# Patient Record
Sex: Male | Born: 1950 | Race: Black or African American | Hispanic: No | Marital: Married | Smoking: Former smoker
Health system: Southern US, Community
[De-identification: ages and names within clinical notes are randomized; demographics above are authoritative.]

## PROBLEM LIST (undated history)

## (undated) DIAGNOSIS — C61 Malignant neoplasm of prostate: Secondary | ICD-10-CM

## (undated) DIAGNOSIS — E785 Hyperlipidemia, unspecified: Secondary | ICD-10-CM

## (undated) DIAGNOSIS — G9332 Myalgic encephalomyelitis/chronic fatigue syndrome: Secondary | ICD-10-CM

## (undated) DIAGNOSIS — J449 Chronic obstructive pulmonary disease, unspecified: Secondary | ICD-10-CM

## (undated) DIAGNOSIS — R5382 Chronic fatigue, unspecified: Secondary | ICD-10-CM

## (undated) DIAGNOSIS — K219 Gastro-esophageal reflux disease without esophagitis: Secondary | ICD-10-CM

## (undated) DIAGNOSIS — I10 Essential (primary) hypertension: Secondary | ICD-10-CM

## (undated) DIAGNOSIS — L918 Other hypertrophic disorders of the skin: Secondary | ICD-10-CM

## (undated) DIAGNOSIS — M1990 Unspecified osteoarthritis, unspecified site: Secondary | ICD-10-CM

## (undated) HISTORY — DX: Gastro-esophageal reflux disease without esophagitis: K21.9

## (undated) HISTORY — PX: PROSTATE SURGERY: SHX751

## (undated) HISTORY — PX: HERNIA REPAIR: SHX51

## (undated) HISTORY — PX: KNEE SURGERY: SHX244

## (undated) HISTORY — DX: Hyperlipidemia, unspecified: E78.5

## (undated) HISTORY — PX: CATARACT EXTRACTION: SUR2

## (undated) HISTORY — DX: Unspecified osteoarthritis, unspecified site: M19.90

## (undated) HISTORY — DX: Myalgic encephalomyelitis/chronic fatigue syndrome: G93.32

## (undated) HISTORY — DX: Other hypertrophic disorders of the skin: L91.8

## (undated) HISTORY — PX: ANTERIOR CRUCIATE LIGAMENT REPAIR: SHX115

## (undated) HISTORY — DX: Chronic obstructive pulmonary disease, unspecified: J44.9

## (undated) HISTORY — DX: Essential (primary) hypertension: I10

## (undated) HISTORY — DX: Chronic fatigue, unspecified: R53.82

---

## 1998-02-05 ENCOUNTER — Encounter: Admission: RE | Admit: 1998-02-05 | Discharge: 1998-05-06 | Payer: Self-pay | Admitting: Radiation Oncology

## 1998-08-31 ENCOUNTER — Ambulatory Visit (HOSPITAL_COMMUNITY): Admission: RE | Admit: 1998-08-31 | Discharge: 1998-08-31 | Payer: Self-pay | Admitting: Gastroenterology

## 1999-09-05 ENCOUNTER — Ambulatory Visit (HOSPITAL_COMMUNITY): Admission: RE | Admit: 1999-09-05 | Discharge: 1999-09-05 | Payer: Self-pay | Admitting: Internal Medicine

## 1999-09-05 ENCOUNTER — Encounter: Payer: Self-pay | Admitting: Internal Medicine

## 2000-03-09 ENCOUNTER — Encounter: Payer: Self-pay | Admitting: Internal Medicine

## 2000-03-09 ENCOUNTER — Ambulatory Visit (HOSPITAL_COMMUNITY): Admission: RE | Admit: 2000-03-09 | Discharge: 2000-03-09 | Payer: Self-pay | Admitting: Internal Medicine

## 2000-03-11 ENCOUNTER — Encounter: Payer: Self-pay | Admitting: Internal Medicine

## 2000-03-11 ENCOUNTER — Encounter: Admission: RE | Admit: 2000-03-11 | Discharge: 2000-03-11 | Payer: Self-pay | Admitting: Internal Medicine

## 2001-01-12 ENCOUNTER — Encounter: Payer: Self-pay | Admitting: Internal Medicine

## 2001-01-12 ENCOUNTER — Encounter: Admission: RE | Admit: 2001-01-12 | Discharge: 2001-01-12 | Payer: Self-pay | Admitting: Internal Medicine

## 2001-03-05 ENCOUNTER — Encounter: Payer: Self-pay | Admitting: Urology

## 2001-03-05 ENCOUNTER — Encounter: Admission: RE | Admit: 2001-03-05 | Discharge: 2001-03-05 | Payer: Self-pay | Admitting: Urology

## 2001-03-12 ENCOUNTER — Ambulatory Visit: Admission: RE | Admit: 2001-03-12 | Discharge: 2001-06-10 | Payer: Self-pay | Admitting: *Deleted

## 2001-03-18 ENCOUNTER — Ambulatory Visit (HOSPITAL_COMMUNITY): Admission: RE | Admit: 2001-03-18 | Discharge: 2001-03-18 | Payer: Self-pay | Admitting: Gastroenterology

## 2001-04-19 ENCOUNTER — Encounter: Payer: Self-pay | Admitting: Urology

## 2001-04-26 ENCOUNTER — Inpatient Hospital Stay (HOSPITAL_COMMUNITY): Admission: RE | Admit: 2001-04-26 | Discharge: 2001-04-29 | Payer: Self-pay | Admitting: Urology

## 2001-04-26 ENCOUNTER — Encounter (INDEPENDENT_AMBULATORY_CARE_PROVIDER_SITE_OTHER): Payer: Self-pay

## 2001-06-23 ENCOUNTER — Encounter: Payer: Self-pay | Admitting: Internal Medicine

## 2001-06-23 ENCOUNTER — Encounter: Admission: RE | Admit: 2001-06-23 | Discharge: 2001-06-23 | Payer: Self-pay | Admitting: Internal Medicine

## 2001-12-13 ENCOUNTER — Encounter: Payer: Self-pay | Admitting: Internal Medicine

## 2001-12-13 ENCOUNTER — Encounter: Admission: RE | Admit: 2001-12-13 | Discharge: 2001-12-13 | Payer: Self-pay | Admitting: Internal Medicine

## 2002-01-26 ENCOUNTER — Encounter: Admission: RE | Admit: 2002-01-26 | Discharge: 2002-01-26 | Payer: Self-pay | Admitting: Internal Medicine

## 2002-01-26 ENCOUNTER — Encounter: Payer: Self-pay | Admitting: Internal Medicine

## 2002-01-26 ENCOUNTER — Encounter: Admission: RE | Admit: 2002-01-26 | Discharge: 2002-01-26 | Payer: Self-pay | Admitting: Otolaryngology

## 2002-01-26 ENCOUNTER — Encounter: Payer: Self-pay | Admitting: Otolaryngology

## 2002-03-14 ENCOUNTER — Encounter: Payer: Self-pay | Admitting: Orthopedic Surgery

## 2002-03-14 ENCOUNTER — Encounter: Admission: RE | Admit: 2002-03-14 | Discharge: 2002-03-14 | Payer: Self-pay | Admitting: Orthopedic Surgery

## 2002-06-08 ENCOUNTER — Emergency Department (HOSPITAL_COMMUNITY): Admission: EM | Admit: 2002-06-08 | Discharge: 2002-06-08 | Payer: Self-pay | Admitting: Emergency Medicine

## 2002-06-08 ENCOUNTER — Encounter: Payer: Self-pay | Admitting: Emergency Medicine

## 2002-12-09 ENCOUNTER — Encounter: Payer: Self-pay | Admitting: Internal Medicine

## 2002-12-09 ENCOUNTER — Encounter: Admission: RE | Admit: 2002-12-09 | Discharge: 2002-12-09 | Payer: Self-pay | Admitting: Internal Medicine

## 2003-01-02 ENCOUNTER — Emergency Department (HOSPITAL_COMMUNITY): Admission: AD | Admit: 2003-01-02 | Discharge: 2003-01-02 | Payer: Self-pay | Admitting: Family Medicine

## 2003-05-18 ENCOUNTER — Encounter: Admission: RE | Admit: 2003-05-18 | Discharge: 2003-05-18 | Payer: Self-pay | Admitting: Internal Medicine

## 2003-07-26 ENCOUNTER — Emergency Department (HOSPITAL_COMMUNITY): Admission: EM | Admit: 2003-07-26 | Discharge: 2003-07-26 | Payer: Self-pay | Admitting: Family Medicine

## 2003-09-01 ENCOUNTER — Encounter: Admission: RE | Admit: 2003-09-01 | Discharge: 2003-09-01 | Payer: Self-pay | Admitting: Internal Medicine

## 2004-08-15 ENCOUNTER — Observation Stay (HOSPITAL_COMMUNITY): Admission: EM | Admit: 2004-08-15 | Discharge: 2004-08-16 | Payer: Self-pay | Admitting: Emergency Medicine

## 2004-09-12 ENCOUNTER — Inpatient Hospital Stay (HOSPITAL_BASED_OUTPATIENT_CLINIC_OR_DEPARTMENT_OTHER): Admission: RE | Admit: 2004-09-12 | Discharge: 2004-09-12 | Payer: Self-pay | Admitting: Cardiology

## 2005-01-24 ENCOUNTER — Encounter: Admission: RE | Admit: 2005-01-24 | Discharge: 2005-01-24 | Payer: Self-pay | Admitting: Internal Medicine

## 2005-05-15 ENCOUNTER — Encounter: Admission: RE | Admit: 2005-05-15 | Discharge: 2005-05-15 | Payer: Self-pay | Admitting: Sports Medicine

## 2005-08-11 ENCOUNTER — Encounter: Admission: RE | Admit: 2005-08-11 | Discharge: 2005-08-11 | Payer: Self-pay | Admitting: Internal Medicine

## 2005-09-11 ENCOUNTER — Ambulatory Visit (HOSPITAL_COMMUNITY): Admission: RE | Admit: 2005-09-11 | Discharge: 2005-09-11 | Payer: Self-pay | Admitting: Urology

## 2005-10-09 ENCOUNTER — Encounter (HOSPITAL_COMMUNITY): Admission: RE | Admit: 2005-10-09 | Discharge: 2005-12-23 | Payer: Self-pay | Admitting: Urology

## 2005-11-25 ENCOUNTER — Encounter: Admission: RE | Admit: 2005-11-25 | Discharge: 2005-11-25 | Payer: Self-pay | Admitting: Internal Medicine

## 2006-07-13 ENCOUNTER — Encounter: Admission: RE | Admit: 2006-07-13 | Discharge: 2006-07-13 | Payer: Self-pay | Admitting: Gastroenterology

## 2007-03-30 ENCOUNTER — Emergency Department (HOSPITAL_COMMUNITY): Admission: EM | Admit: 2007-03-30 | Discharge: 2007-03-30 | Payer: Self-pay | Admitting: Emergency Medicine

## 2007-04-12 ENCOUNTER — Ambulatory Visit (HOSPITAL_COMMUNITY): Admission: RE | Admit: 2007-04-12 | Discharge: 2007-04-12 | Payer: Self-pay | Admitting: Cardiology

## 2007-04-13 ENCOUNTER — Emergency Department (HOSPITAL_COMMUNITY): Admission: EM | Admit: 2007-04-13 | Discharge: 2007-04-13 | Payer: Self-pay | Admitting: Family Medicine

## 2007-04-15 ENCOUNTER — Encounter: Admission: RE | Admit: 2007-04-15 | Discharge: 2007-04-15 | Payer: Self-pay | Admitting: Internal Medicine

## 2008-01-13 ENCOUNTER — Encounter (HOSPITAL_COMMUNITY): Admission: RE | Admit: 2008-01-13 | Discharge: 2008-03-30 | Payer: Self-pay | Admitting: Urology

## 2008-04-12 ENCOUNTER — Ambulatory Visit: Payer: Self-pay | Admitting: Vascular Surgery

## 2008-04-14 ENCOUNTER — Encounter: Admission: RE | Admit: 2008-04-14 | Discharge: 2008-04-14 | Payer: Self-pay | Admitting: Internal Medicine

## 2008-04-30 ENCOUNTER — Emergency Department (HOSPITAL_COMMUNITY): Admission: EM | Admit: 2008-04-30 | Discharge: 2008-04-30 | Payer: Self-pay | Admitting: Emergency Medicine

## 2008-10-06 ENCOUNTER — Emergency Department (HOSPITAL_COMMUNITY): Admission: EM | Admit: 2008-10-06 | Discharge: 2008-10-07 | Payer: Self-pay | Admitting: Emergency Medicine

## 2008-10-07 ENCOUNTER — Ambulatory Visit: Payer: Self-pay | Admitting: Diagnostic Radiology

## 2008-10-07 ENCOUNTER — Emergency Department (HOSPITAL_BASED_OUTPATIENT_CLINIC_OR_DEPARTMENT_OTHER): Admission: EM | Admit: 2008-10-07 | Discharge: 2008-10-07 | Payer: Self-pay | Admitting: Emergency Medicine

## 2008-11-17 ENCOUNTER — Encounter: Admission: RE | Admit: 2008-11-17 | Discharge: 2008-11-17 | Payer: Self-pay | Admitting: Internal Medicine

## 2009-02-05 ENCOUNTER — Ambulatory Visit (HOSPITAL_COMMUNITY): Admission: RE | Admit: 2009-02-05 | Discharge: 2009-02-06 | Payer: Self-pay | Admitting: General Surgery

## 2009-05-07 ENCOUNTER — Encounter: Admission: RE | Admit: 2009-05-07 | Discharge: 2009-05-07 | Payer: Self-pay | Admitting: Internal Medicine

## 2009-06-05 ENCOUNTER — Encounter: Admission: RE | Admit: 2009-06-05 | Discharge: 2009-06-05 | Payer: Self-pay | Admitting: Internal Medicine

## 2009-06-26 ENCOUNTER — Ambulatory Visit: Payer: Self-pay | Admitting: Vascular Surgery

## 2009-06-26 ENCOUNTER — Encounter: Payer: Self-pay | Admitting: Internal Medicine

## 2009-06-26 ENCOUNTER — Ambulatory Visit (HOSPITAL_COMMUNITY): Admission: RE | Admit: 2009-06-26 | Discharge: 2009-06-26 | Payer: Self-pay | Admitting: Internal Medicine

## 2009-11-21 ENCOUNTER — Emergency Department (HOSPITAL_COMMUNITY): Admission: EM | Admit: 2009-11-21 | Discharge: 2009-11-22 | Payer: Self-pay | Admitting: Emergency Medicine

## 2010-02-06 ENCOUNTER — Encounter: Admission: RE | Admit: 2010-02-06 | Discharge: 2010-02-06 | Payer: Self-pay | Admitting: Otolaryngology

## 2010-03-14 ENCOUNTER — Encounter
Admission: RE | Admit: 2010-03-14 | Discharge: 2010-03-14 | Payer: Self-pay | Source: Home / Self Care | Attending: Internal Medicine | Admitting: Internal Medicine

## 2010-06-14 LAB — COMPREHENSIVE METABOLIC PANEL
Albumin: 4 g/dL (ref 3.5–5.2)
Alkaline Phosphatase: 78 U/L (ref 39–117)
BUN: 17 mg/dL (ref 6–23)
GFR calc Af Amer: >60 mL/min (ref >60)
Potassium: 3.6 mEq/L (ref 3.5–5.1)
Sodium: 139 mEq/L (ref 135–145)
Total Protein: 7.5 g/dL (ref 6.0–8.3)

## 2010-06-14 LAB — URINALYSIS, ROUTINE W REFLEX MICROSCOPIC
Glucose, UA: NEGATIVE mg/dL
Ketones, ur: NEGATIVE mg/dL
pH: 6.5 (ref 5.0–8.0)

## 2010-06-14 LAB — DIFFERENTIAL
Basophils Relative: 0 % (ref 0–1)
Monocytes Absolute: 0.5 10*3/uL (ref 0.1–1.0)
Monocytes Relative: 8 % (ref 3–12)
Neutro Abs: 5.2 10*3/uL (ref 1.7–7.7)

## 2010-06-14 LAB — CBC
Platelets: 212 10*3/uL (ref 150–400)
RDW: 13.2 % (ref 11.5–15.5)
WBC: 6.2 10*3/uL (ref 4.0–10.5)

## 2010-07-03 LAB — CBC
HCT: 43 % (ref 39.0–52.0)
Hemoglobin: 15.1 g/dL (ref 13.0–17.0)
RBC: 4.72 MIL/uL (ref 4.22–5.81)
WBC: 3.7 10*3/uL — ABNORMAL LOW (ref 4.0–10.5)

## 2010-07-03 LAB — COMPREHENSIVE METABOLIC PANEL
Alkaline Phosphatase: 78 U/L (ref 39–117)
BUN: 11 mg/dL (ref 6–23)
CO2: 30 mEq/L (ref 19–32)
Chloride: 104 mEq/L (ref 96–112)
Glucose, Bld: 100 mg/dL — ABNORMAL HIGH (ref 70–99)
Potassium: 4.1 mEq/L (ref 3.5–5.1)
Total Bilirubin: 0.7 mg/dL (ref 0.3–1.2)

## 2010-07-03 LAB — DIFFERENTIAL
Basophils Absolute: 0 10*3/uL (ref 0.0–0.1)
Eosinophils Absolute: 0.2 10*3/uL (ref 0.0–0.7)
Lymphocytes Relative: 28 % (ref 12–46)
Monocytes Absolute: 0.3 10*3/uL (ref 0.1–1.0)
Neutrophils Relative %: 56 % (ref 43–77)

## 2010-07-03 LAB — PROTIME-INR
INR: 0.95 (ref 0.00–1.49)
Prothrombin Time: 12.6 seconds (ref 11.6–15.2)

## 2010-07-03 LAB — APTT: aPTT: 28 seconds (ref 24–37)

## 2010-07-07 LAB — URINALYSIS, ROUTINE W REFLEX MICROSCOPIC
Bilirubin Urine: NEGATIVE
Ketones, ur: NEGATIVE mg/dL
Nitrite: NEGATIVE
Protein, ur: NEGATIVE mg/dL
pH: 7 (ref 5.0–8.0)

## 2010-07-07 LAB — DIFFERENTIAL
Basophils Absolute: 0 10*3/uL (ref 0.0–0.1)
Eosinophils Relative: 1 % (ref 0–5)
Lymphocytes Relative: 12 % (ref 12–46)
Lymphs Abs: 0.6 10*3/uL — ABNORMAL LOW (ref 0.7–4.0)
Neutro Abs: 4.4 10*3/uL (ref 1.7–7.7)
Neutrophils Relative %: 82 % — ABNORMAL HIGH (ref 43–77)

## 2010-07-07 LAB — CBC
Platelets: 238 10*3/uL (ref 150–400)
RDW: 12.2 % (ref 11.5–15.5)
WBC: 5.3 10*3/uL (ref 4.0–10.5)

## 2010-07-07 LAB — BASIC METABOLIC PANEL
BUN: 11 mg/dL (ref 6–23)
Calcium: 8.9 mg/dL (ref 8.4–10.5)
Creatinine, Ser: 0.8 mg/dL (ref 0.4–1.5)
GFR calc non Af Amer: >60 mL/min (ref >60)
Glucose, Bld: 126 mg/dL — ABNORMAL HIGH (ref 70–99)

## 2010-07-07 LAB — POCT CARDIAC MARKERS
CKMB, poc: 1.4 ng/mL (ref 1.0–8.0)
Myoglobin, poc: 63 ng/mL (ref 12–200)
Troponin i, poc: <0.05 ng/mL (ref 0.00–0.09)

## 2010-07-10 ENCOUNTER — Encounter: Payer: Self-pay | Admitting: Internal Medicine

## 2010-08-16 NOTE — Op Note (Signed)
[facility]  Patient:    Curtis Bell, Curtis Bell Visit Number: [id number] MRN: [id number]          Service Type: SUR Location: 3W 0376 01 Attending Physician:  Laqueta Jean Dictated by:   Vonzell Schlatter Patsi Sears, M.D. Proc. Date: 04/26/01 Admit Date:  04/26/2001   CC:         Curtis Bell, M.D.  Lindaann Slough, M.D.   Operative Report  PREOPERATIVE DIAGNOSIS:  Adenocarcinoma of the prostate.  POSTOPERATIVE DIAGNOSIS:  Adenocarcinoma of the prostate.  OPERATION PERFORMED:  Radical retropubic prostatectomy (nerve sparing type).  SURGEON:  Sigmund I. Patsi Sears, M.D.  FIRST ASSISTANT:  Lindaann Slough, M.D.  PREPARATION:  After appropriate preanesthesia, the patient was brought to the operating room, placed upon the operating table in dorsal supine position where general endotracheal anesthesia was introduced. He remained in the supine position, where the abdomen and pubis were prepped with Betadine solution and draped in the usual fashion.  REVIEW OF HISTORY:  Curtis Bell is a 60 year old black male, with a history of PSA of 7.84, ______ 45.18 cc, and biopsy of the prostate showing adenocarcinoma of the prostate, Gleason 3 + 4 (on the left side). The patient could not tolerate discomfort of biopsies (despite local anesthesia), and therefore biopsies on the right side were not obtained. The patient has negative bone scan, negative CT scan, and has selected radical retropubic prostatectomy, nerve sparing type as treatment of choice.  DESCRIPTION OF PROCEDURE:  A 20 cm midline was made, subcutaneous tissue was dissected with the electrosurgical unit. The right and left pelvic gutters are dissected, and lymph node packages from the right and left obturator fossa are dissected, by dissecting tissue from the external iliac vein, lateralward to the pelvic side wall, inferiorly to the obturator nerve, and distalward to the femoral canal. On the  right side, the tissue was somewhat stuck, and on the left side, a large lymph node was identified. This tissue was dissected and sent to the laboratory for evaluation by permanent section analysis.  The retropubic fascia was then dissected with sharp and blunt dissection, and the puboprostatic ligaments were identified and sharply taken down. Using the Hohenfelner clamp, the dorsal vein complex was dissected from the urethra, and two #0 Vicryl sutures were placed. The Stamey tractor was then placed and a #0 Vicryl suture ligature was placed through the distal portion of the venous vasculature, and then the Hohenfelner was placed, and the suture brought through and ligated distalward. Using the [facility], bladder neck was then incised at the level of the urethra. The large Kelly clamp was then used to clamp the Foley catheter, and the catheter was brought into the wound and used as a traction device. The posterior portion of the Foley catheter was then incised. A plane was then dissected ______, and the vas was identified bilaterally and dissected down to show the seminal vesicles. The vas was ligated on the right side with #0 chromic catgut suture, on the left side with #0 Prolene catgut suture. The seminal vesicles were then dissected bilaterally and clipped at the base and cut.  The bladder neck was then incised and dissected with a Vanderbilt clamp, and after dissection, incised. The prostatectomy was then completed and the tissue removed and a portion of the posterior bladder neck was sent for separate analysis. Dissection was somewhat difficult to do because of grandular response on the left side. It was felt that the nerves had been spared, however, bilaterally.  The bladder neck was then tightened with a single 3-0 Vicryl suture in a tennis racquet fashion at 6 oclock, and following this, the bladder neck was everted with 4-0 Vicryl suture. A 6 suture anastomosis was then  accomplished with 2-0 monocryl suture. Sutures were placed at the 6 oclock, 8 oclock, 4 oclock, 10 oclock, 2 oclock, and 12 oclock positions. Following suture placement, the 22 Foley catheter was placed in the bladder with 20 cc. The bladder neck sutures were then placed prior to inflating the balloon, and following placement, the balloon was inflated. The ligatures were then accomplished, and irrigation was accomplished, which revealed some clots which were irrigated free from the bladder, but no leakage was noted. A separate Blake drain was placed through the left pelvis, and sutured in place with 3-0 nylon suture.  The wound was injected with Marcaine solution, and following this, the patient underwent closure with a single #1 PDS suture in running fashion. The skin was closed with a skin stapler. A sterile dressing was applied, the patient was awakened and taken to the recovery room in good condition. Dictated by:   Vonzell Schlatter Patsi Sears, M.D. Attending Physician:  Laqueta Jean DD:  04/26/01 TD:  04/26/01 Job: [id number]

## 2010-08-16 NOTE — Discharge Summary (Signed)
[facility]  Patient:    Curtis Bell, Curtis Bell Visit Number: [id number] MRN: [id number]          Service Type: SUR Location: 3W 0376 01 Attending Physician:  Laqueta Jean Dictated by:   Vonzell Schlatter Curtis Bell, M.D. Admit Date:  04/26/2001 Discharge Date: 04/29/2001                             Discharge Summary  FINAL DIAGNOSIS:  Adenocarcinoma of the prostate, Gleason score 7 ( 3 + 4), stage T2C, PN0, PMX.  OPERATION:  On January 27, radical retropubic prostatectomy (nerve-sparing type).  HISTORY OF PRESENT ILLNESS:  Curtis Bell is a 60 year old married black male, admitted to the operating room for radical retropubic prostatectomy for a Gleason 7 adenocarcinoma of the prostate.  The patient has a urologic history of Peyronies disease, epididymitis, and bladder neck incision in 1997, with negative prostate biopsies.  More recently, the PSA was noted to be 7.84, volume of 45.18 cc, density of 0.17, with biopsies on the left side only because of pain during the prostate biopsy process (despite local anesthetic).  Pathologic evaluation showed Gleason 3 + 4 adenocarcinoma of the prostate, and with negative bone scan and negative CT scan, the patient has elected to have radical retropubic prostatectomy, nerve sparing type.  PAST MEDICAL HISTORY:  Noncontributory.   Alcohol - none.  Tobacco - none.  ALLERGIES:  None.  MEDICATIONS: 1. Lopressor 50 mg one-half tablet b.i.d. 2. Norvasc 2.5 mg per day. 3. Celebrex 200 mg per day. 4. Nexium one per day. 5. Nasonex p.r.n. 6. Multivitamins.  ADMISSION PHYSICAL EXAMINATION:  GENERAL:  Thin, well-developed black male in no acute distress.  VITAL SIGNS:  Temperature 97.6, heart rate 74, respiratory rate 14, blood pressure 128/84.  ADMISSION LABORATORY DATA:  The patient has an admission hemoglobin of 17.2, hematocrit 50.2, with BUN of 8 and creatinine 0.9.  HOSPITAL COURSE:  On the day of  admission, the patient underwent radical retropubic prostatectomy, nerve-sparing type.  Pathologic evaluation shows PT2C, PN0, PMX, with 0 of 9 lymph nodes found with disesae.  The patient had tumor coming extremely close to the left apical margin.  The right lobe of the prostate shows only a rare microscopic focus.  In the vast majority of the tumor, it was on the left side of the prostate.  The patient did well following surgery.  A Jackson-Pratt catheter was removed on the day of discharge.  He was discharged on pain medication, antibiotic. He will return to the office for staple removal.  He is discharged following the bowel movement, in stable condition. Dictated by:   Vonzell Schlatter Curtis Bell, M.D. Attending Physician:  Laqueta Jean DD:  04/29/01 TD:  04/29/01 Job: [id number]

## 2010-08-16 NOTE — H&P (Signed)
[facility]  Patient:    Curtis Bell, Curtis Bell Visit Number: [id number] MRN: [id number]          Service Type: SUR Location: 1S X007 01 Attending Physician:  Laqueta Jean Dictated by:   Vonzell Schlatter Patsi Sears, M.D. Admit Date:  04/26/2001   CC:         Eric L. August Saucer, M.D.   History and Physical  HISTORY OF PRESENT ILLNESS:  The patient is a 60 year old married male, who was admitted via the operating room today for radical retropubic prostatectomy for Gleason 7 adenocarcinoma of the prostate.  The patient has a urologic history of perinis disease, epididymitis, bladder, neck incision 1997, with negative prostate biopsies.  More recently, his PSA was noted to be 7.84, with a volume of 45.18 cc. and a density of 0.17. Prostate biopsied were obtained from the left side only because of pain during prostate biopsy, despite local anesthetic.  Pathologic evaluation showed Gleason score 3 plus 4 (7) adenocarcinoma and 25% of the left apex biopsies, the left central biopsies, and the left lateral biopsies.  The bone scan was negative, and CT showed a right lower pole renal cyst only. His prostate was noted to be moderately enlarged, measuring 45 cc., with prominent seminal vesicles but no evidence of any local tumor extension.  The bladder was slightly thick walled suggesting some outlet obstruction.  Following along the discussion with regard to all types of therapy, the patient elected to have radical retropubic prostatectomy and was admitted via the operating room for that today.  PAST MEDICAL HISTORY:  Otherwise noncontributory.  The alcohol was none. Tobacco was none.  ALLERGIES:  No known drug allergies.  MEDICATIONS:  None.  ADMISSION PHYSICAL EXAMINATION:  GENERAL:  Shows a well-developed black male in no acute distress.  HEENT:  PERRL.  EOM full.  NECK:  Supple.  Nontender.  No nodes.  CHEST:  Clear to percussion and  auscultation.  ABDOMEN:  Soft, plus bowel sounds without organomegaly or masses.  GENITALIA:  Normal male, external genitalia.  RECTAL:  Shows a 3+ lobular benign prostate.  EXTREMITIES: without cyanosis or edema.  NEUROLOGIC:  Physiologic.  VITAL SIGNS:  Within normal limits.  ADMITTING IMPRESSION:  Adenocarcinoma of the prostate.  PLAN:  Admit OR radical retropubic prostatectomy. Dictated by:   Vonzell Schlatter Patsi Sears, M.D. Attending Physician:  Laqueta Jean DD:  04/26/01 TD:  04/26/01 Job: [id number]

## 2010-08-16 NOTE — Discharge Summary (Signed)
NAMESAAGAR, Curtis Bell               ACCOUNT NO.:  [id number]   MEDICAL RECORD NO.:  [id number]          PATIENT TYPE:  INP   LOCATION:  2033                         FACILITY:  [facility]   PHYSICIAN:  Eduardo Osier. Sharyn Lull, M.D. DATE OF BIRTH:  03-May-1950   DATE OF ADMISSION:  08/14/2004  DATE OF DISCHARGE:  08/16/2004                                 DISCHARGE SUMMARY   ADMISSION DIAGNOSES:  1.  New onset angina, rule out myocardial infarction.  2.  Hypertension.  3.  Elevated blood sugar, rule out diabetes mellitus.  4.  Remote history of tobacco abuse.   FINAL DIAGNOSES:  1.  Status post chest pain with myocardial infarction ruled out. Negative      stress Myoview with ejection fraction of 53%.  2.  Elevated blood sugar, rule out diabetes mellitus.  3.  Hypercholesterolemia.  4.  Remote history of tobacco abuse.  5.  Hypertension.   DISCHARGE MEDICATIONS:  1.  Norvasc 5 mg 1 tablet daily.  2.  Nexium 40 mg 1 capsule daily half hour before breakfast.  3.  Baby aspirin 81 mg 2 tablets daily.  4.  Nitrostat 0.4 mg sublingual use as directed.  5.  Lipitor 10 mg 1 tablet p.o. daily.   ACTIVITY:  As tolerated.   DIET:  Low salt, low cholesterol, 1800 calorie ADA diet. The patient has  been advised to avoid sweets.   FOLLOW UP:  Follow up with me in one week and Dr. Minerva Areola L. Dean in two weeks.   CONDITION ON DISCHARGE:  Stable.   BRIEF HISTORY:  Curtis Bell is a 60 year old black male with past medical  history significant for hypertension, sinus problem, GERD. He came to the ER  via EMS complaining of recurrent retrosternal chest pain off and on since  yesterday afternoon. The chest pain was described as pressure, localized  grade 8 over 10 lasting a few minutes without associated symptoms of nausea,  vomiting, or diaphoresis. Denies any palpitation, lightheadedness or  syncope. Denies any shortness of breath. 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   An EKG done in the ER showed normal sinus rhythm with minor ST and T-wave  changes in the lateral leads which were noted also in prior EKGs. There were  no new acute ischemic changes noted. The patient denies any relational chest  pain to food, breathing, or movement.   PAST MEDICAL HISTORY:  As above.   PAST SURGICAL HISTORY:  1.  He had right knee surgery in the past.  2.  Prostatectomy for CA of the prostate in the past.   SOCIAL HISTORY:  He is married and has two children. Smoked one pack per day  for 10+ years. Quit in 1984. No history of drug abuse. 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   FAMILY HISTORY:  Negative for coronary artery disease.   MEDICATIONS:  Norvasc, Nexium, and questionable medication for his lung  problem.  ALLERGIES:  No known drug allergies.   PHYSICAL EXAMINATION:  GENERAL:  He was alert, oriented x 3 in no acute  distress.  VITAL SIGNS:  Blood pressure is 134/84, pulse was 59.  HEENT:  Conjunctivae was pink.  NECK:  Supple. No JVD and no bruit.  LUNGS:  Clear to auscultation without rhonchi or rales.  CARDIOVASCULAR:  S1 and S2 was normal. There was no S3, gallop or murmur.  ABDOMEN:  Soft. Bowel sounds were present. Nontender.  EXTREMITIES:  There is no clubbing, cyanosis, or edema.   LABORATORY DATA:  Cholesterol was 175, LDL was 109, HDL was 37. His CPK was  367, MB 6.5, relative index 1.8. Second set of CPK 285, MB 5.0, relative  index 1.8. Third set of CPK 250, MB 3.0, relative index was 1.2. Troponin I  three sets were negative, 0.2, 101, and 0.02. His fasting blood sugar was  113; fasting repeat blood sugar was 102. This morning, blood sugar was 118  which is slightly elevated. Sodium was 136, potassium 3.6, bicarbonate 25,  BUN was 9, creatinine 0.8. Calcium was 8.5.   Stress Myoview done today showed no evidence of ischemia with EF of 56%.   Chest x-ray showed no acute cardiopulmonary disease.   HOSPITAL COURSE:  The patient was admitted to telemetry unit. MI was ruled  out by serial enzymes and EKG. The patient was started on IV nitrates and  heparin and low dose beta blocker. The patient has episode of bradycardia  with heart rate in the 40s. Beta blockers were stopped. The patient's heart  rate remained in the 60s after stopping the beta blocker. The patient  underwent stress Myoview, exercised for seven minutes on Bruce protocol. No  chest pain was reported. Peak heart rate was 154. Peak blood pressure was  213/74. EKG showed sinus bradycardia with slightly upsloping, ST depression  in inferolateral leads but in the recovery there were mild ST depression,  horizontal and downgoing in the recovery phase which reverted towards the  baseline. Myoview results showed no evidence of reversible ischemia with EF  of 56%. The patient did not have any further episodes of chest pain during  the hospital  stay. The patient will be discharged home on the above medications and will  be followed up in my office in one week and Dr. Minerva Areola L. Dean in two weeks.  The patient will be monitored for his mildly elevated blood sugar as  outpatient and we will get hemoglobin A1C and glucose tolerance test as  appropriate.      MNH/MEDQ  D:  08/16/2004  T:  08/17/2004  Job:  [id number]   cc:   [name], M.D.  [address] [address]  [place]  [place] [address]  Fax: [phone]

## 2010-08-16 NOTE — Procedures (Signed)
[facility]  Patient:    Curtis Bell Visit Number: [id number] MRN: [id number]          Service Type: END Location: ENDO Attending Physician:  Nelda Marseille Dictated by:   Petra Kuba, M.D. Proc. Date: 03/18/01 Admit Date:  03/18/2001 Discharge Date: 03/18/2001   CC:         [name], M.D.  Sigmund I. Patsi Sears, M.D.   Procedure Report  PROCEDURE:  Colonoscopy.  INDICATION:  Patient with persistent anorectal complaints, probably due to hemorrhoids but with a recent diagnosis of prostate cancer.  Want to rule out any rectal abnormality.  Consent was signed after risks, benefits, methods and options thoroughly discussed in the office.  MEDICATIONS:  Demerol 80, Versed 10.  DESCRIPTION OF PROCEDURE:  Rectal inspection was pertinent for external hemorrhoids, small.  Digital exam was negative.  The video colonoscope was inserted.  The rectum was thoroughly evaluated on straight and retroflexed visualization and other than some small internal hemorrhoids, no abnormalities were seen.  Anorectal pull-through confirmed the above.  The scope was then straightened and easily advanced around the colon to the cecum.  This did require some abdominal pressure and rolling him on his back.  The cecum was identified by the appendiceal orifice and the ileocecal valve.  No abnormalities were seen on insertion.  The scope was slowly withdrawn.  The prep was adequate.  There was some liquid stool that required washing and suctioning.  On slow withdrawal through the colon, no polypoid lesions, masses, or diverticula were seen as we slowly withdrew back to the rectum. Again anorectal pull-through, retroflexion, and straight visualization were all normal except for the hemorrhoids.  The scope was straightened and readvanced a short ways up the sigmoid.  Air was suctioned and the scope removed.  The patient tolerated the procedure well.  There was no  obvious immediate complication.  ENDOSCOPIC DIAGNOSES: 1. Internal/external hemorrhoids. 2. Otherwise within normal limits to the cecum.  PLAN:  Hemorrhoidal therapy p.r.n. with the use of suppositories, creams, or foams.  Okay from my standpoint for a prostate surgery.  Be happy to see back p.r.n.  Otherwise repeat screening in five years, but yearly rectals and guaiacs per Dr. August Saucer. Dictated by:   Petra Kuba, M.D. Attending Physician:  Nelda Marseille DD:  03/18/01 TD:  03/20/01 Job: [id number]

## 2010-08-16 NOTE — Cardiovascular Report (Signed)
NAMEOMRI, BERTRAN               ACCOUNT NO.:  [id number]   MEDICAL RECORD NO.:  [id number]          PATIENT TYPE:  OIB   LOCATION:  6501                         FACILITY:  [facility]   PHYSICIAN:  Mohan N. Sharyn Lull, M.D. DATE OF BIRTH:  1950-10-27   DATE OF PROCEDURE:  09/12/2004  DATE OF DISCHARGE:                              CARDIAC CATHETERIZATION   PROCEDURE:  Left cardiac cath with selective left and right coronary  angiograph via LV graft via right groin using Judkins technique.   INDICATIONS FOR PROCEDURE:  Mr. Goodrich is a 60 year old black male with past  medical history significant for hypertension, glucose intolerance,  hypercholesteremia, remote history of tobacco abuse was recently discharged  from hospital with complaints of recurrent precordial chest achiness without  associated symptoms of nausea, vomiting, diaphoresis. Also complains of  occasional retrosternal chest pain described as pressure. Denies any  palpitation, lightheadedness or syncope. Denies PND, orthopnea, leg  swelling. The patient denies relation of chest pain to breathing, food, or  movement. The patient had stress Myoview done recently which showed minor  EKG changes in the recovery phase but Myoview scan was negative for ischemia  with EF of 56%. Due to recurrent chest pain and mildly positive Myoview by  EKG criteria, I discussed with the patient regarding left cath, its risks  and benefits and consented for the procedure.   DESCRIPTION OF PROCEDURE:  After obtaining the informed consent, the patient  was brought to the cath lab and was placed on fluoroscopy table. The right  groin was prepped and draped in the usual fashion. 2% Xylocaine was used for  local anesthesia in the right groin. With the help of a thin-wall needle, a  4-French arterial sheath was placed. The sheath was aspirated and flushed.  Next, 4-French left Judkins catheter was advanced over the wire under  fluoroscopic guidance up to  the ascending aorta. The wire was pulled out,  the catheter was aspirated and connected to the manifold. The catheter was  further advanced and engaged into left coronary ostium. Multiple views of  the left system were taken. Next the catheter was disengaged and was pulled  out over the wire and was replaced with 4-French right Judkins catheter  which was advanced over the wire under fluoroscopic guidance up to the  ascending aorta. Wire was pulled out, the catheter was aspirated and  connected to the manifold. The catheter was further advanced and engaged  into the right coronary ostium. Multiple views of the right system were  taken. Next the catheter was disengaged and was pulled out over the wire and  was replaced with 4-French pigtail catheter which was advanced over the wire  under fluoroscopic guidance up to the ascending aorta. Wire was pulled out,  the catheter was aspirated and connected to the manifold. The catheter was  further advanced across aortic valve into the LV. The LV pressures were  recorded. Next, LV graft was done in 30 degree RAO position. Post  angiographic pressures were recorded from LV and then pullback pressures  were recorded from the aorta. There was no gradient  across aortic valve.  Next a pigtail catheter was pulled out over the wire, sheaths were aspirated  and flushed.   FINDINGS:  LV showed poor LV systolic function, EF of 50-55%. The left main  was patent. LAD has 10-15% mid stenosis, diagonal I was small which was  patent. Diagonal II was moderate size which was patent. The left  circumflex was patent. OM1 and OM-2 were small which were patent. OM III was  large which was patent. RCA was patent. The patient tolerated procedure  well. There were no complications. The patient was transferred to recovery  room in stable condition.       MNH/MEDQ  D:  09/12/2004  T:  09/12/2004  Job:  [id number]   cc:   [name], M.D.  [address] [address]  [place]   [place] [address]  Fax: [phone]

## 2010-10-10 ENCOUNTER — Other Ambulatory Visit: Payer: Self-pay | Admitting: Internal Medicine

## 2010-10-10 DIAGNOSIS — M542 Cervicalgia: Secondary | ICD-10-CM

## 2010-10-11 ENCOUNTER — Ambulatory Visit
Admission: RE | Admit: 2010-10-11 | Discharge: 2010-10-11 | Disposition: A | Discharge status: Home / Self Care | Payer: 59 | Source: Ambulatory Visit | Attending: Internal Medicine | Admitting: Internal Medicine

## 2010-10-11 DIAGNOSIS — M542 Cervicalgia: Secondary | ICD-10-CM

## 2010-10-15 ENCOUNTER — Telehealth (INDEPENDENT_AMBULATORY_CARE_PROVIDER_SITE_OTHER): Payer: Self-pay | Admitting: General Surgery

## 2010-10-15 NOTE — Telephone Encounter (Signed)
Called the pt back and after questioning him about his symptoms advised him to call his PCP to schedule and exam.

## 2011-01-03 LAB — POCT CARDIAC MARKERS
CKMB, poc: 1.7
Myoglobin, poc: 101
Operator id: 4533
Troponin i, poc: <0.05

## 2011-01-03 LAB — DIFFERENTIAL
Basophils Absolute: 0
Eosinophils Absolute: 0.1
Eosinophils Relative: 2

## 2011-01-03 LAB — BASIC METABOLIC PANEL
BUN: 10
Chloride: 105
Creatinine, Ser: 0.94
GFR calc non Af Amer: >60
Glucose, Bld: 134 — ABNORMAL HIGH
Potassium: 3.9

## 2011-01-03 LAB — CBC
HCT: 48.2
MCV: 87.6
Platelets: 266
RDW: 13

## 2011-01-03 LAB — HEPATIC FUNCTION PANEL
Indirect Bilirubin: 0.8
Total Protein: 6.7

## 2011-01-12 DIAGNOSIS — I10 Essential (primary) hypertension: Secondary | ICD-10-CM | POA: Insufficient documentation

## 2011-05-07 ENCOUNTER — Other Ambulatory Visit: Payer: Self-pay | Admitting: Internal Medicine

## 2011-05-07 DIAGNOSIS — R1011 Right upper quadrant pain: Secondary | ICD-10-CM

## 2011-05-14 ENCOUNTER — Ambulatory Visit
Admission: RE | Admit: 2011-05-14 | Discharge: 2011-05-14 | Disposition: A | Discharge status: Home / Self Care | Payer: 59 | Source: Ambulatory Visit | Attending: Internal Medicine | Admitting: Internal Medicine

## 2011-05-14 DIAGNOSIS — R1011 Right upper quadrant pain: Secondary | ICD-10-CM

## 2011-05-14 MED ORDER — IOHEXOL 300 MG/ML  SOLN
125.0000 mL | Freq: Once | INTRAMUSCULAR | Status: AC | PRN
  Administered 2011-05-14: 125 mL via INTRAVENOUS

## 2011-09-27 ENCOUNTER — Other Ambulatory Visit: Payer: Self-pay | Admitting: Internal Medicine

## 2011-10-09 ENCOUNTER — Other Ambulatory Visit (INDEPENDENT_AMBULATORY_CARE_PROVIDER_SITE_OTHER): Payer: Self-pay | Admitting: Otolaryngology

## 2011-10-09 DIAGNOSIS — J0100 Acute maxillary sinusitis, unspecified: Secondary | ICD-10-CM

## 2011-10-14 ENCOUNTER — Other Ambulatory Visit (INDEPENDENT_AMBULATORY_CARE_PROVIDER_SITE_OTHER): Payer: Self-pay | Admitting: Otolaryngology

## 2011-10-14 ENCOUNTER — Ambulatory Visit
Admission: RE | Admit: 2011-10-14 | Discharge: 2011-10-14 | Disposition: A | Discharge status: Home / Self Care | Payer: 59 | Source: Ambulatory Visit | Attending: Otolaryngology | Admitting: Otolaryngology

## 2011-10-14 DIAGNOSIS — J0100 Acute maxillary sinusitis, unspecified: Secondary | ICD-10-CM

## 2011-10-14 MED ORDER — IOHEXOL 300 MG/ML  SOLN: 100.0000 mL | Freq: Once | INTRAMUSCULAR | Status: AC | PRN

## 2012-02-09 ENCOUNTER — Ambulatory Visit: Payer: 59

## 2012-04-08 ENCOUNTER — Encounter (HOSPITAL_BASED_OUTPATIENT_CLINIC_OR_DEPARTMENT_OTHER): Payer: Self-pay

## 2012-04-08 ENCOUNTER — Emergency Department (HOSPITAL_BASED_OUTPATIENT_CLINIC_OR_DEPARTMENT_OTHER)
Admission: EM | Admit: 2012-04-08 | Discharge: 2012-04-08 | Disposition: A | Discharge status: Home / Self Care | Payer: 59 | Attending: Emergency Medicine | Admitting: Emergency Medicine

## 2012-04-08 DIAGNOSIS — Z87891 Personal history of nicotine dependence: Secondary | ICD-10-CM | POA: Insufficient documentation

## 2012-04-08 DIAGNOSIS — Z8546 Personal history of malignant neoplasm of prostate: Secondary | ICD-10-CM | POA: Insufficient documentation

## 2012-04-08 DIAGNOSIS — I10 Essential (primary) hypertension: Secondary | ICD-10-CM | POA: Insufficient documentation

## 2012-04-08 DIAGNOSIS — Z7982 Long term (current) use of aspirin: Secondary | ICD-10-CM | POA: Insufficient documentation

## 2012-04-08 DIAGNOSIS — J4489 Other specified chronic obstructive pulmonary disease: Secondary | ICD-10-CM | POA: Insufficient documentation

## 2012-04-08 DIAGNOSIS — J45909 Unspecified asthma, uncomplicated: Secondary | ICD-10-CM | POA: Insufficient documentation

## 2012-04-08 DIAGNOSIS — Z79899 Other long term (current) drug therapy: Secondary | ICD-10-CM | POA: Insufficient documentation

## 2012-04-08 DIAGNOSIS — J449 Chronic obstructive pulmonary disease, unspecified: Secondary | ICD-10-CM | POA: Insufficient documentation

## 2012-04-08 DIAGNOSIS — R059 Cough, unspecified: Secondary | ICD-10-CM | POA: Insufficient documentation

## 2012-04-08 DIAGNOSIS — R05 Cough: Secondary | ICD-10-CM | POA: Insufficient documentation

## 2012-04-08 DIAGNOSIS — J3489 Other specified disorders of nose and nasal sinuses: Secondary | ICD-10-CM | POA: Insufficient documentation

## 2012-04-08 DIAGNOSIS — J029 Acute pharyngitis, unspecified: Secondary | ICD-10-CM | POA: Insufficient documentation

## 2012-04-08 DIAGNOSIS — Z8739 Personal history of other diseases of the musculoskeletal system and connective tissue: Secondary | ICD-10-CM | POA: Insufficient documentation

## 2012-04-08 HISTORY — DX: Malignant neoplasm of prostate: C61

## 2012-04-08 LAB — RAPID STREP SCREEN (MED CTR MEBANE ONLY): Streptococcus, Group A Screen (Direct): NEGATIVE

## 2012-04-08 NOTE — ED Provider Notes (Signed)
History    CSN: [id number] Arrival date & time 04/08/12  2024 First MD Initiated Contact with Patient 04/08/12 2103      Chief Complaint  Patient presents with  . URI    HPI Pt has had a sore throat for weeks.  Associated with this he has had cough and congestion.  He saw a primary doctor and was given antibiotics but the symptoms have persisted.  He feels like his voice is hoarse.  No vomiting or diarrhea.  NO fever.  He brought his wife in today so he figured he would get checked as well. Past Medical History  Diagnosis Date  . Allergy   . Asthma   . Hypertension   . Osteoporosis   . Chronic fatigue syndrome   . Chronic airway obstruction, not elsewhere classified   . Prostate cancer     Past Surgical History  Procedure Date  . Knee surgery   . Anterior cruciate ligament repair   . Prostate surgery   . Hernia repair     No family history on file.  History  Substance Use Topics  . Smoking status: Former Games developer  . Smokeless tobacco: Not on file  . Alcohol Use: No      Review of Systems  All other systems reviewed and are negative.    Allergies  Review of patient's allergies indicates no known allergies.  Home Medications   Current Outpatient Rx  Name  Route  Sig  Dispense  Refill  . AMLODIPINE BESYLATE 5 MG PO TABS   Oral   Take 5 mg by mouth daily.           . ASPIRIN 81 MG PO TABS   Oral   Take 81 mg by mouth daily.           . MULTIVITAMIN PO   Oral   Take by mouth.           Marland Kitchen PANTOPRAZOLE SODIUM 40 MG PO TBEC   Oral   Take 40 mg by mouth daily.           . ONE-A-DAY ENERGY FORMULA PO   Oral   Take by mouth.           Marland Kitchen TADALAFIL 5 MG PO TABS   Oral   Take 5 mg by mouth daily.           . TRIAMTERENE-HCTZ 37.5-25 MG PO TABS   Oral   Take 1 tablet by mouth daily.             BP 149/87  Pulse 69  Temp 98 F (36.7 C) (Oral)  Resp 18  Ht 5\' 8"  (1.727 m)  Wt 215 lb (97.523 kg)  BMI 32.69 kg/m2  SpO2  98%  Physical Exam  Nursing note and vitals reviewed. Constitutional: He appears well-developed and well-nourished. No distress.  HENT:  Head: Normocephalic and atraumatic.  Right Ear: External ear normal.  Left Ear: External ear normal.  Mouth/Throat: No oropharyngeal exudate, posterior oropharyngeal edema, posterior oropharyngeal erythema or tonsillar abscesses.       Nl speech  Eyes: Conjunctivae normal are normal. Right eye exhibits no discharge. Left eye exhibits no discharge. No scleral icterus.  Neck: Neck supple. No rigidity. No tracheal deviation and normal range of motion present.  Cardiovascular: Normal rate and regular rhythm.   Pulmonary/Chest: Effort normal and breath sounds normal. No stridor. No respiratory distress. He has no wheezes. He has no rales.  Musculoskeletal: He exhibits  no edema.  Lymphadenopathy:    He has no cervical adenopathy.  Neurological: He is alert. Cranial nerve deficit: no gross deficits.  Skin: Skin is warm and dry. No rash noted.  Psychiatric: He has a normal mood and affect.    ED Course  Procedures (including critical care time)   Labs Reviewed  RAPID STREP SCREEN   No results found.   1. Pharyngitis       MDM  Patient does not have evidence of a peritonsillar abscess. The symptoms are rather mild. It could be related to a viral illness. I did recommend he followup with his primary care doctor and if he continues to feel persistent sore throat and hoarseness he might consider seeing an ear nose and throat specialist        Celene Kras, MD 04/08/12 2219

## 2012-04-08 NOTE — ED Notes (Signed)
Cough, sinus congestion, sore throat x 3-4 weeks

## 2012-08-18 ENCOUNTER — Other Ambulatory Visit: Payer: Self-pay | Admitting: Internal Medicine

## 2012-08-18 ENCOUNTER — Ambulatory Visit
Admission: RE | Admit: 2012-08-18 | Discharge: 2012-08-18 | Disposition: A | Discharge status: Home / Self Care | Payer: 59 | Source: Ambulatory Visit | Attending: Internal Medicine | Admitting: Internal Medicine

## 2013-01-04 ENCOUNTER — Other Ambulatory Visit: Payer: Self-pay | Admitting: *Deleted

## 2013-01-04 ENCOUNTER — Other Ambulatory Visit: Payer: Self-pay | Admitting: Internal Medicine

## 2013-01-04 ENCOUNTER — Ambulatory Visit
Admission: RE | Admit: 2013-01-04 | Discharge: 2013-01-04 | Disposition: A | Discharge status: Home / Self Care | Payer: Worker's Compensation | Source: Ambulatory Visit | Attending: *Deleted | Admitting: *Deleted

## 2013-01-04 ENCOUNTER — Ambulatory Visit
Admission: RE | Admit: 2013-01-04 | Discharge: 2013-01-04 | Disposition: A | Discharge status: Home / Self Care | Payer: 59 | Source: Ambulatory Visit | Attending: Internal Medicine | Admitting: Internal Medicine

## 2013-01-04 DIAGNOSIS — M542 Cervicalgia: Secondary | ICD-10-CM

## 2013-01-04 DIAGNOSIS — Z8739 Personal history of other diseases of the musculoskeletal system and connective tissue: Secondary | ICD-10-CM

## 2013-01-04 DIAGNOSIS — Z9181 History of falling: Secondary | ICD-10-CM

## 2013-01-04 DIAGNOSIS — M25561 Pain in right knee: Secondary | ICD-10-CM

## 2013-01-10 ENCOUNTER — Other Ambulatory Visit: Payer: Self-pay | Admitting: *Deleted

## 2013-01-10 ENCOUNTER — Other Ambulatory Visit: Payer: Self-pay | Admitting: Emergency Medicine

## 2013-01-10 DIAGNOSIS — M25561 Pain in right knee: Secondary | ICD-10-CM

## 2013-01-13 ENCOUNTER — Ambulatory Visit
Admission: RE | Admit: 2013-01-13 | Discharge: 2013-01-13 | Disposition: A | Discharge status: Home / Self Care | Payer: Worker's Compensation | Source: Ambulatory Visit | Attending: Emergency Medicine | Admitting: Emergency Medicine

## 2013-01-13 DIAGNOSIS — M25561 Pain in right knee: Secondary | ICD-10-CM

## 2013-03-19 ENCOUNTER — Encounter (HOSPITAL_BASED_OUTPATIENT_CLINIC_OR_DEPARTMENT_OTHER): Payer: Self-pay | Admitting: Emergency Medicine

## 2013-03-19 ENCOUNTER — Emergency Department (HOSPITAL_BASED_OUTPATIENT_CLINIC_OR_DEPARTMENT_OTHER)
Admission: EM | Admit: 2013-03-19 | Discharge: 2013-03-20 | Disposition: A | Discharge status: Home / Self Care | Payer: 59 | Attending: Emergency Medicine | Admitting: Emergency Medicine

## 2013-03-19 DIAGNOSIS — Y92410 Unspecified street and highway as the place of occurrence of the external cause: Secondary | ICD-10-CM | POA: Insufficient documentation

## 2013-03-19 DIAGNOSIS — S3981XA Other specified injuries of abdomen, initial encounter: Secondary | ICD-10-CM | POA: Insufficient documentation

## 2013-03-19 DIAGNOSIS — Z8546 Personal history of malignant neoplasm of prostate: Secondary | ICD-10-CM | POA: Insufficient documentation

## 2013-03-19 DIAGNOSIS — T148XXA Other injury of unspecified body region, initial encounter: Secondary | ICD-10-CM

## 2013-03-19 DIAGNOSIS — Y9389 Activity, other specified: Secondary | ICD-10-CM | POA: Insufficient documentation

## 2013-03-19 DIAGNOSIS — Z79899 Other long term (current) drug therapy: Secondary | ICD-10-CM | POA: Insufficient documentation

## 2013-03-19 DIAGNOSIS — Z8739 Personal history of other diseases of the musculoskeletal system and connective tissue: Secondary | ICD-10-CM | POA: Insufficient documentation

## 2013-03-19 DIAGNOSIS — I10 Essential (primary) hypertension: Secondary | ICD-10-CM | POA: Insufficient documentation

## 2013-03-19 DIAGNOSIS — J449 Chronic obstructive pulmonary disease, unspecified: Secondary | ICD-10-CM | POA: Insufficient documentation

## 2013-03-19 DIAGNOSIS — J4489 Other specified chronic obstructive pulmonary disease: Secondary | ICD-10-CM | POA: Insufficient documentation

## 2013-03-19 DIAGNOSIS — S139XXA Sprain of joints and ligaments of unspecified parts of neck, initial encounter: Secondary | ICD-10-CM

## 2013-03-19 DIAGNOSIS — Z87891 Personal history of nicotine dependence: Secondary | ICD-10-CM | POA: Insufficient documentation

## 2013-03-19 DIAGNOSIS — IMO0002 Reserved for concepts with insufficient information to code with codable children: Secondary | ICD-10-CM | POA: Insufficient documentation

## 2013-03-19 DIAGNOSIS — Z7982 Long term (current) use of aspirin: Secondary | ICD-10-CM | POA: Insufficient documentation
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
## 2013-03-19 NOTE — ED Notes (Signed)
c-collar placed at triage.

## 2013-03-20 LAB — URINALYSIS, ROUTINE W REFLEX MICROSCOPIC
Leukocytes, UA: NEGATIVE
Nitrite: NEGATIVE
Specific Gravity, Urine: 1.021 (ref 1.005–1.030)
Urobilinogen, UA: 0.2 mg/dL (ref 0.0–1.0)
pH: 7 (ref 5.0–8.0)

## 2013-03-20 MED ORDER — METHOCARBAMOL 500 MG PO TABS: 500.0000 mg | ORAL_TABLET | Freq: Two times a day (BID) | ORAL | Status: DC

## 2013-03-20 MED ORDER — IBUPROFEN 200 MG PO TABS
600.0000 mg | ORAL_TABLET | Freq: Once | ORAL | Status: AC
  Administered 2013-03-20: 600 mg via ORAL
  Filled 2013-03-20 (×2): qty 1

## 2013-03-20 MED ORDER — IBUPROFEN 600 MG PO TABS: 600.0000 mg | ORAL_TABLET | Freq: Four times a day (QID) | ORAL | Status: DC | PRN

## 2013-03-20 MED ORDER — HYDROCODONE-ACETAMINOPHEN 5-325 MG PO TABS: 1.0000 | ORAL_TABLET | Freq: Four times a day (QID) | ORAL | Status: DC | PRN

## 2013-03-20 NOTE — ED Notes (Signed)
Provided urinal for UA collection.
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
## 2013-04-29 ENCOUNTER — Other Ambulatory Visit: Payer: Self-pay | Admitting: Dermatology

## 2013-06-03 ENCOUNTER — Encounter: Payer: Self-pay | Admitting: Pulmonary Disease

## 2013-06-03 ENCOUNTER — Encounter (INDEPENDENT_AMBULATORY_CARE_PROVIDER_SITE_OTHER): Payer: Self-pay

## 2013-06-03 ENCOUNTER — Ambulatory Visit (INDEPENDENT_AMBULATORY_CARE_PROVIDER_SITE_OTHER): Payer: 59 | Admitting: Pulmonary Disease

## 2013-06-03 VITALS — BP 142/96 | HR 79 | Temp 97.7°F | Ht 67.0 in | Wt 218.0 lb

## 2013-06-03 DIAGNOSIS — R059 Cough, unspecified: Secondary | ICD-10-CM

## 2013-06-03 DIAGNOSIS — R05 Cough: Secondary | ICD-10-CM | POA: Insufficient documentation

## 2013-06-03 DIAGNOSIS — R053 Chronic cough: Secondary | ICD-10-CM | POA: Insufficient documentation

## 2013-06-03 MED ORDER — OMEPRAZOLE 40 MG PO CPDR: 40.0000 mg | DELAYED_RELEASE_CAPSULE | Freq: Every day | ORAL | Status: DC

## 2013-06-03 NOTE — Addendum Note (Signed)
Addended by: Virl Cagey on: 06/03/2013 11:59 AM   Modules accepted: Orders

## 2013-06-03 NOTE — Patient Instructions (Addendum)
Will order a scan of your chest to look for bronchiectasis. Will schedule for breathing studies, and would like to see you back the same day for review. Start omeprazole 40mg  one in am and pm until your next visit with me.

## 2013-06-03 NOTE — Assessment & Plan Note (Signed)
The patient has a long-standing history of chronic cough with mucopurulent material, but has always had a normal chest x-ray with evaluation. He states this has been going on for years, and recently has required multiple rounds of antibiotics and steroids. He has a history of sinusitis, but denies any sinus symptoms at this time. He also has a long history of reflux disease, but states that is better currently. It is unclear whether he may have occult bronchiectasis, or possibly superimposed obstructive lung disease. I would like to start the evaluation with a high-resolution CT chest to rule out bronchiectasis, as well as full PFTs. He may ultimately need an evaluation of his sinuses if all of his studies are unremarkable. Will also need to check quantitative immunoglobulins if he does have bronchiectasis. I have discussed all of this with the patient, and he agrees with our initial approach.

## 2013-06-03 NOTE — Progress Notes (Signed)
   Subjective:    Patient ID: Curtis Bell, male    DOB: [birth year], 63 y.o.   MRN: [id number]  HPI The patient is a 63 year old male who I've been asked to see for recurrent pulmonary infections. The patient states for many years that he has recurrent episodes of coughing with small quantities of mucus, which then leads to chest congestion and purulence as well as shortness of breath. He is treated with antibiotics and improves, but never quite returned to his usual baseline. He is having a lot of difficulties most recently, and has been on 3-4 rounds of antibiotics the last few months. He has had multiple chest x-rays, all of which was clear. The last occurred in January of this year. Studies are not available for my review, but the reports clearly state no acute findings. The patient also has audible wheezing at times with his cough, as well as shortness of breath. The patient does have a history of sinusitis, and has been evaluated by otolaryngology in the past. He also has a history of GERD, but does not feel it is a major issue at this time. He currently has no sinus pressure or congestion, and is unsure if he has postnasal drip. He states that he has a globus sensation in all time. He has no history of exposure to young children on a regular basis, and denies any history of asthma.   Review of Systems  Constitutional: Negative for fever and unexpected weight change.  HENT: Positive for congestion, ear pain, postnasal drip, rhinorrhea and sore throat. Negative for dental problem, nosebleeds, sinus pressure, sneezing and trouble swallowing.   Eyes: Negative for redness and itching.  Respiratory: Positive for cough, shortness of breath and wheezing. Negative for chest tightness.   Cardiovascular: Negative for palpitations and leg swelling.  Gastrointestinal: Negative for nausea and vomiting.  Genitourinary: Negative for dysuria.  Musculoskeletal: Negative for joint swelling.  Skin: Negative for  rash.  Neurological: Positive for headaches.  Hematological: Does not bruise/bleed easily.  Psychiatric/Behavioral: Negative for dysphoric mood. The patient is not nervous/anxious.        Objective:   Physical Exam Constitutional:  Well developed, no acute distress  HENT:  Nares patent without discharge, +erythema and edema left > right  Oropharynx without exudate, palate and uvula are normal  Eyes:  Perrla, eomi, no scleral icterus  Neck:  No JVD, no TMG  Cardiovascular:  Normal rate, regular rhythm, no rubs or gallops.  No murmurs        Intact distal pulses  Pulmonary :  Normal breath sounds, no stridor or respiratory distress   No rales, rhonchi, or wheezing  Abdominal:  Soft, nondistended, bowel sounds present.  No tenderness noted.   Musculoskeletal:  No lower extremity edema noted.  Lymph Nodes:  No cervical lymphadenopathy noted  Skin:  No cyanosis noted  Neurologic:  Alert, appropriate, moves all 4 extremities without obvious deficit.         Assessment & Plan:

## 2013-06-10 ENCOUNTER — Ambulatory Visit (INDEPENDENT_AMBULATORY_CARE_PROVIDER_SITE_OTHER)
Admission: RE | Admit: 2013-06-10 | Discharge: 2013-06-10 | Disposition: A | Discharge status: Home / Self Care | Payer: 59 | Source: Ambulatory Visit | Attending: Pulmonary Disease | Admitting: Pulmonary Disease

## 2013-06-10 DIAGNOSIS — R05 Cough: Secondary | ICD-10-CM

## 2013-06-10 DIAGNOSIS — R059 Cough, unspecified: Secondary | ICD-10-CM

## 2013-06-10 DIAGNOSIS — R053 Chronic cough: Secondary | ICD-10-CM

## 2013-06-13 ENCOUNTER — Encounter: Payer: Self-pay | Admitting: Podiatry

## 2013-06-13 ENCOUNTER — Ambulatory Visit (INDEPENDENT_AMBULATORY_CARE_PROVIDER_SITE_OTHER): Payer: 59 | Admitting: Podiatry

## 2013-06-13 VITALS — BP 161/112 | HR 78 | Resp 18

## 2013-06-13 DIAGNOSIS — L600 Ingrowing nail: Secondary | ICD-10-CM

## 2013-06-13 MED ORDER — HYDROCODONE-ACETAMINOPHEN 5-325 MG PO TABS: 1.0000 | ORAL_TABLET | ORAL | Status: DC | PRN

## 2013-06-13 NOTE — Progress Notes (Signed)
   Subjective:    Patient ID: Curtis Bell, male    DOB: 1950-04-19, 63 y.o.   MRN: [id number]  HPI I have an ingrown toenail on my left big toe that has been going on for about a year and a half and pain if I bump it and sore and tender and there is no draining and hurts with shoes if I rub against it.    Review of Systems  HENT: Positive for sinus pressure.   Eyes: Positive for visual disturbance.  Respiratory: Positive for wheezing.   Musculoskeletal: Positive for back pain.       Joint and muscle pain  Skin:       Change in nails  Allergic/Immunologic: Positive for environmental allergies.  Neurological: Positive for dizziness and headaches.  All other systems reviewed and are negative.       Objective:   Physical Exam Orientated x3  Vascular: DP and PT pulses are 2/4 bilaterally. Capillary fill is immediate bilaterally.  Neurological: Sensation to 10 g monofilament wire intact 5/5 bilaterally  Dermatological: Incurvated margin of the left hallux toenail with palpable tenderness.  Musculoskeletal: There is no restriction of ankle, subtalar, midtarsal joints bilaterally        Assessment & Plan:   Assessment: Ingrowing margin of the left hallux toenail Satisfactory neurovascular status  Plan: Offered patient  permanent removal of marginal the left hallux toenail. She verbally consents. Left hallux is then blocked with 3 cc of 50-50 mixture of 2% plain Xylocaine and 0.5% plain Marcaine. The toe was prepped with Betadine and exsanguinated. The margin of left hallux toenail was excised and a phenol matricectomy performed. An antibiotic compression dressing applied. The tourniquet was released and spontaneous capillary fill times noted in the left hallux.  Postoperative oral written instructions provided Hydrocodone 5/325 dispensed #12 to take 1 by mouth as needed every 4 hours. Reappoint at patient's request

## 2013-06-13 NOTE — Patient Instructions (Signed)

## 2013-06-14 ENCOUNTER — Encounter: Payer: Self-pay | Admitting: Podiatry

## 2013-06-17 ENCOUNTER — Other Ambulatory Visit (HOSPITAL_COMMUNITY): Payer: Self-pay | Admitting: Emergency Medicine

## 2013-06-17 ENCOUNTER — Telehealth: Payer: Self-pay | Admitting: *Deleted

## 2013-06-17 DIAGNOSIS — J441 Chronic obstructive pulmonary disease with (acute) exacerbation: Secondary | ICD-10-CM

## 2013-06-17 NOTE — Telephone Encounter (Addendum)
Per [place], attempted to call patient again to verify appts dates/times are okay. No answer. LMTCB Letter typed up by [facility] and mailed to patient to contact our office in regards to the appts below. I read and approved this letter.  Will send msg to [facility] as FYI that appts scheduled but patient has yet to confirm Will close this message.

## 2013-06-17 NOTE — Telephone Encounter (Signed)
Holly assisted in helping schedule Appts for PFT and OV with [facility] same day. PFT 07/07/13 at [place] w/[facility] 07/07/13 at [place] left detailed message on patient voicemail for dates/times/location and mailed instructions to the patient for the PFT.  Waiting on call back from patient to confirm dates work.

## 2013-07-07 ENCOUNTER — Ambulatory Visit (INDEPENDENT_AMBULATORY_CARE_PROVIDER_SITE_OTHER): Payer: 59 | Admitting: Pulmonary Disease

## 2013-07-07 ENCOUNTER — Ambulatory Visit (HOSPITAL_COMMUNITY)
Admission: RE | Admit: 2013-07-07 | Discharge: 2013-07-07 | Disposition: A | Discharge status: Home / Self Care | Payer: 59 | Source: Ambulatory Visit | Attending: Pulmonary Disease | Admitting: Pulmonary Disease

## 2013-07-07 ENCOUNTER — Encounter: Payer: Self-pay | Admitting: Pulmonary Disease

## 2013-07-07 VITALS — BP 164/90 | HR 85 | Temp 97.0°F | Ht 67.0 in | Wt 221.4 lb

## 2013-07-07 DIAGNOSIS — R0609 Other forms of dyspnea: Secondary | ICD-10-CM | POA: Insufficient documentation

## 2013-07-07 DIAGNOSIS — R059 Cough, unspecified: Secondary | ICD-10-CM

## 2013-07-07 DIAGNOSIS — R053 Chronic cough: Secondary | ICD-10-CM

## 2013-07-07 DIAGNOSIS — R0989 Other specified symptoms and signs involving the circulatory and respiratory systems: Secondary | ICD-10-CM | POA: Insufficient documentation

## 2013-07-07 DIAGNOSIS — R05 Cough: Secondary | ICD-10-CM

## 2013-07-07 DIAGNOSIS — Z87891 Personal history of nicotine dependence: Secondary | ICD-10-CM | POA: Insufficient documentation

## 2013-07-07 LAB — PULMONARY FUNCTION TEST
DL/VA % PRED: 97 %
DL/VA: 4.33 ml/min/mmHg/L
DLCO UNC: 23.85 ml/min/mmHg
DLCO unc % pred: 84 %
FEF 25-75 POST: 2.42 L/s
FEF 25-75 PRE: 2.38 L/s
FEF2575-%Change-Post: 1 %
FEF2575-%PRED-PRE: 96 %
FEF2575-%Pred-Post: 98 %
FEV1-%Change-Post: 0 %
FEV1-%PRED-PRE: 102 %
FEV1-%Pred-Post: 102 %
FEV1-POST: 2.75 L
FEV1-Pre: 2.74 L
FEV1FVC-%CHANGE-POST: 3 %
FEV1FVC-%Pred-Pre: 99 %
FEV6-%CHANGE-POST: -2 %
FEV6-%PRED-POST: 102 %
FEV6-%Pred-Pre: 104 %
FEV6-Post: 3.43 L
FEV6-Pre: 3.5 L
FEV6FVC-%Change-Post: 0 %
FEV6FVC-%PRED-POST: 103 %
FEV6FVC-%Pred-Pre: 103 %
FVC-%CHANGE-POST: -2 %
FVC-%PRED-POST: 98 %
FVC-%Pred-Pre: 100 %
FVC-Post: 3.44 L
FVC-Pre: 3.53 L
POST FEV1/FVC RATIO: 80 %
PRE FEV1/FVC RATIO: 78 %
Post FEV6/FVC ratio: 100 %
Pre FEV6/FVC Ratio: 99 %
RV % pred: 81 %
RV: 1.77 L
TLC % pred: 87 %
TLC: 5.6 L

## 2013-07-07 MED ORDER — ALBUTEROL SULFATE (2.5 MG/3ML) 0.083% IN NEBU
2.5000 mg | INHALATION_SOLUTION | Freq: Once | RESPIRATORY_TRACT | Status: AC
  Administered 2013-07-07: 2.5 mg via RESPIRATORY_TRACT

## 2013-07-07 NOTE — Progress Notes (Signed)
   Subjective:    Patient ID: Curtis Bell, male    DOB: July 14, 1950, 63 y.o.   MRN: [id number]  HPI Patient comes in today for followup of his recent CT chest and PFTs. These were done for evaluation of chronic cough and recurrent pulmonary infections? His CT chest did not show any interstitial disease, no evidence for clinically significant bronchiectasis, but did have some mild airway thickening. His PFTs did not show airflow obstruction, no restriction, and a normal diffusion capacity. I have reviewed his studies with him in detail.   Review of Systems  Constitutional: Negative for fever and unexpected weight change.  HENT: Negative for congestion, dental problem, ear pain, nosebleeds, postnasal drip, rhinorrhea, sinus pressure, sneezing, sore throat and trouble swallowing.   Eyes: Negative for redness and itching.  Respiratory: Positive for cough. Negative for chest tightness, shortness of breath and wheezing.   Cardiovascular: Negative for palpitations and leg swelling.  Gastrointestinal: Negative for nausea and vomiting.  Genitourinary: Negative for dysuria.  Musculoskeletal: Negative for joint swelling.  Skin: Negative for rash.  Neurological: Negative for headaches.  Hematological: Does not bruise/bleed easily.  Psychiatric/Behavioral: Negative for dysphoric mood. The patient is not nervous/anxious.        Objective:   Physical Exam Overweight male in no acute distress Nose without purulence or discharge noted Neck without lymphadenopathy or thyromegaly Chest totally clear to auscultation Lower extremities with mild edema, no cyanosis Alert and oriented, moves all 4 extremities.       Assessment & Plan:

## 2013-07-07 NOTE — Assessment & Plan Note (Signed)
The patient has a fairly normal CT chest with no evidence for bronchiectasis, but he did have some airway thickening. However, his PFTs showed no airflow obstruction, no restriction, and a normal diffusion capacity. I see no explanation for his recurrent cough and "pulmonary infections" from my standpoint. I continued to worry about chronic sinus disease and reflux disease, and the patient currently feels he is getting more congestion in his nose and ears and describes a classic globus sensation. Will do a scan of his sinuses since he has a history of sinus disease, and if abnormal, will get back to his otolaryngologist. If this is unremarkable, I will refer back to his primary physician for a possible GI workup for laryngopharyngeal reflux.

## 2013-07-07 NOTE — Patient Instructions (Signed)
Will check scan of your sinuses to see if this is an ongoing issue for you and causing your symptoms.  Will call you with results, and if abnormal, will need to get you back to your ENT doctor.  I see no pulmonary reason currently to explain your symptoms.

## 2013-07-13 ENCOUNTER — Ambulatory Visit (INDEPENDENT_AMBULATORY_CARE_PROVIDER_SITE_OTHER)
Admission: RE | Admit: 2013-07-13 | Discharge: 2013-07-13 | Disposition: A | Discharge status: Home / Self Care | Payer: 59 | Source: Ambulatory Visit | Attending: Pulmonary Disease | Admitting: Pulmonary Disease

## 2013-07-13 DIAGNOSIS — R059 Cough, unspecified: Secondary | ICD-10-CM

## 2013-07-13 DIAGNOSIS — R053 Chronic cough: Secondary | ICD-10-CM

## 2013-07-13 DIAGNOSIS — R05 Cough: Secondary | ICD-10-CM

## 2013-07-14 ENCOUNTER — Telehealth: Payer: Self-pay | Admitting: Pulmonary Disease

## 2013-07-14 NOTE — Telephone Encounter (Signed)
Result Note     Please let pt know that his sinus scan shows no sinus issues. This is not the source of his cough and mucus. I can find nothing from a pulmonary standpoint, and would like him to get back to his primary to consider workup for reflux induced cough.  --  I spoke with patient about results and he verbalized understanding and had no questions

## 2013-10-13 ENCOUNTER — Other Ambulatory Visit: Payer: Self-pay | Admitting: Gastroenterology

## 2013-10-13 DIAGNOSIS — R1084 Generalized abdominal pain: Secondary | ICD-10-CM

## 2013-10-18 ENCOUNTER — Ambulatory Visit
Admission: RE | Admit: 2013-10-18 | Discharge: 2013-10-18 | Disposition: A | Discharge status: Home / Self Care | Payer: 59 | Source: Ambulatory Visit | Attending: Gastroenterology | Admitting: Gastroenterology

## 2013-10-18 DIAGNOSIS — R1084 Generalized abdominal pain: Secondary | ICD-10-CM

## 2013-10-18 MED ORDER — IOHEXOL 300 MG/ML  SOLN
125.0000 mL | Freq: Once | INTRAMUSCULAR | Status: AC | PRN
  Administered 2013-10-18: 125 mL via INTRAVENOUS

## 2014-02-03 ENCOUNTER — Ambulatory Visit
Admission: RE | Admit: 2014-02-03 | Discharge: 2014-02-03 | Disposition: A | Discharge status: Home / Self Care | Payer: 59 | Source: Ambulatory Visit | Attending: Internal Medicine | Admitting: Internal Medicine

## 2014-02-03 ENCOUNTER — Other Ambulatory Visit: Payer: Self-pay | Admitting: Internal Medicine

## 2014-02-03 DIAGNOSIS — M5441 Lumbago with sciatica, right side: Secondary | ICD-10-CM

## 2014-02-03 DIAGNOSIS — M5442 Lumbago with sciatica, left side: Principal | ICD-10-CM

## 2014-04-25 ENCOUNTER — Other Ambulatory Visit: Payer: Self-pay | Admitting: Osteopathic Medicine

## 2014-04-25 DIAGNOSIS — G529 Cranial nerve disorder, unspecified: Secondary | ICD-10-CM

## 2014-04-25 DIAGNOSIS — H4921 Sixth [abducent] nerve palsy, right eye: Secondary | ICD-10-CM

## 2014-05-01 ENCOUNTER — Other Ambulatory Visit: Payer: Self-pay

## 2014-05-08 ENCOUNTER — Other Ambulatory Visit: Payer: Self-pay | Admitting: Osteopathic Medicine

## 2014-05-08 ENCOUNTER — Ambulatory Visit
Admission: RE | Admit: 2014-05-08 | Discharge: 2014-05-08 | Disposition: A | Discharge status: Home / Self Care | Payer: 59 | Source: Ambulatory Visit | Attending: Specialist | Admitting: Specialist

## 2014-05-08 DIAGNOSIS — H4921 Sixth [abducent] nerve palsy, right eye: Secondary | ICD-10-CM

## 2014-05-08 DIAGNOSIS — Z77018 Contact with and (suspected) exposure to other hazardous metals: Secondary | ICD-10-CM

## 2014-05-08 DIAGNOSIS — G529 Cranial nerve disorder, unspecified: Secondary | ICD-10-CM

## 2014-05-08 MED ORDER — GADOBENATE DIMEGLUMINE 529 MG/ML IV SOLN
20.0000 mL | Freq: Once | INTRAVENOUS | Status: AC | PRN
  Administered 2014-05-08: 20 mL via INTRAVENOUS

## 2014-07-28 ENCOUNTER — Other Ambulatory Visit: Payer: Self-pay | Admitting: Gastroenterology

## 2014-09-25 ENCOUNTER — Other Ambulatory Visit: Payer: Self-pay | Admitting: Internal Medicine

## 2014-09-25 ENCOUNTER — Ambulatory Visit
Admission: RE | Admit: 2014-09-25 | Discharge: 2014-09-25 | Disposition: A | Discharge status: Home / Self Care | Payer: Commercial Managed Care - HMO | Source: Ambulatory Visit | Attending: Internal Medicine | Admitting: Internal Medicine

## 2014-09-25 DIAGNOSIS — R1084 Generalized abdominal pain: Secondary | ICD-10-CM

## 2015-01-16 ENCOUNTER — Other Ambulatory Visit: Payer: Self-pay | Admitting: Internal Medicine

## 2015-01-16 ENCOUNTER — Ambulatory Visit
Admission: RE | Admit: 2015-01-16 | Discharge: 2015-01-16 | Disposition: A | Discharge status: Home / Self Care | Payer: Medicare Other | Source: Ambulatory Visit | Attending: Internal Medicine | Admitting: Internal Medicine

## 2015-01-16 DIAGNOSIS — R531 Weakness: Secondary | ICD-10-CM

## 2015-01-16 DIAGNOSIS — R52 Pain, unspecified: Secondary | ICD-10-CM

## 2015-04-18 ENCOUNTER — Ambulatory Visit (INDEPENDENT_AMBULATORY_CARE_PROVIDER_SITE_OTHER): Payer: Medicare Other | Admitting: Adult Health

## 2015-04-18 ENCOUNTER — Encounter: Payer: Self-pay | Admitting: Adult Health

## 2015-04-18 VITALS — BP 136/82 | HR 67 | Temp 97.9°F | Ht 67.0 in | Wt 223.0 lb

## 2015-04-18 DIAGNOSIS — J209 Acute bronchitis, unspecified: Secondary | ICD-10-CM

## 2015-04-18 DIAGNOSIS — J019 Acute sinusitis, unspecified: Secondary | ICD-10-CM | POA: Insufficient documentation

## 2015-04-18 DIAGNOSIS — R053 Chronic cough: Secondary | ICD-10-CM

## 2015-04-18 DIAGNOSIS — R05 Cough: Secondary | ICD-10-CM

## 2015-04-18 MED ORDER — PREDNISONE 10 MG PO TABS
ORAL_TABLET | ORAL | Status: DC
Start: 2015-04-18 — End: 2015-06-05

## 2015-04-18 MED ORDER — LEVALBUTEROL HCL 0.63 MG/3ML IN NEBU
0.6300 mg | INHALATION_SOLUTION | Freq: Once | RESPIRATORY_TRACT | Status: AC
  Administered 2015-04-18: 0.63 mg via RESPIRATORY_TRACT

## 2015-04-18 NOTE — Assessment & Plan Note (Signed)
Slow to resolve flare post PNA  Previous PFT nml in 2015.  HRCT chest in 2015 bronchitic changes, no ILD  Sinus CT neg for acute dz.  xopenex neb x 1   Plan  Begin prednisone taper over the next week. Mucinex  DM twice daily as needed for cough congestion. Begin Zyrtec 10 mg at bedtime Begin Pepcid 20 mg at bedtime Follow-up Dr. Vaughan Browner in 6-8 weeks and As needed   Please contact office for sooner follow up if symptoms do not improve or worsen or seek emergency care

## 2015-04-18 NOTE — Patient Instructions (Addendum)
Begin prednisone taper over the next week. Mucinex  DM twice daily as needed for cough congestion. Begin Zyrtec 10 mg at bedtime Begin Pepcid 20 mg at bedtime Follow-up Dr. Vaughan Browner in 6-8 weeks and As needed   Please contact office for sooner follow up if symptoms do not improve or worsen or seek emergency care

## 2015-04-18 NOTE — Progress Notes (Signed)
Subjective:    Patient ID: Curtis Bell, male    DOB: 1950/10/04, 65 y.o.   MRN: [id number]  HPI  65 yo male former smoker with chronic cough .  Former patient of Dr. Gwenette Greet   TEST  HRCT 05/2013: no significant bronchiectasis.  Some airway thickening.  PFT's 05/2013: no obstruction, restriction, and normal DLCO   04/18/2015 Follow up :  Pt returns for follow up . Complains of prod cough with yellow mucus, fatigue, wheezing at times, and sinus drainage x 2 months. Has seen PCP several times per pt , dx w/ PNA . Chart review shows CXR on 04/03/15 showed cleared right sided infiltrate w/ clear lungs.  Says he has ongoing cough, congestion with yellow mucus at times and sinus congestion.  Denies any chest tightness/congestion, sinus pressure, fever , nausea or vomiting. Previous workup showed normal PFT in 2015. And HRCT w/ no ILD , some airway thickening.  Not using anything for drainage or cough.     Past Medical History  Diagnosis Date  . Hypertension   . Osteoporosis   . Chronic fatigue syndrome   . Prostate cancer [facility])    Outpatient Encounter Prescriptions as of 04/18/2015  Medication Sig Note  . amLODipine (NORVASC) 10 MG tablet Take 1 tablet by mouth every morning. 04/18/2015: Received from: External Pharmacy Received Sig: TK 1 T PO  D  . aspirin 81 MG tablet Take 81 mg by mouth daily.     . carvedilol (COREG) 3.125 MG tablet Take 10 mg by mouth at bedtime. 04/18/2015: Received from: [place]: Take 10 mg by mouth.  . Cholecalciferol (D3 HIGH POTENCY) 1000 units capsule Take 1,000 Units by mouth daily. 04/18/2015: Received from: [place]: Take 1,000 Units by mouth.  . donepezil (ARICEPT) 10 MG tablet Take 10 mg by mouth at bedtime. 04/18/2015: Received from: [place]: Take 10 mg by mouth.  . fluticasone (FLONASE) 50 MCG/ACT nasal spray Place 2 sprays into the nose daily as needed. 04/18/2015: Received from: [place]: Place 2 sprays into the nose.  . Glucosamine-Chondroit-Vit C-Mn (GLUCOSAMINE CHONDR 1500 COMPLX) CAPS Take by mouth 2 (two) times daily. 04/18/2015: Received from: [place]: Take by mouth.  . Naproxen Sodium (ALEVE) 220 MG CAPS Take 1 capsule by mouth 2 (two) times daily.   . tadalafil (CIALIS) 5 MG tablet Take 5 mg by mouth as needed.    . triamterene-hydrochlorothiazide (DYAZIDE) 37.5-25 MG capsule Take 1 capsule by mouth every morning. 04/18/2015: Received from: [place]: Take 1 capsule by mouth.  Penne Lash HFA 45 MCG/ACT inhaler 2 puffs every 6 (six) hours as needed. Reported on 04/18/2015 06/03/2013: Received Sig:   . [DISCONTINUED] amLODipine (NORVASC) 5 MG tablet Take 5 mg by mouth daily. Reported on 04/18/2015   . [DISCONTINUED] cyclobenzaprine (FLEXERIL) 10 MG tablet Take 10 mg by mouth at bedtime as needed for muscle spasms. Reported on 04/18/2015   . [DISCONTINUED] methocarbamol (ROBAXIN) 500 MG tablet Take 1 tablet (500 mg total) by mouth 2 (two) times daily. (Patient not taking: Reported on 04/18/2015)   . [DISCONTINUED] omeprazole (PRILOSEC) 40 MG capsule Take 1 capsule (40 mg total) by mouth daily. (Patient not taking: Reported on 04/18/2015)   . [DISCONTINUED] triamcinolone cream (KENALOG) 0.1 % Apply topically. Reported on 04/18/2015    No facility-administered encounter medications on file as of 04/18/2015.      Review of Systems Constitutional:  No  weight loss, night sweats,  Fevers, chills, fatigue, or  lassitude.  HEENT:   No headaches,  Difficulty swallowing,  Tooth/dental problems, or  Sore throat,                No sneezing, itching, ear ache,  +nasal congestion, post nasal drip,   CV:  No chest pain,  Orthopnea, PND, swelling in lower extremities, anasarca, dizziness, palpitations, syncope.   GI  No heartburn, indigestion, abdominal pain, nausea, vomiting, diarrhea, change in bowel habits, loss of appetite, bloody stools.   Resp:     No chest wall deformity  Skin: no rash or lesions.  GU: no dysuria, change in color of urine, no urgency or frequency.  No flank pain, no hematuria   MS:  No joint pain or swelling.  No decreased range of motion.  No back pain.  Psych:  No change in mood or affect. No depression or anxiety.  No memory loss.         Objective:   Physical Exam   Filed Vitals:   04/18/15 0905  Pulse: 67  Temp: 97.9 F (36.6 C)  TempSrc: Oral  Height: 5\' 7"  (1.702 m)  Weight: 223 lb (101.152 kg)  SpO2: 94%    GEN: A/Ox3; pleasant , NAD, eldelry , obese   HEENT:  [place]/AT,  EACs-clear, TMs-wnl, NOSE-clear drainage THROAT-clear, no lesions, no postnasal drip or exudate noted.   NECK:  Supple w/ fair ROM; no JVD; normal carotid impulses w/o bruits; no thyromegaly or nodules palpated; no lymphadenopathy.  RESP  Few trace rhonchi , no accessory muscle use, no dullness to percussion  CARD:  RRR, no m/r/g  , no peripheral edema, pulses intact, no cyanosis or clubbing.  GI:   Soft & nt; nml bowel sounds; no organomegaly or masses detected.  Musco: Warm bil, no deformities or joint swelling noted.   Neuro: alert, no focal deficits noted.    Skin: Warm, no lesions or rashes        Assessment & Plan:

## 2015-06-05 ENCOUNTER — Ambulatory Visit (INDEPENDENT_AMBULATORY_CARE_PROVIDER_SITE_OTHER): Payer: Medicare Other | Admitting: Pulmonary Disease

## 2015-06-05 ENCOUNTER — Encounter: Payer: Self-pay | Admitting: Pulmonary Disease

## 2015-06-05 ENCOUNTER — Ambulatory Visit (INDEPENDENT_AMBULATORY_CARE_PROVIDER_SITE_OTHER)
Admission: RE | Admit: 2015-06-05 | Discharge: 2015-06-05 | Disposition: A | Discharge status: Home / Self Care | Payer: Medicare Other | Source: Ambulatory Visit | Attending: Pulmonary Disease | Admitting: Pulmonary Disease

## 2015-06-05 VITALS — BP 136/78 | HR 66 | Ht 67.0 in | Wt 223.0 lb

## 2015-06-05 DIAGNOSIS — R053 Chronic cough: Secondary | ICD-10-CM

## 2015-06-05 DIAGNOSIS — R05 Cough: Secondary | ICD-10-CM

## 2015-06-05 MED ORDER — BUDESONIDE-FORMOTEROL FUMARATE 160-4.5 MCG/ACT IN AERO: 2.0000 | INHALATION_SPRAY | Freq: Two times a day (BID) | RESPIRATORY_TRACT | Status: AC

## 2015-06-05 MED ORDER — FLUTICASONE PROPIONATE 50 MCG/ACT NA SUSP: 2.0000 | Freq: Every day | NASAL | Status: DC

## 2015-06-05 NOTE — Patient Instructions (Addendum)
We will schedule chest x-ray and pulmonary function tests.  Start using the Symbicort twice daily everyday and the flonase 2 sprays daily.   Return to clinic in 2 months.

## 2015-06-05 NOTE — Progress Notes (Signed)
Subjective:    Patient ID: Curtis Bell, male    DOB: [birth year], 65 y.o.   MRN: [id number]  HPI Follow-up for recurrent pneumonia, chronic cough.  Curtis Bell is a 65 year old patient with chronic cough. He is a former patient of Dr. Gwenette Greet. He has h/o recurrent infections and cough. He has had several courses of antibiotics for the past few months. He feels improved currently but still has persistent non productive cough. No fevers or chills. He has sinus discharge and post nasal drip. No symptoms of GERD.   DATA: PFTs 07/07/13 FVC 3.53 [100%) FEV1 2.74 (102%) F/F 78 TLC 87% DLCO 84% Minimal obstructive lung disease  HRCT 06/10/13 COPD. Airway thickening. No significant bronchiectasis, ILD.  Sinus CT 07/13/13  Negative  Social history: 20 pack year smoker. Quit in 22  Family History Father- Prostate cancer  Past Medical History  Diagnosis Date  . Hypertension   . Osteoporosis   . Chronic fatigue syndrome   . Prostate cancer [facility])      Current outpatient prescriptions:  .  amLODipine (NORVASC) 10 MG tablet, Take 1 tablet by mouth every morning., Disp: , Rfl: 2 .  aspirin 81 MG tablet, Take 81 mg by mouth daily.  , Disp: , Rfl:  .  budesonide-formoterol (SYMBICORT) 160-4.5 MCG/ACT inhaler, Inhale 2 puffs into the lungs 2 (two) times daily., Disp: 1 Inhaler, Rfl: 3 .  carvedilol (COREG) 3.125 MG tablet, Take 10 mg by mouth at bedtime., Disp: , Rfl:  .  Cholecalciferol (D3 HIGH POTENCY) 1000 units capsule, Take 1,000 Units by mouth daily., Disp: , Rfl:  .  donepezil (ARICEPT) 10 MG tablet, Take 10 mg by mouth at bedtime., Disp: , Rfl:  .  fluticasone (FLONASE) 50 MCG/ACT nasal spray, Place 2 sprays into the nose daily as needed., Disp: , Rfl:  .  Glucosamine-Chondroit-Vit C-Mn (GLUCOSAMINE CHONDR 1500 COMPLX) CAPS, Take by mouth 2 (two) times daily., Disp: , Rfl:  .  Naproxen Sodium (ALEVE) 220 MG CAPS, Take 1 capsule by mouth 2 (two) times daily., Disp: , Rfl:  .   tadalafil (CIALIS) 5 MG tablet, Take 5 mg by mouth as needed. , Disp: , Rfl:  .  triamterene-hydrochlorothiazide (DYAZIDE) 37.5-25 MG capsule, Take 1 capsule by mouth every morning., Disp: , Rfl:  .  XOPENEX HFA 45 MCG/ACT inhaler, 2 puffs every 6 (six) hours as needed. Reported on 06/05/2015, Disp: , Rfl:   Review of Systems No sputum production. No dyspnea, wheezing. No fevers, chills. No nausea, vomiting, diarrhea, consultation. No chest pain, palpitations. All other ROS are negative.    Objective:   Physical Exam  Blood pressure 136/78, pulse 66, height 5\' 7"  (1.702 m), weight 223 lb (101.152 kg), SpO2 95 %. Gen: No apparent distress Neuro: No gross focal deficits. Neck: No JVD, lymphadenopathy, thyromegaly. RS: Clear, no wheeze, crackles. CVS: S1-S2 heard, no murmurs rubs gallops. Abdomen: Soft, positive bowel sounds. Extremities: No edema.    Assessment & Plan:  Chronic cough, recurrent infection.  Although he's had a workup in the past that does not show any obstruction on PFTs, his CT does show changes consistent with COPD. I suspect this nasal discharge, postnasal drip may be contributing to his cough as well. He is not taking the Symbicort and Flonase on a regular basis. I have encouraged him to use this every day for a month or 2. He'll return to clinic at the end of this period to assess any response to therapy. I'll also  repeat a chest x-ray and pulmonary function tests for a more recent assessment of his lung function. He does not have symptoms of GERD and does not want to try any acid suppressant medications.   Plan: - CXR, PFTs - Use symbicort and flonase on a regular basis.  Return to clinic in 2 months.  Marshell Garfinkel MD [facility] Pager [phone] If no answer or after 3pm call: [phone] 06/05/2015, 3:01 PM

## 2015-06-11 ENCOUNTER — Other Ambulatory Visit: Payer: Self-pay | Admitting: Pulmonary Disease

## 2015-06-11 MED ORDER — FLUTICASONE PROPIONATE 50 MCG/ACT NA SUSP: 2.0000 | Freq: Every day | NASAL | Status: AC

## 2015-06-11 NOTE — Telephone Encounter (Signed)
Received fax from [facility] on [facility] requesting a 90 day supply for pt's Fluticasone 80mcg nas sp, 2 sprays in each nostril daily Pt last seen 3.7.17 Change in rx sent

## 2015-07-23 IMAGING — CR DG KNEE COMPLETE 4+V*R*
4 series · 4 of 4 positions shown · non-contrast
Comparison: MR of the right knee of 05/13/2008 and right knee films
of 04/30/2008

CLINICAL DATA: Fell on 12/18/2012, history of prior ACL repair

EXAM:
RIGHT KNEE - COMPLETE 4+ VIEW

[t knee ap right]
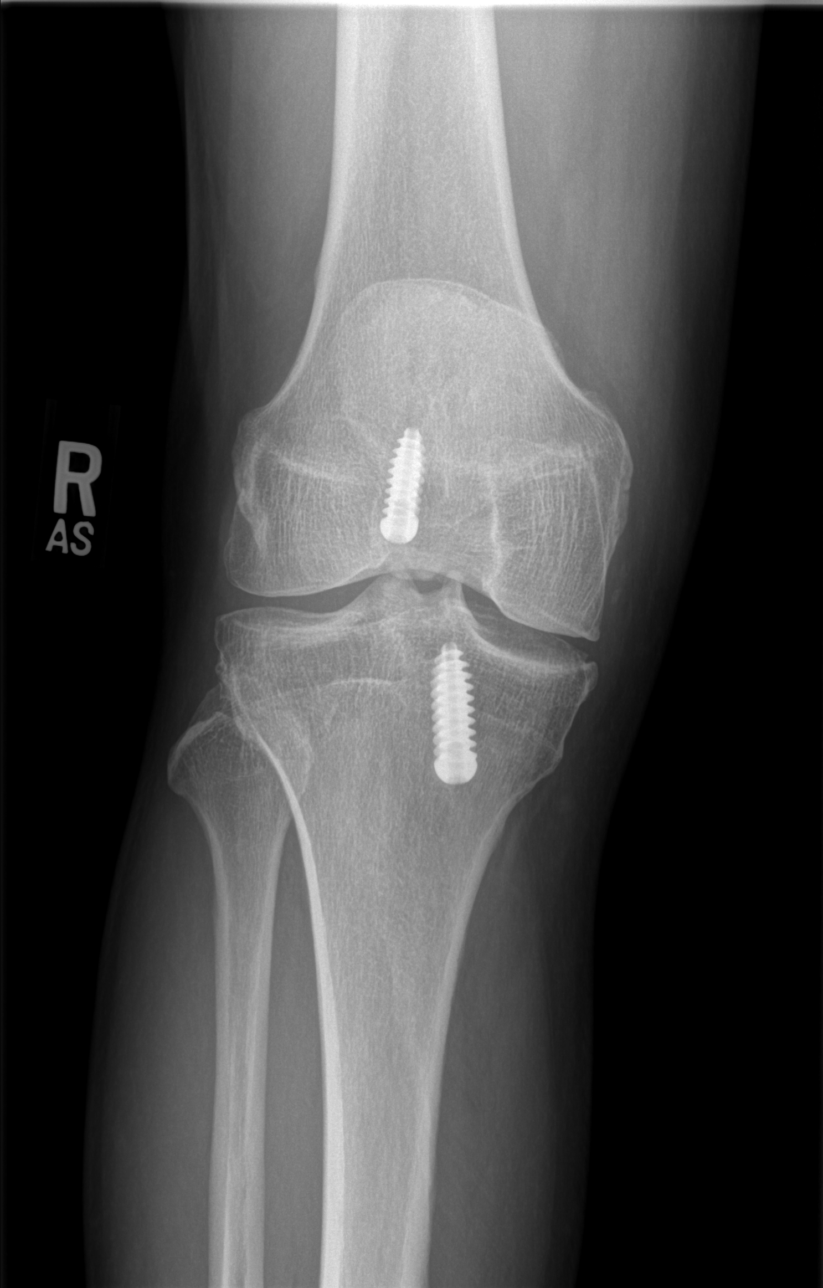

[t knee oblique right (1 of 2)]
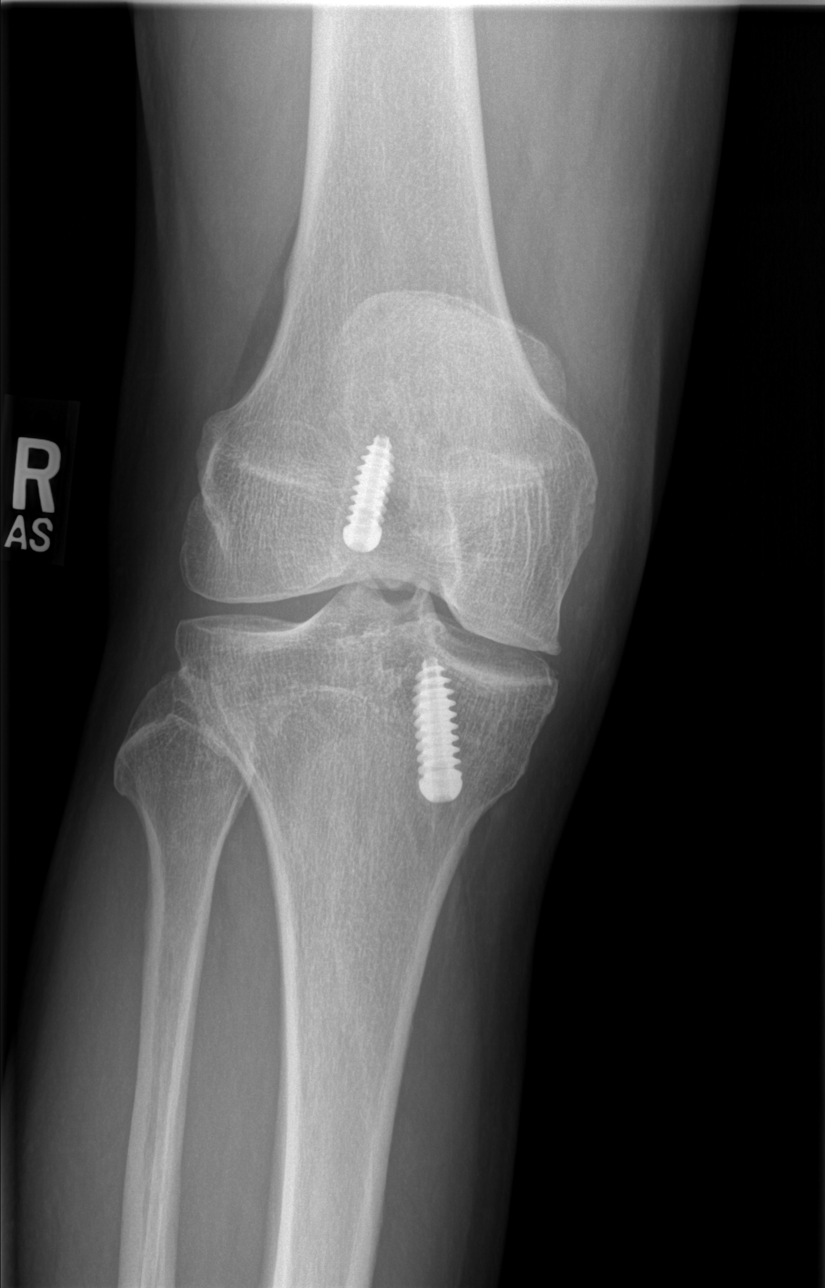

[t knee oblique right (2 of 2)]
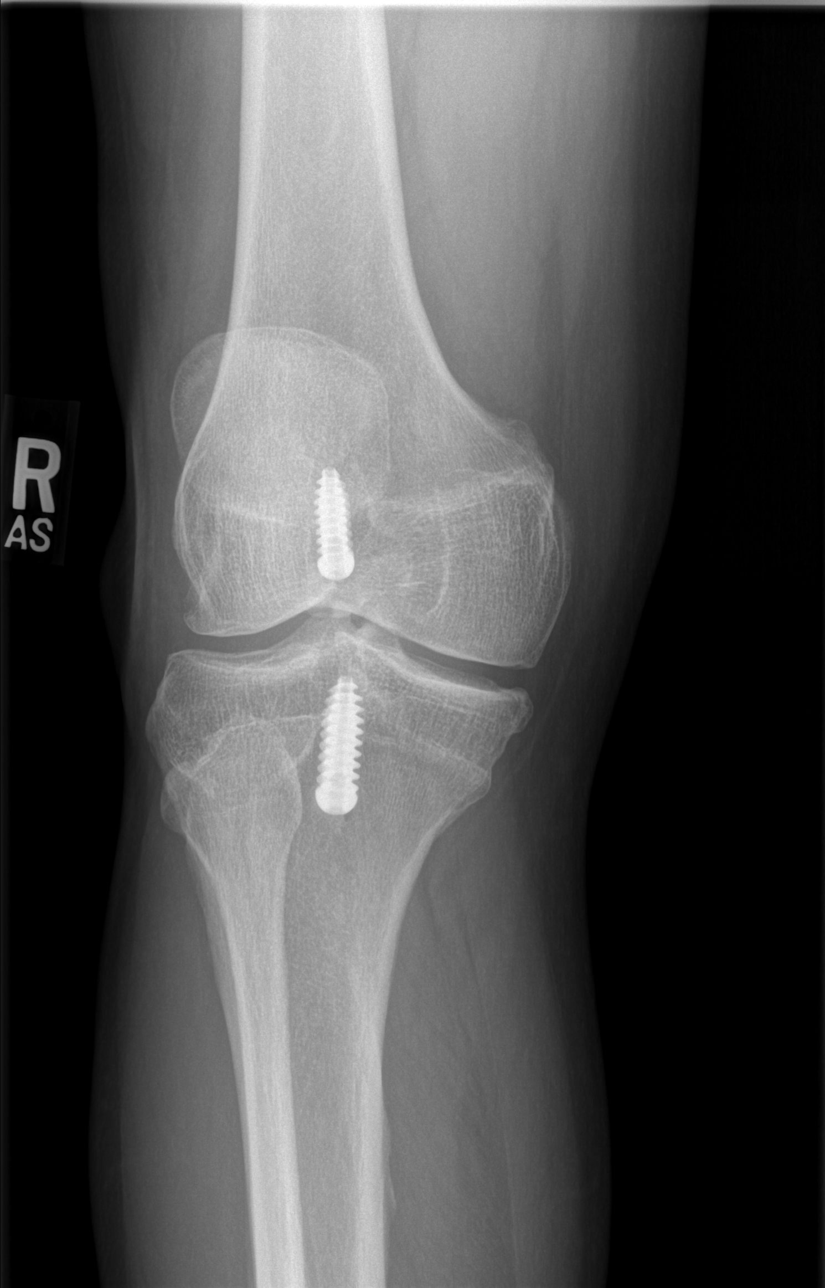

[t knee lat right]
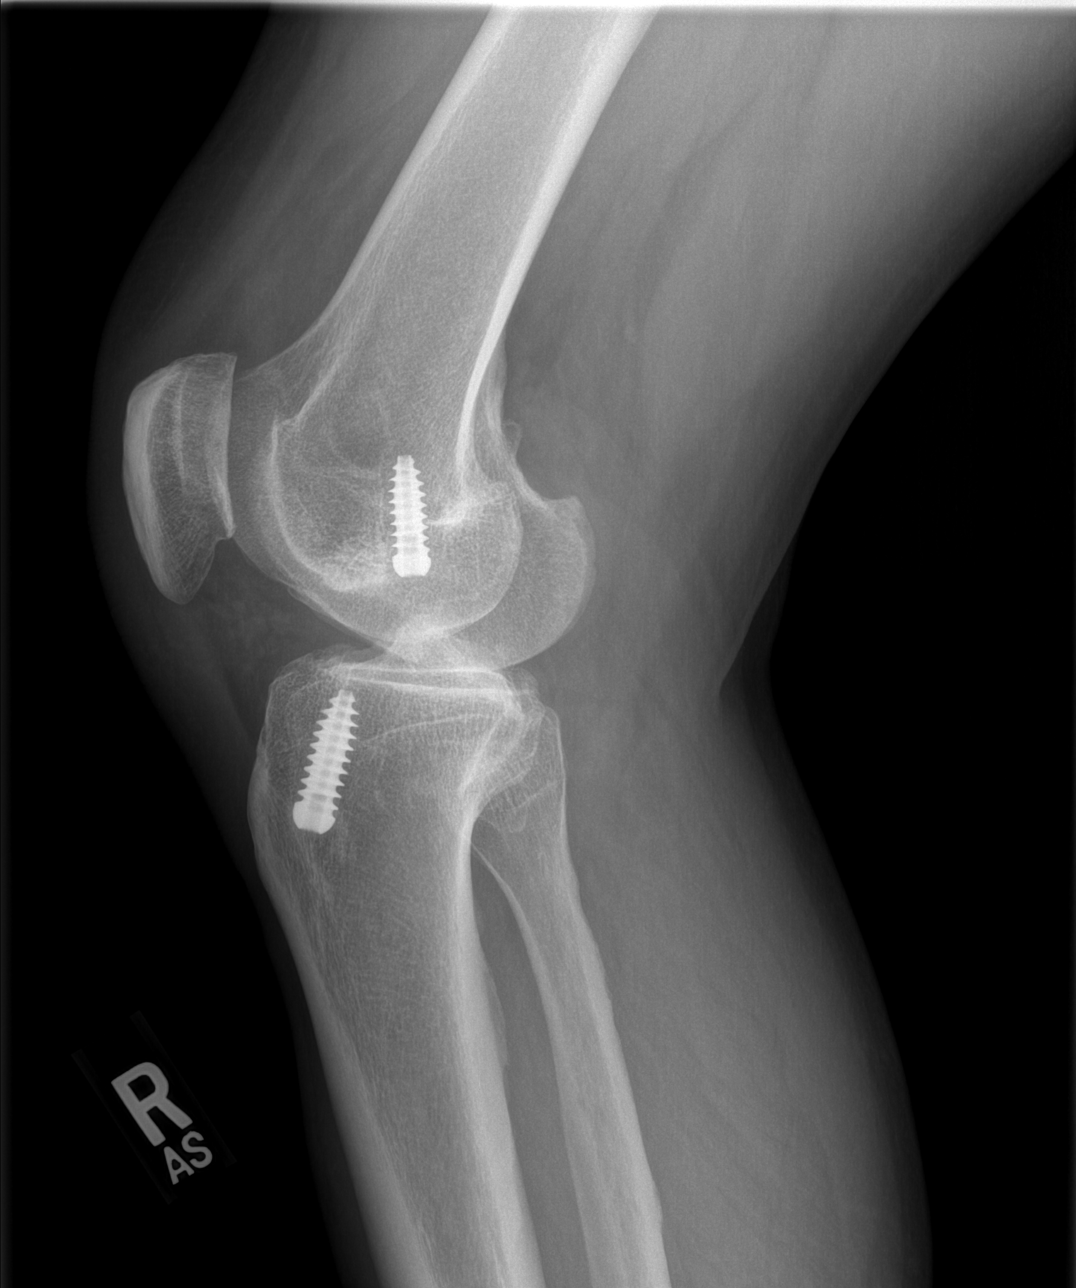

[4 of 4 positions shown; findings below may reference images not displayed]

FINDINGS: Screws within the distal femur and proximal tibia are present for
ACL repair. No acute fracture is seen. No definite joint effusion is
noted. There is mild degenerative joint disease primarily
bicompartmental involving the medial end lateral compartments.
IMPRESSION: Mild bicompartmental degenerative joint disease. No acute
abnormality. Prior ACL repair.

## 2015-07-23 IMAGING — CR DG CERVICAL SPINE COMPLETE 4+V
6 series · 6 of 6 positions shown · non-contrast
Comparison: Cervical spine films of 03/14/2010

CLINICAL DATA: Neck pain for 6 months, history of recent fall

EXAM:
CERVICAL SPINE  4+ VIEWS

[w c-spine lat *]
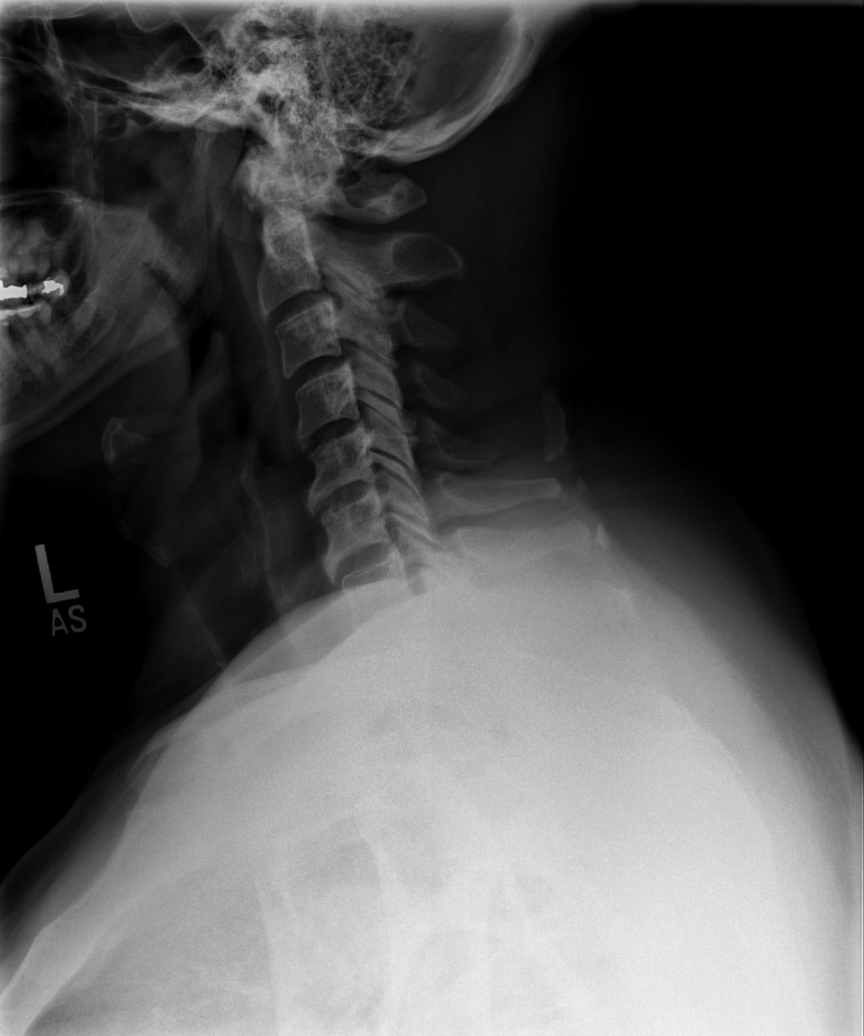

[w swimmers view *]
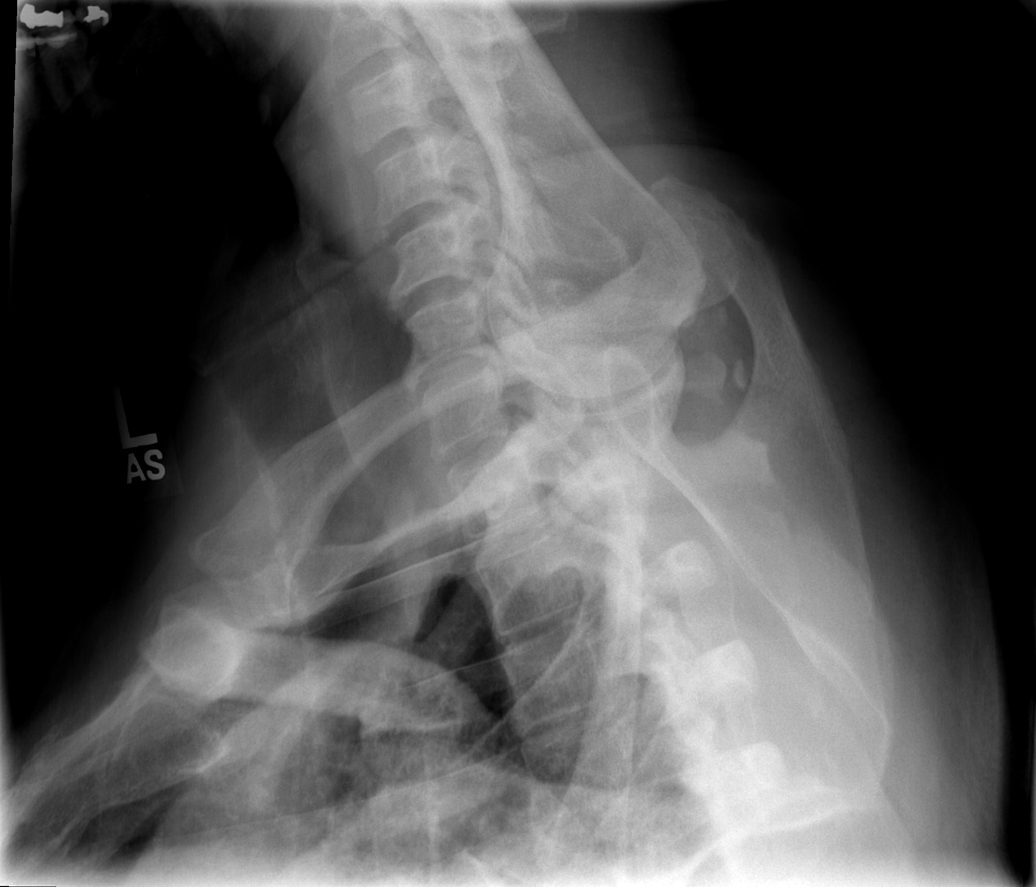

[w c-spine oblique (1 of 2)]
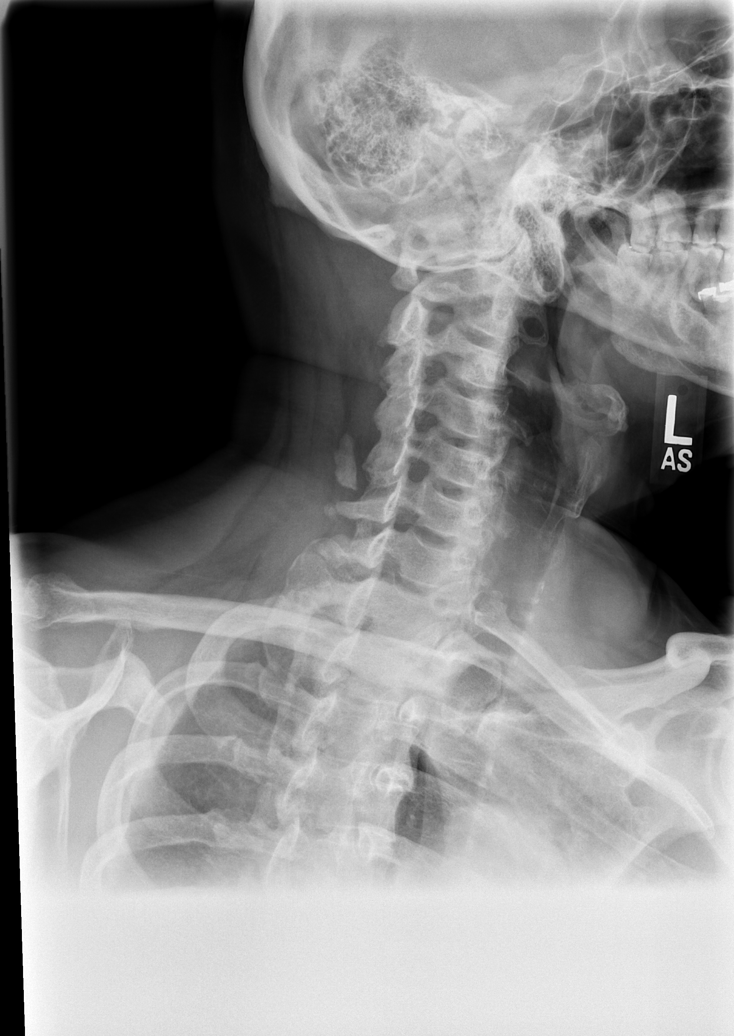

[w c-spine oblique (2 of 2)]
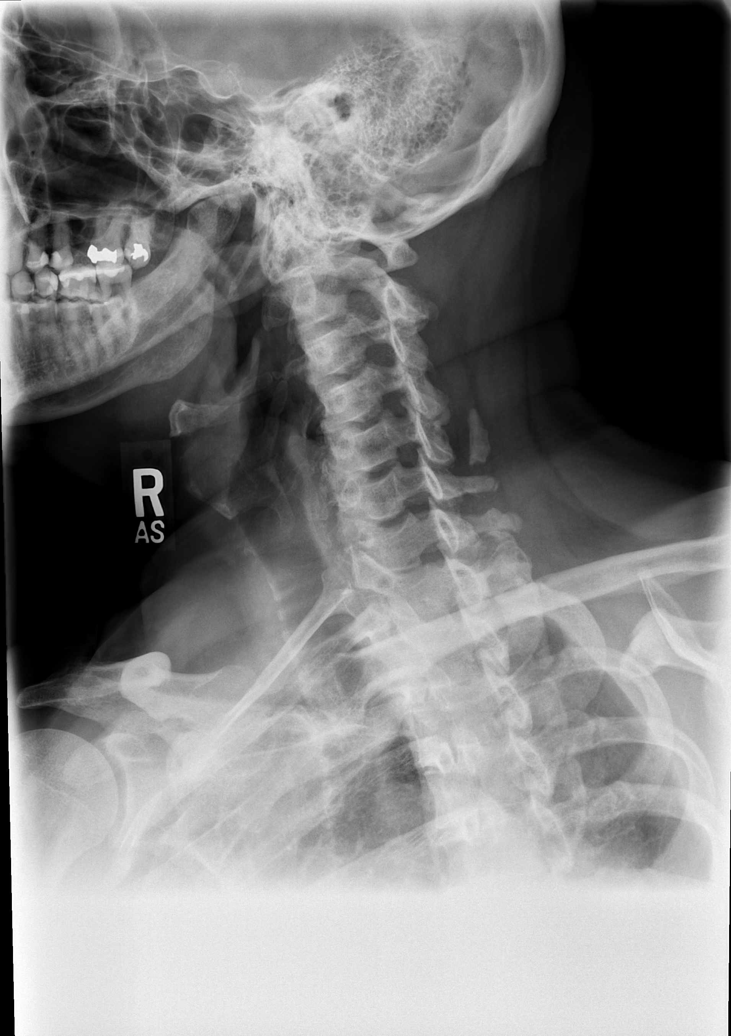

[w c-spine a.p. *]
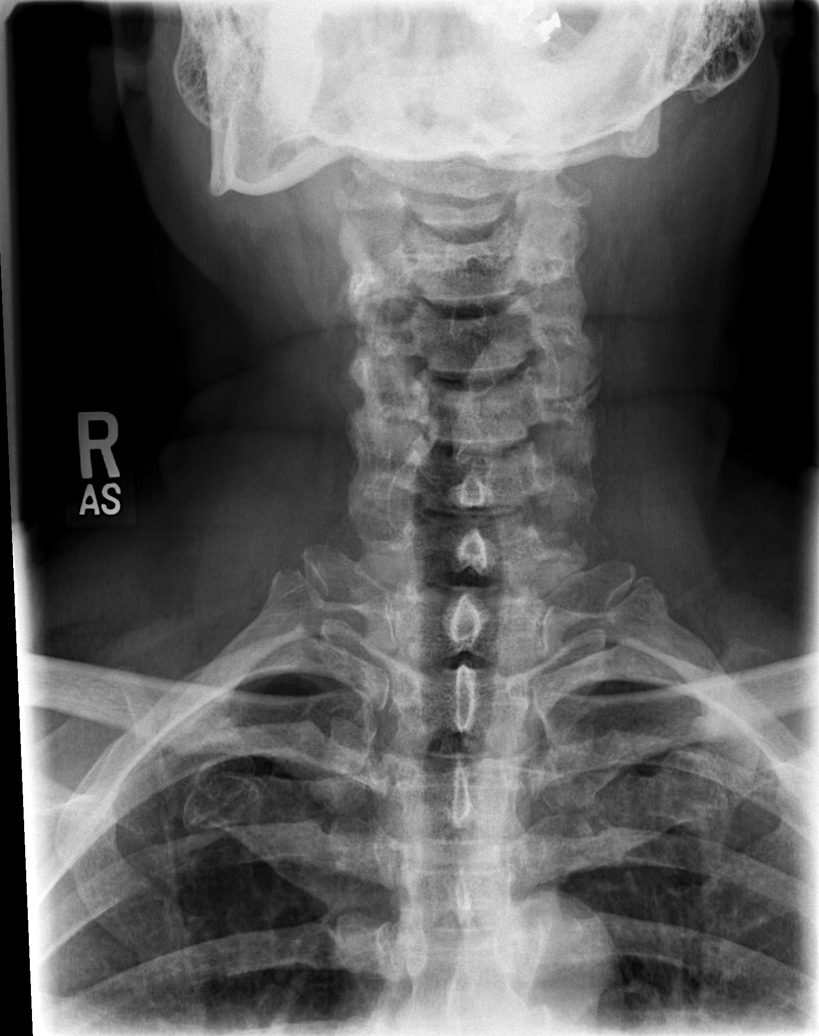

[w c-spine odontoid *]
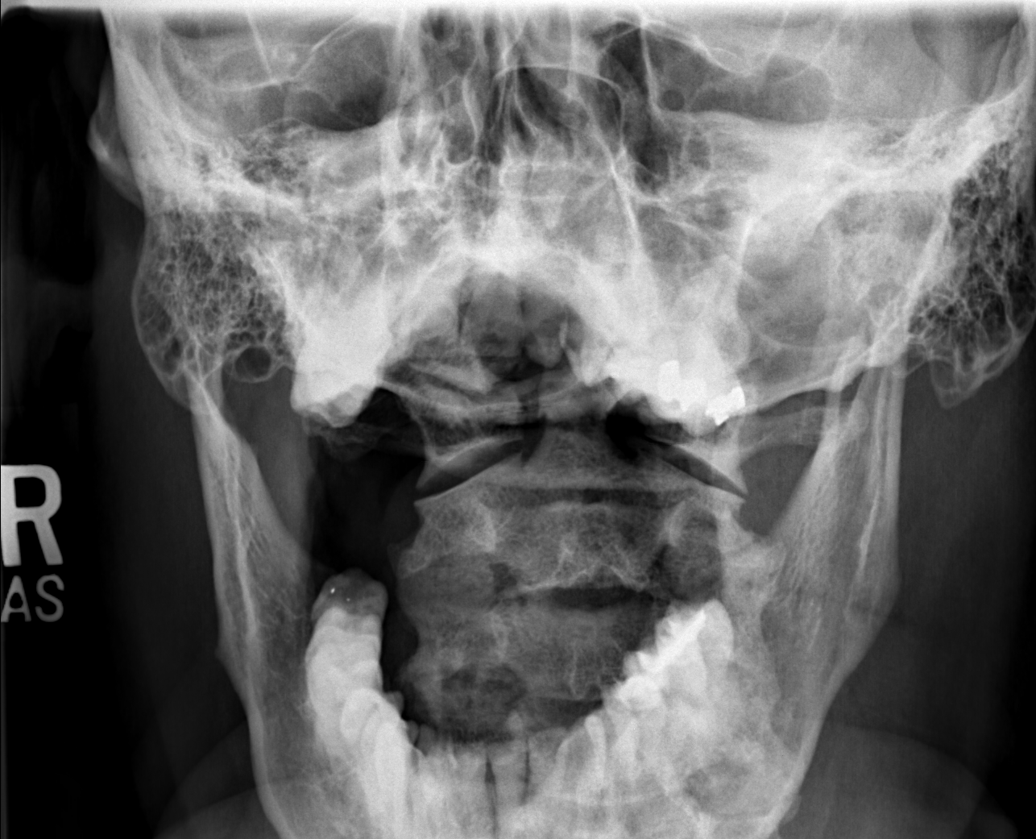

[6 of 6 positions shown; findings below may reference images not displayed]

FINDINGS: The cervical vertebrae remain straightened in alignment. Anterior
osteophyte formation is present primarily at C5-6 and C6-7 which is
relatively stable. No prevertebral soft tissue swelling is seen. No
foraminal narrowing is noted on oblique views. The odontoid process
is intact. Calcification in the soft tissues posteriorly most likely
represents calcification of the ligamentum nuchae.
IMPRESSION: Stable straightened alignment with degenerative change. No
significant foraminal narrowing. No acute abnormality.

## 2015-08-20 ENCOUNTER — Other Ambulatory Visit: Payer: Self-pay | Admitting: Cardiology

## 2015-08-20 ENCOUNTER — Ambulatory Visit
Admission: RE | Admit: 2015-08-20 | Discharge: 2015-08-20 | Disposition: A | Discharge status: Home / Self Care | Payer: Medicare Other | Source: Ambulatory Visit | Attending: Internal Medicine | Admitting: Internal Medicine

## 2015-08-20 ENCOUNTER — Other Ambulatory Visit: Payer: Self-pay | Admitting: Internal Medicine

## 2015-08-20 DIAGNOSIS — R079 Chest pain, unspecified: Secondary | ICD-10-CM

## 2015-08-20 DIAGNOSIS — R52 Pain, unspecified: Secondary | ICD-10-CM

## 2015-08-20 MED ORDER — IOPAMIDOL (ISOVUE-300) INJECTION 61%
125.0000 mL | Freq: Once | INTRAVENOUS | Status: AC | PRN
  Administered 2015-08-20: 125 mL via INTRAVENOUS

## 2015-08-23 ENCOUNTER — Encounter (HOSPITAL_COMMUNITY)
Admission: RE | Admit: 2015-08-23 | Discharge: 2015-08-23 | Disposition: A | Discharge status: Home / Self Care | Payer: Medicare Other | Source: Ambulatory Visit | Attending: Cardiology | Admitting: Cardiology

## 2015-08-23 ENCOUNTER — Encounter (HOSPITAL_COMMUNITY): Admission: RE | Admit: 2015-08-23 | Payer: Medicare Other | Source: Ambulatory Visit

## 2015-08-23 ENCOUNTER — Other Ambulatory Visit: Payer: Self-pay | Admitting: Cardiology

## 2015-08-23 DIAGNOSIS — R079 Chest pain, unspecified: Secondary | ICD-10-CM | POA: Insufficient documentation

## 2015-08-23 LAB — BASIC METABOLIC PANEL
Anion gap: 6 (ref 5–15)
BUN: 8 mg/dL (ref 6–20)
CALCIUM: 9.1 mg/dL (ref 8.9–10.3)
CO2: 27 mmol/L (ref 22–32)
Chloride: 106 mmol/L (ref 101–111)
Creatinine, Ser: 0.96 mg/dL (ref 0.61–1.24)
GFR calc non Af Amer: >60 mL/min (ref >60)
Glucose, Bld: 109 mg/dL — ABNORMAL HIGH (ref 65–99)
Potassium: 3.8 mmol/L (ref 3.5–5.1)
SODIUM: 139 mmol/L (ref 135–145)

## 2015-08-23 LAB — HEPATIC FUNCTION PANEL
ALT: 28 U/L (ref 17–63)
AST: 21 U/L (ref 15–41)
Albumin: 3.6 g/dL (ref 3.5–5.0)
Alkaline Phosphatase: 75 U/L (ref 38–126)
BILIRUBIN TOTAL: 0.7 mg/dL (ref 0.3–1.2)
Bilirubin, Direct: <0.1 mg/dL — ABNORMAL LOW (ref 0.1–0.5)
TOTAL PROTEIN: 6.9 g/dL (ref 6.5–8.1)

## 2015-08-23 MED ORDER — TECHNETIUM TC 99M TETROFOSMIN IV KIT
30.0000 | PACK | Freq: Once | INTRAVENOUS | Status: AC | PRN
  Administered 2015-08-23: 30 via INTRAVENOUS

## 2015-08-23 MED ORDER — REGADENOSON 0.4 MG/5ML IV SOLN
INTRAVENOUS | Status: AC
  Filled 2015-08-23: qty 5

## 2015-08-23 MED ORDER — REGADENOSON 0.4 MG/5ML IV SOLN
0.4000 mg | Freq: Once | INTRAVENOUS | Status: AC
  Administered 2015-08-23: 0.4 mg via INTRAVENOUS

## 2015-08-23 MED ORDER — TECHNETIUM TC 99M TETROFOSMIN IV KIT
10.0000 | PACK | Freq: Once | INTRAVENOUS | Status: AC | PRN
  Administered 2015-08-23: 10 via INTRAVENOUS

## 2015-08-30 ENCOUNTER — Ambulatory Visit (INDEPENDENT_AMBULATORY_CARE_PROVIDER_SITE_OTHER): Payer: Medicare Other | Admitting: Pulmonary Disease

## 2015-08-30 ENCOUNTER — Encounter: Payer: Self-pay | Admitting: Pulmonary Disease

## 2015-08-30 VITALS — BP 122/66 | HR 67 | Ht 67.0 in | Wt 223.0 lb

## 2015-08-30 DIAGNOSIS — R053 Chronic cough: Secondary | ICD-10-CM

## 2015-08-30 DIAGNOSIS — R05 Cough: Secondary | ICD-10-CM | POA: Diagnosis not present

## 2015-08-30 DIAGNOSIS — J209 Acute bronchitis, unspecified: Secondary | ICD-10-CM | POA: Diagnosis not present

## 2015-08-30 LAB — PULMONARY FUNCTION TEST
DL/VA % PRED: 85 %
DL/VA: 3.75 ml/min/mmHg/L
DLCO COR % PRED: 76 %
DLCO UNC % PRED: 83 %
DLCO cor: 21.74 ml/min/mmHg
DLCO unc: 23.52 ml/min/mmHg
FEF 25-75 POST: 1.11 L/s
FEF 25-75 Pre: 1.05 L/sec
FEF2575-%CHANGE-POST: 5 %
FEF2575-%Pred-Post: 46 %
FEF2575-%Pred-Pre: 44 %
FEV1-%CHANGE-POST: -5 %
FEV1-%Pred-Post: 78 %
FEV1-%Pred-Pre: 83 %
FEV1-PRE: 2.19 L
FEV1-Post: 2.07 L
FEV1FVC-%Change-Post: -4 %
FEV1FVC-%Pred-Pre: 92 %
FEV6-%Change-Post: -1 %
FEV6-%Pred-Post: 89 %
FEV6-%Pred-Pre: 91 %
FEV6-PRE: 3.01 L
FEV6-Post: 2.96 L
FEV6FVC-%Change-Post: 0 %
FEV6FVC-%PRED-PRE: 103 %
FEV6FVC-%Pred-Post: 103 %
FVC-%Change-Post: -1 %
FVC-%Pred-Post: 87 %
FVC-%Pred-Pre: 88 %
FVC-Post: 3.02 L
FVC-Pre: 3.07 L
POST FEV1/FVC RATIO: 68 %
PRE FEV6/FVC RATIO: 98 %
Post FEV6/FVC ratio: 98 %
Pre FEV1/FVC ratio: 71 %
RV % PRED: 114 %
RV: 2.53 L
TLC % pred: 94 %
TLC: 6.08 L

## 2015-08-30 MED ORDER — FLUTTER DEVI: Status: AC

## 2015-08-30 NOTE — Addendum Note (Signed)
Addended by: Parke Poisson E on: 08/30/2015 02:15 PM   Modules accepted: Orders

## 2015-08-30 NOTE — Progress Notes (Signed)
PFT done today. 

## 2015-08-30 NOTE — Progress Notes (Addendum)
Patient seen in the office today and instructed on use of Anoro and Flutter Valve.  Patient expressed understanding and demonstrated technique.

## 2015-08-30 NOTE — Patient Instructions (Signed)
We will try you on Anoro elipta. Stop using the Symbicort. We will give a flutter valve clearance of secretions. We will send you for blood tests today to check for immunoglobulin levels, alpha-1 antitrypsin levels. Continue finishing the Omnicef antibiotic as prescribed.  Return to clinic in 3 months.

## 2015-08-30 NOTE — Progress Notes (Signed)
Subjective:    Patient ID: Curtis Bell, male    DOB: 22-Oct-1950, 65 y.o.   MRN: [id number]  HPI Follow-up for recurrent pneumonia, chronic cough.  Curtis Bell is a 65 year old patient with chronic cough. He is a former patient of Dr. Gwenette Greet. He has h/o recurrent infections and cough. He has had several courses of antibiotics for the past few months.  He's had recurrence of his chronic cough with sputum production over the past few weeks and was recently started on Omnicef by his primary care physician. He has sinus discharge and post nasal drip. No symptoms of GERD.   DATA: PFTs 07/07/13 FVC 3.53 [100%) FEV1 2.74 (102%) F/F 78 TLC 87% DLCO 84% Minimal obstructive lung disease.  PFTs 08/30/15 FVC 2.02 (87%) FEV1 2.07 [78%) Sinus/F 68 TLC 94% DLCO 83%. Mild obstructive lung disease  HRCT 06/10/13 COPD. Airway thickening. No significant bronchiectasis, ILD.  Sinus CT 07/13/13  Negative  Chest x-ray 06/05/15 No acute cardiopulmonary process.  Social history: 20 pack year smoker. Quit in 55  Family History Father- Prostate cancer  Past Medical History  Diagnosis Date  . Hypertension   . Osteoporosis   . Chronic fatigue syndrome   . Prostate cancer ([place])      Current outpatient prescriptions:  .  acetaminophen (TYLENOL ARTHRITIS PAIN) 650 MG CR tablet, Per bottle as needed, Disp: , Rfl:  .  amLODipine (NORVASC) 10 MG tablet, Take 1 tablet by mouth every morning., Disp: , Rfl: 2 .  aspirin 81 MG tablet, Take 81 mg by mouth daily.  , Disp: , Rfl:  .  budesonide-formoterol (SYMBICORT) 160-4.5 MCG/ACT inhaler, Inhale 2 puffs into the lungs 2 (two) times daily., Disp: 1 Inhaler, Rfl: 3 .  carvedilol (COREG) 12.5 MG tablet, 1/2 tab by mouth twice daily, Disp: , Rfl: 3 .  cetirizine (ZYRTEC) 10 MG tablet, Take 10 mg by mouth daily., Disp: , Rfl:  .  Cholecalciferol (D3 HIGH POTENCY) 1000 units capsule, Take 1,000 Units by mouth daily., Disp: , Rfl:  .  donepezil (ARICEPT) 10  MG tablet, Take 10 mg by mouth at bedtime., Disp: , Rfl:  .  fluticasone (FLONASE) 50 MCG/ACT nasal spray, Place 2 sprays into both nostrils daily., Disp: 29.7 g, Rfl: 1 .  Glucosamine-Chondroit-Vit C-Mn (GLUCOSAMINE CHONDR 1500 COMPLX) CAPS, Take by mouth 2 (two) times daily., Disp: , Rfl:  .  Multiple Vitamin (MULTIVITAMIN) tablet, Take 1 tablet by mouth daily., Disp: , Rfl:  .  Naproxen Sodium (ALEVE) 220 MG CAPS, Take 1 capsule by mouth 2 (two) times daily., Disp: , Rfl:  .  nitroGLYCERIN (NITROSTAT) 0.4 MG SL tablet, 1 under tongue every 5 mins x3 if needed for chest pain, Disp: , Rfl: 3 .  tadalafil (CIALIS) 5 MG tablet, Take 5 mg by mouth as needed. , Disp: , Rfl:  .  triamterene-hydrochlorothiazide (DYAZIDE) 37.5-25 MG capsule, Take 1 capsule by mouth every morning., Disp: , Rfl:  .  XOPENEX HFA 45 MCG/ACT inhaler, 2 puffs every 6 (six) hours as needed. Reported on 06/05/2015, Disp: , Rfl:   Review of Systems No sputum production. No dyspnea, wheezing. No fevers, chills. No nausea, vomiting, diarrhea, consultation. No chest pain, palpitations. All other ROS are negative.    Objective:   Physical Exam  Blood pressure 122/66, pulse 67, height 5\' 7"  (1.702 m), weight 223 lb (101.152 kg), SpO2 95 %. Gen: No apparent distress Neuro: No gross focal deficits. Neck: No JVD, lymphadenopathy, thyromegaly. RS: Clear, no  wheeze, crackles. CVS: S1-S2 heard, no murmurs rubs gallops. Abdomen: Soft, positive bowel sounds. Extremities: No edema.    Assessment & Plan:  Chronic cough, recurrent infection. This is likely from emphysema, chronic bronchitis. His PFTs done today were reviewed with him. They show slightly worsened obstruction compared to 2015. CT does show changes consistent with COPD. I suspect this nasal discharge, postnasal drip may be contributing to his cough as well. There is no evidence of bronchiectasis or interstitial lung disease on prior imaging of the lung.  He is in the  middle of another bronchitis episode and was started on omnicef. I will switch his Symbicort to LABA/LAMA combination given the small risk of increased pneumonias on inhaled glucocorticoids. I'll also give him a flutter valve for clearance of secretions. He'll be sent down for immunoglobulin testing and alpha-1 antitrypsin levels and phenotype today. He does not have symptoms of GERD and does not want to try any acid suppressant medications.    Plan: - Stop symbicort and start Anoro elipta - Flutter valve - Finish the omnicef abx - check A1AT leves, phenotype and quantitaitve immuboglobulins,  Return to clinic in 27months.  Marshell Garfinkel MD [facility] Pager [phone] If no answer or after 3pm call: [phone] 08/30/2015, 10:32 AM

## 2015-09-10 ENCOUNTER — Telehealth: Payer: Self-pay | Admitting: Pulmonary Disease

## 2015-09-10 MED ORDER — UMECLIDINIUM-VILANTEROL 62.5-25 MCG/INH IN AEPB: 1.0000 | INHALATION_SPRAY | Freq: Every day | RESPIRATORY_TRACT | Status: DC

## 2015-09-10 NOTE — Telephone Encounter (Signed)
Spoke with pt. He states that PM changed his inhaler when he was here last. Symbicort was to be changed to Anoro. Anoro was not sent to his pharmacy. This prescription has been sent in. Nothing further was needed.

## 2015-10-22 ENCOUNTER — Other Ambulatory Visit: Payer: Self-pay | Admitting: Internal Medicine

## 2015-10-22 ENCOUNTER — Ambulatory Visit
Admission: RE | Admit: 2015-10-22 | Discharge: 2015-10-22 | Disposition: A | Discharge status: Home / Self Care | Payer: Medicare Other | Source: Ambulatory Visit | Attending: Internal Medicine | Admitting: Internal Medicine

## 2015-10-22 DIAGNOSIS — R519 Headache, unspecified: Secondary | ICD-10-CM

## 2015-10-22 DIAGNOSIS — R51 Headache: Principal | ICD-10-CM

## 2015-10-22 DIAGNOSIS — J329 Chronic sinusitis, unspecified: Secondary | ICD-10-CM

## 2015-10-22 DIAGNOSIS — H9201 Otalgia, right ear: Secondary | ICD-10-CM

## 2015-11-30 ENCOUNTER — Ambulatory Visit: Payer: Medicare Other | Admitting: Pulmonary Disease

## 2016-05-26 ENCOUNTER — Emergency Department (HOSPITAL_COMMUNITY): Payer: Medicare HMO

## 2016-05-26 ENCOUNTER — Encounter (HOSPITAL_COMMUNITY): Payer: Self-pay

## 2016-05-26 ENCOUNTER — Emergency Department (HOSPITAL_COMMUNITY)
Admission: EM | Admit: 2016-05-26 | Discharge: 2016-05-27 | Disposition: A | Discharge status: Home / Self Care | Payer: Medicare HMO | Attending: Emergency Medicine | Admitting: Emergency Medicine

## 2016-05-26 DIAGNOSIS — Y929 Unspecified place or not applicable: Secondary | ICD-10-CM | POA: Diagnosis not present

## 2016-05-26 DIAGNOSIS — S0081XA Abrasion of other part of head, initial encounter: Secondary | ICD-10-CM | POA: Insufficient documentation

## 2016-05-26 DIAGNOSIS — Y9389 Activity, other specified: Secondary | ICD-10-CM | POA: Diagnosis not present

## 2016-05-26 DIAGNOSIS — Z8546 Personal history of malignant neoplasm of prostate: Secondary | ICD-10-CM | POA: Diagnosis not present

## 2016-05-26 DIAGNOSIS — R930 Abnormal findings on diagnostic imaging of skull and head, not elsewhere classified: Secondary | ICD-10-CM | POA: Insufficient documentation

## 2016-05-26 DIAGNOSIS — Z7982 Long term (current) use of aspirin: Secondary | ICD-10-CM | POA: Diagnosis not present

## 2016-05-26 DIAGNOSIS — S4991XA Unspecified injury of right shoulder and upper arm, initial encounter: Secondary | ICD-10-CM | POA: Diagnosis present

## 2016-05-26 DIAGNOSIS — S80212A Abrasion, left knee, initial encounter: Secondary | ICD-10-CM | POA: Diagnosis not present

## 2016-05-26 DIAGNOSIS — S43014A Anterior dislocation of right humerus, initial encounter: Secondary | ICD-10-CM | POA: Diagnosis not present

## 2016-05-26 DIAGNOSIS — Y999 Unspecified external cause status: Secondary | ICD-10-CM | POA: Diagnosis not present

## 2016-05-26 DIAGNOSIS — I10 Essential (primary) hypertension: Secondary | ICD-10-CM | POA: Insufficient documentation

## 2016-05-26 DIAGNOSIS — S20319A Abrasion of unspecified front wall of thorax, initial encounter: Secondary | ICD-10-CM | POA: Diagnosis not present

## 2016-05-26 DIAGNOSIS — Z87891 Personal history of nicotine dependence: Secondary | ICD-10-CM | POA: Insufficient documentation

## 2016-05-26 DIAGNOSIS — W11XXXA Fall on and from ladder, initial encounter: Secondary | ICD-10-CM | POA: Diagnosis not present

## 2016-05-26 DIAGNOSIS — S43004A Unspecified dislocation of right shoulder joint, initial encounter: Secondary | ICD-10-CM

## 2016-05-26 MED ORDER — HYDROMORPHONE HCL 2 MG/ML IJ SOLN
1.0000 mg | Freq: Once | INTRAMUSCULAR | Status: AC
  Administered 2016-05-26: 1 mg via INTRAVENOUS
  Filled 2016-05-26: qty 1

## 2016-05-26 MED ORDER — HYDROMORPHONE HCL 1 MG/ML PO LIQD: 1.0000 mg | Freq: Once | ORAL | Status: DC

## 2016-05-26 MED ORDER — PROPOFOL 10 MG/ML IV BOLUS
2.0000 mg/kg | Freq: Once | INTRAVENOUS | Status: AC
  Administered 2016-05-26: 195 mg via INTRAVENOUS

## 2016-05-26 MED ORDER — PROPOFOL 10 MG/ML IV BOLUS
INTRAVENOUS | Status: AC
  Administered 2016-05-26: 195 mg via INTRAVENOUS
  Filled 2016-05-26: qty 20

## 2016-05-26 MED ORDER — MORPHINE SULFATE (PF) 4 MG/ML IV SOLN
4.0000 mg | Freq: Once | INTRAVENOUS | Status: AC
  Administered 2016-05-26: 4 mg via INTRAVENOUS
  Filled 2016-05-26: qty 1

## 2016-05-26 NOTE — ED Triage Notes (Signed)
Pt presents via EMS after fall from 4-5 feet off ladder on to R shoulder. Pt denies LOC, head injury, neck/back pain, or weakness. Pt alert, with 10/10 pain to R shoulder. Deformity noted. Gien 250 mcg fentanyl en route. 20 G to L wrist. 202/112, HR 50-60 NSR, RR 16. RUE pulses 2+, sensation and mobility intact.

## 2016-05-26 NOTE — ED Notes (Signed)
Patient transported to CT/xray 

## 2016-05-26 NOTE — ED Provider Notes (Signed)
[employer] DEPT Provider Note   CSN: [id number] Arrival date & time: 05/26/16  2018     History   Chief Complaint Chief Complaint  Patient presents with  . Shoulder Injury  . Fall    HPI Curtis Bell is a 66 y.o. male.  The history is provided by the patient and medical records.  Shoulder Injury   Fall    66 year old male with history of hypertension, osteoporosis, chronic fatigue syndrome, history of prostate cancer status post surgical intervention, presenting to the ED after a fall. Patient reports he was standing on a ladder about 5 feet in the air screwing a plate into the wall.  States the ladder gave way causing him to fall onto his right side.  Reports he did strike his head but denies LOC. States immediate onset of pain to his right shoulder and arm.  Reports he is unable to move his right arm because of this.  He arrives with obvious deformity of the right shoulder.  Patient is left hand dominant. He denies any chest pain or shortness of breath. States he was able to get up and walk after the accident without issue. No numbness or weakness of his legs. He has not had any pain medication prior to arrival. He takes daily aspirin, no other anticoagulation.  Past Medical History:  Diagnosis Date  . Chronic fatigue syndrome   . Hypertension   . Osteoporosis   . Prostate cancer [facility])     Patient Active Problem List   Diagnosis Date Noted  . Bronchitis, acute 04/18/2015  . Chronic cough 06/03/2013    Past Surgical History:  Procedure Laterality Date  . ANTERIOR CRUCIATE LIGAMENT REPAIR    . HERNIA REPAIR    . KNEE SURGERY    . PROSTATE SURGERY         Home Medications    Prior to Admission medications   Medication Sig Start Date End Date Taking? Authorizing Provider  acetaminophen (TYLENOL ARTHRITIS PAIN) 650 MG CR tablet Per bottle as needed    Historical Provider, MD  amLODipine (NORVASC) 10 MG tablet Take 1 tablet by mouth every morning. 03/31/15    Historical Provider, MD  aspirin 81 MG tablet Take 81 mg by mouth daily.      Historical Provider, MD  budesonide-formoterol (SYMBICORT) 160-4.5 MCG/ACT inhaler Inhale 2 puffs into the lungs 2 (two) times daily. 06/05/15   Praveen Mannam, MD  carvedilol (COREG) 12.5 MG tablet 1/2 tab by mouth twice daily 08/13/15   Historical Provider, MD  cetirizine (ZYRTEC) 10 MG tablet Take 10 mg by mouth daily.    Historical Provider, MD  Cholecalciferol (D3 HIGH POTENCY) 1000 units capsule Take 1,000 Units by mouth daily.    Historical Provider, MD  donepezil (ARICEPT) 10 MG tablet Take 10 mg by mouth at bedtime.    Historical Provider, MD  fluticasone (FLONASE) 50 MCG/ACT nasal spray Place 2 sprays into both nostrils daily. 06/11/15   Praveen Mannam, MD  Glucosamine-Chondroit-Vit C-Mn (GLUCOSAMINE CHONDR 1500 COMPLX) CAPS Take by mouth 2 (two) times daily.    Historical Provider, MD  Multiple Vitamin (MULTIVITAMIN) tablet Take 1 tablet by mouth daily.    Historical Provider, MD  Naproxen Sodium (ALEVE) 220 MG CAPS Take 1 capsule by mouth 2 (two) times daily.    Historical Provider, MD  nitroGLYCERIN (NITROSTAT) 0.4 MG SL tablet 1 under tongue every 5 mins x3 if needed for chest pain 08/20/15   Historical Provider, MD  Respiratory Therapy Supplies (  FLUTTER) DEVI Use as directed. 08/30/15   Praveen Mannam, MD  tadalafil (CIALIS) 5 MG tablet Take 5 mg by mouth as needed.     Historical Provider, MD  triamterene-hydrochlorothiazide (DYAZIDE) 37.5-25 MG capsule Take 1 capsule by mouth every morning.    Historical Provider, MD  umeclidinium-vilanterol (ANORO ELLIPTA) 62.5-25 MCG/INH AEPB Inhale 1 puff into the lungs daily. 09/10/15   Praveen Mannam, MD  XOPENEX HFA 45 MCG/ACT inhaler 2 puffs every 6 (six) hours as needed. Reported on 06/05/2015    Historical Provider, MD    Family History Family History  Problem Relation Age of Onset  . Prostate cancer Father   . Lung disease Father     Social History Social History   Substance Use Topics  . Smoking status: Former Smoker    Packs/day: 0.75    Years: 20.00    Types: Cigarettes    Quit date: 03/31/1982  . Smokeless tobacco: Not on file  . Alcohol use No     Allergies   Patient has no known allergies.   Review of Systems Review of Systems  Musculoskeletal: Positive for arthralgias.  All other systems reviewed and are negative.    Physical Exam Updated Vital Signs BP (!) 192/151 (BP Location: Left Arm)   Pulse (!) 59   Temp 97.8 F (36.6 C) (Axillary)   SpO2 98%   Physical Exam  Constitutional: He is oriented to person, place, and time. He appears well-developed and well-nourished.  Screaming during exam  HENT:  Head: Normocephalic and atraumatic.  Mouth/Throat: Oropharynx is clear and moist.  abrasion noted to the top of the head without active bleeding; no skull depression, no hematoma  Eyes: Conjunctivae and EOM are normal. Pupils are equal, round, and reactive to light.  Neck: Normal range of motion.  Cardiovascular: Normal rate, regular rhythm and normal heart sounds.   Pulmonary/Chest: Effort normal and breath sounds normal.  Small abrasion over right posterior ribs, area is nontender, no gross deformity or flail segment, lungs overall clear, patient is able to speak in sentences without difficulty  Abdominal: Soft. Bowel sounds are normal.  Musculoskeletal: Normal range of motion.  Apparent deformity of the right shoulder, pain extending from shoulder all the way through humerus, patient screams when attempted to palpate Pelvis appears stable, patient was able to walk from the EMS stretcher to the bed without issue, no leg shortening Small abrasion to medial left knee; no swelling or  deformities, no pain with ROM  Neurological: He is alert and oriented to person, place, and time.  Skin: Skin is warm and dry.  Psychiatric: He has a normal mood and affect.  Nursing note and vitals reviewed.    ED Treatments / Results   Labs (all labs ordered are listed, but only abnormal results are displayed) Labs Reviewed - No data to display  EKG  EKG Interpretation None       Radiology Dg Ribs Unilateral W/chest Right  Result Date: 05/26/2016 CLINICAL DATA:  Status post fall.  Right rib pain. EXAM: RIGHT RIBS AND CHEST - 3+ VIEW COMPARISON:  None. FINDINGS: No fracture or other bone lesions are seen involving the ribs. There is no evidence of pneumothorax or pleural effusion. Both lungs are clear. Heart size and mediastinal contours are within normal limits. IMPRESSION: Negative. Electronically Signed   By: Kathreen Devoid   On: 05/26/2016 21:51   Dg Pelvis 1-2 Views  Result Date: 05/26/2016 CLINICAL DATA:  Right-sided pain after fall off ladder. EXAM:  PELVIS - 1-2 VIEW COMPARISON:  None. FINDINGS: The cortical margins of the bony pelvis are intact. No fracture. Pubic symphysis and sacroiliac joints are congruent. Both femoral heads are well-seated in the respective acetabula. Mild bilateral hip osteoarthritis. Multiple surgical clips in the pelvis on prior lymph node dissection and prostatectomy. IMPRESSION: No evidence of pelvic fracture. Electronically Signed   By: Jeb Levering M.D.   On: 05/26/2016 21:45   Dg Shoulder Right  Result Date: 05/26/2016 CLINICAL DATA:  Fall from ladder. EXAM: RIGHT SHOULDER - 2+ VIEW COMPARISON:  None. FINDINGS: There is anterior glenohumeral dislocation. The acromioclavicular joint remains approximated. Contour irregularity along the posterior margin of the humeral head likely indicates impaction fracture. IMPRESSION: Anterior dislocation at the right glenohumeral joint. Contour irregularity along the posterior aspect of the humeral head is likely a Hill-Sachs fracture. Electronically Signed   By: Ulyses Jarred M.D.   On: 05/26/2016 21:45   Dg Elbow 2 Views Right  Result Date: 05/27/2016 CLINICAL DATA:  Right arm pain after fall.  Shoulder dislocation. EXAM: RIGHT ELBOW - 2 VIEW  COMPARISON:  Humerus radiographs 3 hours prior. FINDINGS: Single portable lateral view of the right elbow demonstrates no evidence of acute fracture. There is no elbow joint effusion. Suspect posterior soft tissue edema. IMPRESSION: No evidence of elbow fracture on single lateral view. No joint effusion. Electronically Signed   By: Jeb Levering M.D.   On: 05/27/2016 00:19   Ct Head Wo Contrast  Result Date: 05/26/2016 CLINICAL DATA:  [place] from ladder. No loss of consciousness. History of prostate cancer. EXAM: CT HEAD WITHOUT CONTRAST CT CERVICAL SPINE WITHOUT CONTRAST TECHNIQUE: Multidetector CT imaging of the head and cervical spine was performed following the standard protocol without intravenous contrast. Multiplanar CT image reconstructions of the cervical spine were also generated. COMPARISON:  Cervical spine radiograph October 22, 2015 and CT head October 22, 2015 FINDINGS: CT HEAD FINDINGS BRAIN: The ventricles and sulci are normal for age. No intraparenchymal hemorrhage, mass effect nor midline shift. Patchy supratentorial white matter hypodensities less than expected for patient's age, though non-specific are most compatible with chronic small vessel ischemic disease. No acute large vascular territory infarcts. No abnormal extra-axial fluid collections. Basal cisterns are patent. VASCULAR: Mildly dense intracranial vessels most compatible with hemoconcentration. SKULL: No skull fracture. No significant scalp soft tissue swelling. SINUSES/ORBITS: Partially imaged small LEFT maxillary mucosal retention cysts, mild ethmoid and sphenoid sinus mucosal thickening without paranasal sinus air-fluid levels. The included ocular globes and orbital contents are non-suspicious. OTHER: Small LEFT frontal sebaceous cyst. CT CERVICAL SPINE FINDINGS ALIGNMENT: Straightened lordosis. Vertebral bodies in alignment. SKULL BASE AND VERTEBRAE: Cervical vertebral bodies and posterior elements are intact. Intervertebral disc  heights preserved, multilevel ventral endplate spurring. No destructive bony lesions. C1-2 articulation maintained. Moderate to severe upper cervical facet arthropathy. SOFT TISSUES AND SPINAL CANAL: Nonacute. Nuchal ligament calcifications. DISC LEVELS: No significant osseous canal stenosis. Moderate RIGHT C3-4, LEFT C4-5 neural foraminal narrowing. UPPER CHEST: Biapical bullous changes. OTHER: RIGHT shoulder dislocation. Please see dedicated RIGHT shoulder radiograph from same day, reported separately. IMPRESSION: CT HEAD: Negative CT HEAD for age. CT CERVICAL SPINE: No acute fracture or malalignment. Moderate C3-4 and C4-5 neural foraminal narrowing. Electronically Signed   By: Elon Alas M.D.   On: 05/26/2016 22:11   Ct Cervical Spine Wo Contrast  Result Date: 05/26/2016 CLINICAL DATA:  [place] from ladder. No loss of consciousness. History of prostate cancer. EXAM: CT HEAD WITHOUT CONTRAST CT CERVICAL SPINE WITHOUT CONTRAST  TECHNIQUE: Multidetector CT imaging of the head and cervical spine was performed following the standard protocol without intravenous contrast. Multiplanar CT image reconstructions of the cervical spine were also generated. COMPARISON:  Cervical spine radiograph October 22, 2015 and CT head October 22, 2015 FINDINGS: CT HEAD FINDINGS BRAIN: The ventricles and sulci are normal for age. No intraparenchymal hemorrhage, mass effect nor midline shift. Patchy supratentorial white matter hypodensities less than expected for patient's age, though non-specific are most compatible with chronic small vessel ischemic disease. No acute large vascular territory infarcts. No abnormal extra-axial fluid collections. Basal cisterns are patent. VASCULAR: Mildly dense intracranial vessels most compatible with hemoconcentration. SKULL: No skull fracture. No significant scalp soft tissue swelling. SINUSES/ORBITS: Partially imaged small LEFT maxillary mucosal retention cysts, mild ethmoid and sphenoid sinus  mucosal thickening without paranasal sinus air-fluid levels. The included ocular globes and orbital contents are non-suspicious. OTHER: Small LEFT frontal sebaceous cyst. CT CERVICAL SPINE FINDINGS ALIGNMENT: Straightened lordosis. Vertebral bodies in alignment. SKULL BASE AND VERTEBRAE: Cervical vertebral bodies and posterior elements are intact. Intervertebral disc heights preserved, multilevel ventral endplate spurring. No destructive bony lesions. C1-2 articulation maintained. Moderate to severe upper cervical facet arthropathy. SOFT TISSUES AND SPINAL CANAL: Nonacute. Nuchal ligament calcifications. DISC LEVELS: No significant osseous canal stenosis. Moderate RIGHT C3-4, LEFT C4-5 neural foraminal narrowing. UPPER CHEST: Biapical bullous changes. OTHER: RIGHT shoulder dislocation. Please see dedicated RIGHT shoulder radiograph from same day, reported separately. IMPRESSION: CT HEAD: Negative CT HEAD for age. CT CERVICAL SPINE: No acute fracture or malalignment. Moderate C3-4 and C4-5 neural foraminal narrowing. Electronically Signed   By: Elon Alas M.D.   On: 05/26/2016 22:11   Dg Shoulder Right Portable  Result Date: 05/27/2016 CLINICAL DATA:  Post reduction. EXAM: PORTABLE RIGHT SHOULDER COMPARISON:  RIGHT shoulder radiograph May 26 2016 FINDINGS: The humeral head is well-formed and located. The subacromial, glenohumeral and acromioclavicular joint spaces are intact. No destructive bony lesions. Soft tissue planes are non-suspicious. IMPRESSION: Successful closed reduction RIGHT shoulder dislocation. Electronically Signed   By: Elon Alas M.D.   On: 05/27/2016 00:26   Dg Humerus Right  Result Date: 05/26/2016 CLINICAL DATA:  Fall from ladder EXAM: RIGHT HUMERUS - 2+ VIEW COMPARISON:  None. FINDINGS: Anterior dislocation of the right humeral head. No fracture of the humeral shaft. IMPRESSION: No humeral shaft fracture. Electronically Signed   By: Ulyses Jarred M.D.   On: 05/26/2016  21:46    Procedures Reduction of dislocation Date/Time: 05/26/2016 11:58 PM Performed by: Larene Pickett Authorized by: Larene Pickett  Consent: Verbal consent obtained. Risks and benefits: risks, benefits and alternatives were discussed Consent given by: patient Patient understanding: patient states understanding of the procedure being performed Patient consent: the patient's understanding of the procedure matches consent given Procedure consent: procedure consent matches procedure scheduled Relevant documents: relevant documents present and verified Imaging studies: imaging studies available Required items: required blood products, implants, devices, and special equipment available Patient identity confirmed: verbally with patient and arm band Time out: Immediately prior to procedure a "time out" was called to verify the correct patient, procedure, equipment, support staff and site/side marked as required. Preparation: Patient was prepped and draped in the usual sterile fashion. Local anesthesia used: no  Anesthesia: Local anesthesia used: no  Sedation: Patient sedated: yes Sedation type: moderate (conscious) sedation Sedatives: propofol Analgesia: see MAR for details Sedation start date/time: 05/26/2016 11:44 AM Sedation end date/time: 05/26/2016 11:50 AM Vitals: Vital signs were monitored during sedation. Patient tolerance: Patient  tolerated the procedure well with no immediate complications Comments: Right shoulder dislocation was reduced using traction and countertraction.  Afterwards, shoulder appeared located again, able to range fully in all directions.  Patient tolerated well.      (including critical care time)  Medications Ordered in ED Medications  oxyCODONE-acetaminophen (PERCOCET/ROXICET) 5-325 MG per tablet 1 tablet (not administered)  morphine 4 MG/ML injection 4 mg (4 mg Intravenous Given 05/26/16 2036)  HYDROmorphone (DILAUDID) injection 1 mg (1 mg  Intravenous Given 05/26/16 2056)  HYDROmorphone (DILAUDID) injection 1 mg (1 mg Intravenous Given 05/26/16 2228)  propofol (DIPRIVAN) 10 mg/mL bolus/IV push 195 mg (195 mg Intravenous Given 05/26/16 2329)     Initial Impression / Assessment and Plan / ED Course  I have reviewed the triage vital signs and the nursing notes.  Pertinent labs & imaging results that were available during my care of the patient were reviewed by me and considered in my medical decision making (see chart for details).  66 year old male here with fall from ladder. He was reportedly 5 feet May or. Does report head injury but no loss of consciousness. Was ambulatory after the event. Here he is screaming in pain secondary to shoulder. He has an obvious deformity and will not allow me to palpate the area. He does have a small abrasion to his right posterior ribs and small abrasion to the top of his head. He has no open wounds or bleeding at this time. Given his age and nature of the injuries, will obtain CT of his head and neck as well as films of the right ribs, right shoulder, right humerus, and pelvis.  Imaging studies normal aside from right shoulder dislocation.  Risks vs benefits of procedural sedation explained to patient and his family, they were willing to proceed with closed reduction.  Procedural sedation performed by attending physician, Dr. Sherry Ruffing.  Reduction was performed using traction and countertraction. Patient tolerated well, vitals were monitored during procedure and remained stable. Clinically relocated, able to range shoulder without difficulty. Sling was applied. Repeat films to follow.  Repeat film shows successful reduction of the right shoulder without any noted complications. Patient began complaining of some right elbow pain after this, x-ray was obtained as well which is normal. Patient to remain in his sling for now. He was given orthopedic follow-up. Percocet given for pain. Advised not to drive or  operate machinery while taking this medication. Wife will call in the morning to make his appointment.  Discussed plan with patient and wife, they acknowledged understanding and agreed with plan of care.  Return precautions given for new or worsening symptoms.  Final Clinical Impressions(s) / ED Diagnoses   Final diagnoses:  Fall from ladder, initial encounter  Dislocation of right shoulder joint, initial encounter    New Prescriptions New Prescriptions   OXYCODONE-ACETAMINOPHEN (PERCOCET/ROXICET) 5-325 MG TABLET    Take 1 tablet by mouth every 4 (four) hours as needed.     Larene Pickett, PA-C 05/27/16 0050    Gwenyth Allegra Tegeler, MD 05/27/16 1130

## 2016-05-27 ENCOUNTER — Emergency Department (HOSPITAL_COMMUNITY): Payer: Medicare HMO

## 2016-05-27 MED ORDER — OXYCODONE-ACETAMINOPHEN 5-325 MG PO TABS
1.0000 | ORAL_TABLET | Freq: Once | ORAL | Status: AC
  Administered 2016-05-27: 1 via ORAL
  Filled 2016-05-27: qty 1

## 2016-05-27 MED ORDER — PROPOFOL 10 MG/ML IV BOLUS
INTRAVENOUS | Status: DC | PRN
  Administered 2016-05-26: 20 mg via INTRAVENOUS
  Administered 2016-05-26: 10 mg via INTRAVENOUS
  Administered 2016-05-26 (×3): 20 mg via INTRAVENOUS

## 2016-05-27 MED ORDER — OXYCODONE-ACETAMINOPHEN 5-325 MG PO TABS: 1.0000 | ORAL_TABLET | ORAL | 0 refills | Status: DC | PRN

## 2016-05-27 NOTE — Sedation Documentation (Signed)
First sedation medication administered

## 2016-05-27 NOTE — Discharge Instructions (Signed)
Take the prescribed medication as directed.  Do not drive or operate heavy machinery while taking this pain medication. Follow-up with  orthopedics-- call in the morning to make appt for follow-up. Return to the ED for new or worsening symptoms.

## 2016-05-27 NOTE — Sedation Documentation (Signed)
PA felt shoulder reset into correct place. Reduction to be verified by portable X-Ray.

## 2016-05-29 ENCOUNTER — Ambulatory Visit (INDEPENDENT_AMBULATORY_CARE_PROVIDER_SITE_OTHER): Payer: Medicare HMO | Admitting: Orthopaedic Surgery

## 2016-05-29 ENCOUNTER — Encounter (INDEPENDENT_AMBULATORY_CARE_PROVIDER_SITE_OTHER): Payer: Self-pay | Admitting: Orthopaedic Surgery

## 2016-05-29 DIAGNOSIS — S43014A Anterior dislocation of right humerus, initial encounter: Secondary | ICD-10-CM

## 2016-05-29 NOTE — Progress Notes (Signed)
Office Visit Note   Patient: Curtis Bell           Date of Birth: [birth year]           MRN: [id number] Visit Date: 05/29/2016              Requested by: Rogers Blocker, MD [address] [place], [place] [address] PCP: Rogers Blocker, MD   Assessment & Plan: Visit Diagnoses:  1. Closed anterior dislocation of right shoulder, initial encounter     Plan: I will like to treat him in a sling for 3 weeks to allow the pain to improve. I'll see him back in 3 weeks for another reexamination of his shoulder. May consider MRI of the right shoulder if it is consistent with rotator cuff tear  Follow-Up Instructions: Return in about 3 weeks (around 06/19/2016).   Orders:  No orders of the defined types were placed in this encounter.  No orders of the defined types were placed in this encounter.     Procedures: No procedures performed   Clinical Data: No additional findings.   Subjective: Chief Complaint  Patient presents with  . Right Shoulder - Pain    Patient is a 66 year old gentleman who sustained a anterior right shoulder dislocation 3 days ago from falling off a ladder. He was worked up in the ER and he had no other injuries. The ER did reduce the shoulder. He has some soreness. He is left-hand dominant and retired. He is taking oxycodone for pain. Denies any numbness or tingling.    Review of Systems  Constitutional: Negative.   All other systems reviewed and are negative.    Objective: Vital Signs: There were no vitals taken for this visit.  Physical Exam  Constitutional: He is oriented to person, place, and time. He appears well-developed and well-nourished.  HENT:  Head: Normocephalic and atraumatic.  Eyes: Pupils are equal, round, and reactive to light.  Neck: Neck supple.  Pulmonary/Chest: Effort normal.  Abdominal: Soft.  Musculoskeletal: Normal range of motion.  Neurological: He is alert and oriented to person, place, and time.  Skin: Skin is  warm.  Psychiatric: He has a normal mood and affect. His behavior is normal. Judgment and thought content normal.  Nursing note and vitals reviewed.   Ortho Exam Exam of the right shoulder shows intact axillary nerve function. His hand function is intact. Rotator cuff testing is difficult given the amount of discomfort. Specialty Comments:  No specialty comments available.  Imaging: No results found.   PMFS History: Patient Active Problem List   Diagnosis Date Noted  . Closed anterior dislocation of right shoulder 05/29/2016  . Bronchitis, acute 04/18/2015  . Chronic cough 06/03/2013   Past Medical History:  Diagnosis Date  . Chronic fatigue syndrome   . Hypertension   . Osteoporosis   . Prostate cancer [facility])     Family History  Problem Relation Age of Onset  . Prostate cancer Father   . Lung disease Father     Past Surgical History:  Procedure Laterality Date  . ANTERIOR CRUCIATE LIGAMENT REPAIR    . HERNIA REPAIR    . KNEE SURGERY    . PROSTATE SURGERY     Social History   Occupational History  . Not on file.   Social History Main Topics  . Smoking status: Former Smoker    Packs/day: 0.75    Years: 20.00    Types: Cigarettes    Quit date:  03/31/1982  . Smokeless tobacco: Never Used  . Alcohol use No  . Drug use: No  . Sexual activity: Not on file

## 2016-05-30 MED ORDER — OXYCODONE-ACETAMINOPHEN 5-325 MG PO TABS: 1.0000 | ORAL_TABLET | Freq: Four times a day (QID) | ORAL | 0 refills | Status: DC | PRN

## 2016-05-30 NOTE — Addendum Note (Signed)
Addended by: Azucena Cecil on: 05/30/2016 10:52 AM   Modules accepted: Orders

## 2016-06-10 ENCOUNTER — Ambulatory Visit (INDEPENDENT_AMBULATORY_CARE_PROVIDER_SITE_OTHER): Payer: Medicare HMO | Admitting: Orthopaedic Surgery

## 2016-06-10 ENCOUNTER — Encounter (INDEPENDENT_AMBULATORY_CARE_PROVIDER_SITE_OTHER): Payer: Self-pay | Admitting: Orthopaedic Surgery

## 2016-06-10 DIAGNOSIS — S43014A Anterior dislocation of right humerus, initial encounter: Secondary | ICD-10-CM | POA: Diagnosis not present

## 2016-06-10 NOTE — Progress Notes (Signed)
   Office Visit Note   Patient: Curtis Bell           Date of Birth: 03-Feb-1951           MRN: [id number] Visit Date: 06/10/2016              Requested by: Rogers Blocker, MD [address] [place], [place] [address] PCP: Rogers Blocker, MD   Assessment & Plan: Visit Diagnoses:  1. Closed anterior dislocation of right shoulder, initial encounter     Plan: MRI right shoulder to evaluate for rotator cuff tear. Follow-up after MRI. May begin to wean sling as tolerated.  Follow-Up Instructions: Return in about 10 days (around 06/20/2016).   Orders:  Orders Placed This Encounter  Procedures  . MR SHOULDER RIGHT WO CONTRAST   No orders of the defined types were placed in this encounter.     Procedures: No procedures performed   Clinical Data: No additional findings.   Subjective: Chief Complaint  Patient presents with  . Right Shoulder - Follow-up    Patient comes back today for his 3 week follow-up status post right shoulder dislocation. His pain is feeling much better.    Review of Systems   Objective: Vital Signs: There were no vitals taken for this visit.  Physical Exam  Ortho Exam Exam of the right shoulder shows normally functioning infraspinatus but very weak supraspinatus. He has positive drop arm test. Specialty Comments:  No specialty comments available.  Imaging: No results found.   PMFS History: Patient Active Problem List   Diagnosis Date Noted  . Closed anterior dislocation of right shoulder 05/29/2016  . Bronchitis, acute 04/18/2015  . Chronic cough 06/03/2013   Past Medical History:  Diagnosis Date  . Chronic fatigue syndrome   . Hypertension   . Osteoporosis   . Prostate cancer [facility])     Family History  Problem Relation Age of Onset  . Prostate cancer Father   . Lung disease Father     Past Surgical History:  Procedure Laterality Date  . ANTERIOR CRUCIATE LIGAMENT REPAIR    . HERNIA REPAIR    . KNEE SURGERY    .  PROSTATE SURGERY     Social History   Occupational History  . Not on file.   Social History Main Topics  . Smoking status: Former Smoker    Packs/day: 0.75    Years: 20.00    Types: Cigarettes    Quit date: 03/31/1982  . Smokeless tobacco: Never Used  . Alcohol use No  . Drug use: No  . Sexual activity: Not on file

## 2016-06-11 ENCOUNTER — Ambulatory Visit
Admission: RE | Admit: 2016-06-11 | Discharge: 2016-06-11 | Disposition: A | Discharge status: Home / Self Care | Payer: Medicare HMO | Source: Ambulatory Visit | Attending: Orthopaedic Surgery | Admitting: Orthopaedic Surgery

## 2016-06-11 ENCOUNTER — Other Ambulatory Visit (INDEPENDENT_AMBULATORY_CARE_PROVIDER_SITE_OTHER): Payer: Self-pay | Admitting: Orthopaedic Surgery

## 2016-06-11 ENCOUNTER — Telehealth (INDEPENDENT_AMBULATORY_CARE_PROVIDER_SITE_OTHER): Payer: Self-pay | Admitting: *Deleted

## 2016-06-11 DIAGNOSIS — Z77018 Contact with and (suspected) exposure to other hazardous metals: Secondary | ICD-10-CM

## 2016-06-11 MED ORDER — HYDROCODONE-ACETAMINOPHEN 5-325 MG PO TABS: 1.0000 | ORAL_TABLET | Freq: Four times a day (QID) | ORAL | 0 refills | Status: DC | PRN

## 2016-06-11 NOTE — Telephone Encounter (Signed)
Norco #30

## 2016-06-11 NOTE — Telephone Encounter (Signed)
Rx printed, pending Signature.

## 2016-06-11 NOTE — Telephone Encounter (Signed)
Will be ready for pick up Thursday AM 

## 2016-06-11 NOTE — Telephone Encounter (Signed)
Pt came in stating he needs pain medication, he was in yesterday and go his sling off and now he is in pain. CB:[phone]

## 2016-06-11 NOTE — Telephone Encounter (Signed)
Please advise. Thank you

## 2016-06-16 ENCOUNTER — Other Ambulatory Visit: Payer: Medicare Other

## 2016-06-17 ENCOUNTER — Telehealth (INDEPENDENT_AMBULATORY_CARE_PROVIDER_SITE_OTHER): Payer: Self-pay | Admitting: Orthopaedic Surgery

## 2016-06-17 NOTE — Telephone Encounter (Signed)
Please advise 

## 2016-06-17 NOTE — Telephone Encounter (Signed)
Didn't I just give him a refill recently?

## 2016-06-17 NOTE — Telephone Encounter (Signed)
That's too soon.  He needs to supplement with advil

## 2016-06-17 NOTE — Telephone Encounter (Signed)
RX REFILL HYDROCODONE

## 2016-06-17 NOTE — Telephone Encounter (Signed)
Yes. 06/11/16- # 30 hydrocodone

## 2016-06-18 ENCOUNTER — Ambulatory Visit
Admission: RE | Admit: 2016-06-18 | Discharge: 2016-06-18 | Disposition: A | Discharge status: Home / Self Care | Payer: Medicare HMO | Source: Ambulatory Visit | Attending: Orthopaedic Surgery | Admitting: Orthopaedic Surgery

## 2016-06-18 ENCOUNTER — Telehealth (INDEPENDENT_AMBULATORY_CARE_PROVIDER_SITE_OTHER): Payer: Self-pay | Admitting: Orthopaedic Surgery

## 2016-06-18 DIAGNOSIS — S43014A Anterior dislocation of right humerus, initial encounter: Secondary | ICD-10-CM

## 2016-06-18 NOTE — Telephone Encounter (Signed)
Disregard the part of the message about needing more information from Dr. Erlinda Hong about the MRI.  He had it done this morning.  Thank you.

## 2016-06-18 NOTE — Telephone Encounter (Signed)
Called pt- LMOM 

## 2016-06-18 NOTE — Telephone Encounter (Signed)
Patient called back this afternoon stating that he takes 4 pills per day and he had a quantity of 30 pills.  He then stated that since he takes 4 pills a day, his prescription will run out soon than a month.  He has been taking advil with it, but advil alone does not help the pain. He will be out of his pain medicine tomorrow morning.  He also stated that he has not had his MRI because the imaging center told him that he needed more information from Dr. Erlinda Hong.   Thank you.

## 2016-06-19 ENCOUNTER — Ambulatory Visit (INDEPENDENT_AMBULATORY_CARE_PROVIDER_SITE_OTHER): Payer: Medicare HMO | Admitting: Orthopaedic Surgery

## 2016-06-20 ENCOUNTER — Ambulatory Visit (INDEPENDENT_AMBULATORY_CARE_PROVIDER_SITE_OTHER): Payer: Medicare HMO | Admitting: Orthopaedic Surgery

## 2016-06-20 DIAGNOSIS — S43014A Anterior dislocation of right humerus, initial encounter: Secondary | ICD-10-CM | POA: Diagnosis not present

## 2016-06-20 MED ORDER — HYDROCODONE-ACETAMINOPHEN 5-325 MG PO TABS: 1.0000 | ORAL_TABLET | Freq: Four times a day (QID) | ORAL | 0 refills | Status: DC | PRN

## 2016-06-20 NOTE — Progress Notes (Signed)
   Office Visit Note   Patient: Curtis Bell           Date of Birth: 1951/03/22           MRN: [id number] Visit Date: 06/20/2016              Requested by: Rogers Blocker, MD [address] [place], [place] [address] PCP: Rogers Blocker, MD   Assessment & Plan: Visit Diagnoses:  1. Closed anterior dislocation of right shoulder, initial encounter     Plan: MRI of his right shoulder shows expected pathology from shoulder dislocation. He hasn't tendinosis of the supraspinatus with a small partial thickness tear. We will try to treat this nonoperatively with time and physical therapy. I'll like to reevaluate in 6 weeks. Hydrocodone was refilled.  Follow-Up Instructions: Return in about 6 weeks (around 08/01/2016).   Orders:  No orders of the defined types were placed in this encounter.  Meds ordered this encounter  Medications  . HYDROcodone-acetaminophen (NORCO) 5-325 MG tablet    Sig: Take 1 tablet by mouth every 6 (six) hours as needed.    Dispense:  30 tablet    Refill:  0      Procedures: No procedures performed   Clinical Data: No additional findings.   Subjective: No chief complaint on file.   Patient follows up today to review his MRI. He complains of some mild pain in his upper arm.    Review of Systems   Objective: Vital Signs: There were no vitals taken for this visit.  Physical Exam  Ortho Exam Right shoulder exam is neurovascularly intact. Axillary nerve is intact. His strength is improving. Specialty Comments:  No specialty comments available.  Imaging: No results found.   PMFS History: Patient Active Problem List   Diagnosis Date Noted  . Closed anterior dislocation of right shoulder 05/29/2016  . Bronchitis, acute 04/18/2015  . Chronic cough 06/03/2013   Past Medical History:  Diagnosis Date  . Chronic fatigue syndrome   . Hypertension   . Osteoporosis   . Prostate cancer [facility])     Family History  Problem Relation Age of  Onset  . Prostate cancer Father   . Lung disease Father     Past Surgical History:  Procedure Laterality Date  . ANTERIOR CRUCIATE LIGAMENT REPAIR    . HERNIA REPAIR    . KNEE SURGERY    . PROSTATE SURGERY     Social History   Occupational History  . Not on file.   Social History Main Topics  . Smoking status: Former Smoker    Packs/day: 0.75    Years: 20.00    Types: Cigarettes    Quit date: 03/31/1982  . Smokeless tobacco: Never Used  . Alcohol use No  . Drug use: No  . Sexual activity: Not on file

## 2016-07-31 ENCOUNTER — Ambulatory Visit (INDEPENDENT_AMBULATORY_CARE_PROVIDER_SITE_OTHER): Payer: Medicare HMO | Admitting: Orthopaedic Surgery

## 2016-07-31 ENCOUNTER — Encounter (INDEPENDENT_AMBULATORY_CARE_PROVIDER_SITE_OTHER): Payer: Self-pay | Admitting: Orthopaedic Surgery

## 2016-07-31 DIAGNOSIS — M75101 Unspecified rotator cuff tear or rupture of right shoulder, not specified as traumatic: Secondary | ICD-10-CM

## 2016-07-31 DIAGNOSIS — S43014A Anterior dislocation of right humerus, initial encounter: Secondary | ICD-10-CM | POA: Diagnosis not present

## 2016-07-31 MED ORDER — HYDROCODONE-ACETAMINOPHEN 5-325 MG PO TABS: 1.0000 | ORAL_TABLET | Freq: Every day | ORAL | 0 refills | Status: DC | PRN

## 2016-07-31 NOTE — Progress Notes (Signed)
Office Visit Note   Patient: Curtis Bell           Date of Birth: 1950/12/16           MRN: [id number] Visit Date: 07/31/2016              Requested by: Rogers Blocker, MD [address] [place], [place] [address] PCP: Rogers Blocker, MD   Assessment & Plan: Visit Diagnoses:  1. Closed anterior dislocation of right shoulder, initial encounter   2. Rotator cuff syndrome of right shoulder     Plan: I reviewed the MRI again which shows a small partial tear of the supraspinatus. I recommend that he continue with physical therapy for another 6 weeks to see if he continues to improve. If he doesn't have any significant improvement I will like to get another MRI at that time. Hydrocodone was refilled for nighttime.  Follow-Up Instructions: Return in about 6 weeks (around 09/11/2016).   Orders:  No orders of the defined types were placed in this encounter.  Meds ordered this encounter  Medications  . HYDROcodone-acetaminophen (NORCO) 5-325 MG tablet    Sig: Take 1 tablet by mouth daily as needed.    Dispense:  14 tablet    Refill:  0      Procedures: No procedures performed   Clinical Data: No additional findings.   Subjective: Chief Complaint  Patient presents with  . Right Shoulder - Follow-up, Pain    Patient follows up today for rotator cuff syndrome. Status post shoulder dislocation. He is doing physical therapy twice a week. Overall he is improving. He does still have some difficulty with raising his arm above his head. This is better than before. He still has some aching and soreness.    Review of Systems  Constitutional: Negative.   All other systems reviewed and are negative.    Objective: Vital Signs: There were no vitals taken for this visit.  Physical Exam  Constitutional: He is oriented to person, place, and time. He appears well-developed and well-nourished.  Pulmonary/Chest: Effort normal.  Abdominal: Soft.  Neurological: He is alert and  oriented to person, place, and time.  Skin: Skin is warm.  Psychiatric: He has a normal mood and affect. His behavior is normal. Judgment and thought content normal.  Nursing note and vitals reviewed.   Ortho Exam Right shoulder exam shows better active elevation of the arm. He does have pain when lowering the arm. He does have some stiffness in general. He does shrug his shoulder when trying to elevate his arm above his head. Specialty Comments:  No specialty comments available.  Imaging: No results found.   PMFS History: Patient Active Problem List   Diagnosis Date Noted  . Rotator cuff syndrome of right shoulder 07/31/2016  . Closed anterior dislocation of right shoulder 05/29/2016  . Bronchitis, acute 04/18/2015  . Chronic cough 06/03/2013   Past Medical History:  Diagnosis Date  . Chronic fatigue syndrome   . Hypertension   . Osteoporosis   . Prostate cancer [facility])     Family History  Problem Relation Age of Onset  . Prostate cancer Father   . Lung disease Father     Past Surgical History:  Procedure Laterality Date  . ANTERIOR CRUCIATE LIGAMENT REPAIR    . HERNIA REPAIR    . KNEE SURGERY    . PROSTATE SURGERY     Social History   Occupational History  . Not on file.  Social History Main Topics  . Smoking status: Former Smoker    Packs/day: 0.75    Years: 20.00    Types: Cigarettes    Quit date: 03/31/1982  . Smokeless tobacco: Never Used  . Alcohol use No  . Drug use: No  . Sexual activity: Not on file

## 2016-09-11 ENCOUNTER — Ambulatory Visit (INDEPENDENT_AMBULATORY_CARE_PROVIDER_SITE_OTHER): Payer: Medicare HMO | Admitting: Orthopaedic Surgery

## 2016-09-15 ENCOUNTER — Encounter (INDEPENDENT_AMBULATORY_CARE_PROVIDER_SITE_OTHER): Payer: Self-pay | Admitting: Orthopaedic Surgery

## 2016-09-15 ENCOUNTER — Ambulatory Visit (INDEPENDENT_AMBULATORY_CARE_PROVIDER_SITE_OTHER): Payer: Medicare HMO | Admitting: Orthopaedic Surgery

## 2016-09-15 DIAGNOSIS — S43014A Anterior dislocation of right humerus, initial encounter: Secondary | ICD-10-CM | POA: Diagnosis not present

## 2016-09-15 DIAGNOSIS — M75101 Unspecified rotator cuff tear or rupture of right shoulder, not specified as traumatic: Secondary | ICD-10-CM | POA: Diagnosis not present

## 2016-09-15 DIAGNOSIS — M25511 Pain in right shoulder: Secondary | ICD-10-CM

## 2016-09-15 MED ORDER — HYDROCODONE-ACETAMINOPHEN 5-325 MG PO TABS: 1.0000 | ORAL_TABLET | Freq: Every day | ORAL | 0 refills | Status: DC | PRN

## 2016-09-15 NOTE — Progress Notes (Signed)
   Office Visit Note   Patient: Curtis Bell           Date of Birth: [birth year]           MRN: [id number] Visit Date: 09/15/2016              Requested by: Rogers Blocker, MD [address] [place],  [address] PCP: Rogers Blocker, MD   Assessment & Plan: Visit Diagnoses:  1. Acute pain of right shoulder   2. Rotator cuff syndrome of right shoulder   3. Closed anterior dislocation of right shoulder, initial encounter     Plan: At this point patient is still not as good as I would like him to be. I think another MRI would be of value to assess his rotator cuff tear. Follow-up after the MRI.  Follow-Up Instructions: Return in about 2 weeks (around 09/29/2016).   Orders:  Orders Placed This Encounter  Procedures  . MR SHOULDER RIGHT WO CONTRAST   No orders of the defined types were placed in this encounter.     Procedures: No procedures performed   Clinical Data: No additional findings.   Subjective: Chief Complaint  Patient presents with  . Right Shoulder - Routine Post Op    [name] follows up today for his shoulder pain. He states that he is overall better functionally and from a pain standpoint. He still not 100%. He still has trouble raising his arm above his shoulder. He denies any numbness or tingling.    Review of Systems  Constitutional: Negative.   All other systems reviewed and are negative.    Objective: Vital Signs: There were no vitals taken for this visit.  Physical Exam  Constitutional: He is oriented to person, place, and time. He appears well-developed and well-nourished.  Pulmonary/Chest: Effort normal.  Abdominal: Soft.  Neurological: He is alert and oriented to person, place, and time.  Skin: Skin is warm.  Psychiatric: He has a normal mood and affect. His behavior is normal. Judgment and thought content normal.  Nursing note and vitals reviewed.   Ortho Exam Right shoulder exam shows that he is trouble with supraspinatus  function. He has a positive drop arm test. Specialty Comments:  No specialty comments available.  Imaging: No results found.   PMFS History: Patient Active Problem List   Diagnosis Date Noted  . Rotator cuff syndrome of right shoulder 07/31/2016  . Closed anterior dislocation of right shoulder 05/29/2016  . Bronchitis, acute 04/18/2015  . Chronic cough 06/03/2013   Past Medical History:  Diagnosis Date  . Chronic fatigue syndrome   . Hypertension   . Osteoporosis   . Prostate cancer [facility])     Family History  Problem Relation Age of Onset  . Prostate cancer Father   . Lung disease Father     Past Surgical History:  Procedure Laterality Date  . ANTERIOR CRUCIATE LIGAMENT REPAIR    . HERNIA REPAIR    . KNEE SURGERY    . PROSTATE SURGERY     Social History   Occupational History  . Not on file.   Social History Main Topics  . Smoking status: Former Smoker    Packs/day: 0.75    Years: 20.00    Types: Cigarettes    Quit date: 03/31/1982  . Smokeless tobacco: Never Used  . Alcohol use No  . Drug use: No  . Sexual activity: Not on file

## 2016-09-29 ENCOUNTER — Ambulatory Visit (INDEPENDENT_AMBULATORY_CARE_PROVIDER_SITE_OTHER): Payer: Medicare HMO | Admitting: Orthopaedic Surgery

## 2016-09-30 ENCOUNTER — Ambulatory Visit
Admission: RE | Admit: 2016-09-30 | Discharge: 2016-09-30 | Disposition: A | Discharge status: Home / Self Care | Payer: Medicare HMO | Source: Ambulatory Visit | Attending: Orthopaedic Surgery | Admitting: Orthopaedic Surgery

## 2016-09-30 DIAGNOSIS — M25511 Pain in right shoulder: Secondary | ICD-10-CM

## 2016-10-02 ENCOUNTER — Ambulatory Visit (INDEPENDENT_AMBULATORY_CARE_PROVIDER_SITE_OTHER): Payer: Medicare HMO | Admitting: Orthopaedic Surgery

## 2016-10-02 ENCOUNTER — Encounter (INDEPENDENT_AMBULATORY_CARE_PROVIDER_SITE_OTHER): Payer: Self-pay | Admitting: Orthopaedic Surgery

## 2016-10-02 DIAGNOSIS — M75101 Unspecified rotator cuff tear or rupture of right shoulder, not specified as traumatic: Secondary | ICD-10-CM

## 2016-10-02 NOTE — Progress Notes (Signed)
Office Visit Note   Patient: Curtis Bell           Date of Birth: 20-Jun-1950           MRN: [id number] Visit Date: 10/02/2016              Requested by: Rogers Blocker, MD [address] [place], [place] [address] PCP: Rogers Blocker, MD   Assessment & Plan: Visit Diagnoses:  1. Rotator cuff syndrome of right shoulder     Plan: MRI of the right shoulder reveals near full-thickness tear of his supraspinatus. He has a large Hill-Sachs deformity. 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 I reviewed the MRI results with the patient. At this point I recommend arthroscopic repair of the rotator cuff tear along with extensive debridement, acromioplasty, distal clavicle excision, evaluation of the labral tear. We discussed the risks benefits alternatives to surgery and he understands and wishes to proceed.  Follow-Up Instructions: Return if symptoms worsen or fail to improve.   Orders:  No orders of the defined types were placed in this encounter.  No orders of the defined types were placed in this encounter.     Procedures: No procedures performed   Clinical Data: No additional findings.   Subjective: No chief complaint on file.   [name] follows up today to review his MRI of his right shoulder. He still does have functional disability with elevation of his arm.      Review of Systems  Constitutional: Negative.   All other systems reviewed and are negative.    Objective: Vital Signs: There were no vitals taken for this visit.  Physical Exam  Constitutional: He is oriented to person, place, and time. He appears well-developed and well-nourished.  HENT:  Head: Normocephalic and atraumatic.  Eyes: Pupils are equal, round, and reactive to light.  Neck: Neck supple.  Pulmonary/Chest: Effort normal.  Abdominal: Soft.  Musculoskeletal: Normal range of motion.  Neurological: He is alert and oriented to person, place, and time.  Skin: Skin  is warm.  Psychiatric: He has a normal mood and affect. His behavior is normal. Judgment and thought content normal.  Nursing note and vitals reviewed.   Ortho Exam Right shoulder exam shows difficulty with elevation arm. Supraspinatus strength is 3 out of 5. He has significant decreased active range of motion.  Passive range of motion is normal.  Positive impingement signs and positive cross adduction sign. Specialty Comments:  No specialty comments available.  Imaging: No results found.   PMFS History: Patient Active Problem List   Diagnosis Date Noted  . Rotator cuff syndrome of right shoulder 07/31/2016  . Closed anterior dislocation of right shoulder 05/29/2016  . Bronchitis, acute 04/18/2015  . Chronic cough 06/03/2013   Past Medical History:  Diagnosis Date  . Chronic fatigue syndrome   . Hypertension   . Osteoporosis   . Prostate cancer [facility])     Family History  Problem Relation Age of Onset  . Prostate cancer Father   . Lung disease Father     Past Surgical History:  Procedure Laterality Date  . ANTERIOR CRUCIATE LIGAMENT REPAIR    . HERNIA REPAIR    . KNEE SURGERY    . PROSTATE SURGERY     Social History   Occupational History  . Not on file.   Social History Main Topics  . Smoking status: Former Smoker    Packs/day: 0.75    Years: 20.00  Types: Cigarettes    Quit date: 03/31/1982  . Smokeless tobacco: Never Used  . Alcohol use No  . Drug use: No  . Sexual activity: Not on file

## 2016-10-24 DIAGNOSIS — M7541 Impingement syndrome of right shoulder: Secondary | ICD-10-CM | POA: Diagnosis not present

## 2016-10-25 ENCOUNTER — Encounter (INDEPENDENT_AMBULATORY_CARE_PROVIDER_SITE_OTHER): Payer: Self-pay | Admitting: Orthopaedic Surgery

## 2016-10-25 DIAGNOSIS — M7541 Impingement syndrome of right shoulder: Secondary | ICD-10-CM | POA: Insufficient documentation

## 2016-10-27 ENCOUNTER — Telehealth (INDEPENDENT_AMBULATORY_CARE_PROVIDER_SITE_OTHER): Payer: Self-pay | Admitting: Orthopaedic Surgery

## 2016-10-27 MED ORDER — OXYCODONE-ACETAMINOPHEN 5-325 MG PO TABS: 1.0000 | ORAL_TABLET | Freq: Four times a day (QID) | ORAL | 0 refills | Status: DC | PRN

## 2016-10-27 NOTE — Telephone Encounter (Signed)
Patient called needing a refill on oxycodone. CB # [phone]

## 2016-10-27 NOTE — Addendum Note (Signed)
Addended by: Precious Bard on: 10/27/2016 04:58 PM   Modules accepted: Orders

## 2016-10-27 NOTE — Telephone Encounter (Signed)
Please advise 

## 2016-10-27 NOTE — Telephone Encounter (Signed)
30

## 2016-10-27 NOTE — Telephone Encounter (Signed)
Rx pending signature, will be ready for pickup Tuesday morning

## 2016-11-07 ENCOUNTER — Encounter (INDEPENDENT_AMBULATORY_CARE_PROVIDER_SITE_OTHER): Payer: Self-pay | Admitting: Orthopaedic Surgery

## 2016-11-07 ENCOUNTER — Ambulatory Visit (INDEPENDENT_AMBULATORY_CARE_PROVIDER_SITE_OTHER): Payer: Medicare HMO | Admitting: Orthopaedic Surgery

## 2016-11-07 DIAGNOSIS — M75101 Unspecified rotator cuff tear or rupture of right shoulder, not specified as traumatic: Secondary | ICD-10-CM

## 2016-11-07 NOTE — Progress Notes (Signed)
Patient is 2 weeks status post right shoulder arthroscopy and extensive debridement with distal clavicle excision and subacromial decompression. He had a partial articular sided rotator cuff tear.  He comes in today for his first postop visit. He is doing well. The incisions are healed. The sutures were removed. Arthroscopy pictures were reviewed with the patient. Continue physical therapy for strengthening and mobilization. Follow-up in 6 weeks for recheck.

## 2016-12-19 ENCOUNTER — Ambulatory Visit (INDEPENDENT_AMBULATORY_CARE_PROVIDER_SITE_OTHER): Payer: Medicare HMO | Admitting: Orthopaedic Surgery

## 2016-12-19 DIAGNOSIS — M75101 Unspecified rotator cuff tear or rupture of right shoulder, not specified as traumatic: Secondary | ICD-10-CM

## 2016-12-19 NOTE — Progress Notes (Signed)
Patient is 8 weeks status post right shoulder arthroscopy with debridement and distal clavicle excision. He is progressing with physical therapy. His pain is improving. His function is also improving. He mainly complains of some sore spots.  His strength and range of motion are progressing. He is able to elevate his arm past his head now. Supraspinatus continues to be slightly weak. Infraspinatus is normal strength.  From my standpoint he is progressing well with physical therapy. I want him to continue to do this until he plateaus. I would like to recheck him in 6 weeks. Questions encouraged and answered.

## 2016-12-29 ENCOUNTER — Ambulatory Visit (INDEPENDENT_AMBULATORY_CARE_PROVIDER_SITE_OTHER): Payer: Medicare HMO | Admitting: Orthopedic Surgery

## 2017-02-02 ENCOUNTER — Ambulatory Visit (INDEPENDENT_AMBULATORY_CARE_PROVIDER_SITE_OTHER): Payer: Medicare HMO | Admitting: Orthopaedic Surgery

## 2017-02-03 ENCOUNTER — Ambulatory Visit (INDEPENDENT_AMBULATORY_CARE_PROVIDER_SITE_OTHER): Payer: Medicare HMO | Admitting: Orthopaedic Surgery

## 2017-02-03 DIAGNOSIS — M75101 Unspecified rotator cuff tear or rupture of right shoulder, not specified as traumatic: Secondary | ICD-10-CM | POA: Diagnosis not present

## 2017-02-03 NOTE — Progress Notes (Signed)
   Office Visit Note   Patient: Curtis Bell           Date of Birth: November 29, 1950           MRN: [id number] Visit Date: 02/03/2017              Requested by: Rogers Blocker, MD [address] [place],  [address] PCP: Rogers Blocker, MD   Assessment & Plan: Visit Diagnoses:  1. Rotator cuff syndrome of right shoulder     Plan: Impression is 14 weeks status post right shoulder rotator cuff repair.  He is advancing improving very well with physical therapy.  I recommend he continue to do these exercises until their goals.  I will see him back in 2 months for recheck.  Follow-Up Instructions: Return in about 2 months (around 04/05/2017).   Orders:  No orders of the defined types were placed in this encounter.  No orders of the defined types were placed in this encounter.     Procedures: No procedures performed   Clinical Data: No additional findings.   Subjective: No chief complaint on file.   Patient 14 weeks status post right rotator cuff repair.  He is doing much better and doing physical therapy twice a week.  His range of motion and strength have greatly improved.    Review of Systems   Objective: Vital Signs: There were no vitals taken for this visit.  Physical Exam  Ortho Exam Right shoulder exam shows fully healed surgical scars.  There is no swelling.  He has improved forward elevation and abduction and internal rotation and with increased strength with testing.  Overall good function. Specialty Comments:  No specialty comments available.  Imaging: No results found.   PMFS History: Patient Active Problem List   Diagnosis Date Noted  . Impingement syndrome of right shoulder 10/25/2016  . Rotator cuff syndrome of right shoulder 07/31/2016  . Closed anterior dislocation of right shoulder 05/29/2016  . Bronchitis, acute 04/18/2015  . Chronic cough 06/03/2013   Past Medical History:  Diagnosis Date  . Chronic fatigue syndrome   .  Hypertension   . Osteoporosis   . Prostate cancer [facility])     Family History  Problem Relation Age of Onset  . Prostate cancer Father   . Lung disease Father     Past Surgical History:  Procedure Laterality Date  . ANTERIOR CRUCIATE LIGAMENT REPAIR    . HERNIA REPAIR    . KNEE SURGERY    . PROSTATE SURGERY     Social History   Occupational History  . Not on file  Tobacco Use  . Smoking status: Former Smoker    Packs/day: 0.75    Years: 20.00    Pack years: 15.00    Types: Cigarettes    Last attempt to quit: 03/31/1982    Years since quitting: 34.8  . Smokeless tobacco: Never Used  Substance and Sexual Activity  . Alcohol use: No  . Drug use: No  . Sexual activity: Not on file

## 2017-02-11 ENCOUNTER — Telehealth (INDEPENDENT_AMBULATORY_CARE_PROVIDER_SITE_OTHER): Payer: Self-pay | Admitting: Orthopaedic Surgery

## 2017-02-11 NOTE — Telephone Encounter (Signed)
[place] with [facility] called asked if the plan of care form dated 11/25/16 can be faxed back to them. The number to contact [place] is [phone]

## 2017-02-12 NOTE — Telephone Encounter (Signed)
Called her back. Advised her we get so much paper work note sure if it was faxed back. She will refax and I will fax back to her.

## 2017-04-06 ENCOUNTER — Ambulatory Visit (INDEPENDENT_AMBULATORY_CARE_PROVIDER_SITE_OTHER): Payer: Medicare HMO | Admitting: Orthopaedic Surgery

## 2017-04-13 DIAGNOSIS — J324 Chronic pansinusitis: Secondary | ICD-10-CM

## 2017-04-13 HISTORY — DX: Chronic pansinusitis: J32.4

## 2017-06-04 DIAGNOSIS — J342 Deviated nasal septum: Secondary | ICD-10-CM | POA: Insufficient documentation

## 2017-06-04 HISTORY — DX: Deviated nasal septum: J34.2

## 2017-06-22 ENCOUNTER — Other Ambulatory Visit: Payer: Self-pay | Admitting: Orthopedic Surgery

## 2017-06-22 DIAGNOSIS — IMO0002 Reserved for concepts with insufficient information to code with codable children: Secondary | ICD-10-CM

## 2017-06-22 DIAGNOSIS — R229 Localized swelling, mass and lump, unspecified: Principal | ICD-10-CM

## 2017-06-30 ENCOUNTER — Ambulatory Visit
Admission: RE | Admit: 2017-06-30 | Discharge: 2017-06-30 | Disposition: A | Discharge status: Home / Self Care | Payer: Medicare HMO | Source: Ambulatory Visit | Attending: Orthopedic Surgery | Admitting: Orthopedic Surgery

## 2017-06-30 DIAGNOSIS — IMO0002 Reserved for concepts with insufficient information to code with codable children: Secondary | ICD-10-CM

## 2017-06-30 DIAGNOSIS — R229 Localized swelling, mass and lump, unspecified: Principal | ICD-10-CM

## 2017-07-24 DIAGNOSIS — J029 Acute pharyngitis, unspecified: Secondary | ICD-10-CM | POA: Insufficient documentation

## 2017-07-24 DIAGNOSIS — H9202 Otalgia, left ear: Secondary | ICD-10-CM | POA: Insufficient documentation

## 2017-07-29 ENCOUNTER — Other Ambulatory Visit: Payer: Self-pay | Admitting: Otolaryngology

## 2017-07-29 DIAGNOSIS — H9202 Otalgia, left ear: Secondary | ICD-10-CM

## 2017-07-29 DIAGNOSIS — J029 Acute pharyngitis, unspecified: Secondary | ICD-10-CM

## 2017-08-17 ENCOUNTER — Ambulatory Visit
Admission: RE | Admit: 2017-08-17 | Discharge: 2017-08-17 | Disposition: A | Discharge status: Home / Self Care | Payer: Medicare HMO | Source: Ambulatory Visit | Attending: Otolaryngology | Admitting: Otolaryngology

## 2017-08-17 DIAGNOSIS — H9202 Otalgia, left ear: Secondary | ICD-10-CM

## 2017-08-17 DIAGNOSIS — J029 Acute pharyngitis, unspecified: Secondary | ICD-10-CM

## 2017-08-17 MED ORDER — IOHEXOL 300 MG/ML  SOLN: 75.0000 mL | Freq: Once | INTRAMUSCULAR | Status: DC | PRN

## 2017-08-17 MED ORDER — IOPAMIDOL (ISOVUE-300) INJECTION 61%
75.0000 mL | Freq: Once | INTRAVENOUS | Status: AC | PRN
  Administered 2017-08-17: 75 mL via INTRAVENOUS

## 2017-08-31 DIAGNOSIS — J438 Other emphysema: Secondary | ICD-10-CM

## 2017-08-31 DIAGNOSIS — J449 Chronic obstructive pulmonary disease, unspecified: Secondary | ICD-10-CM | POA: Insufficient documentation

## 2017-08-31 HISTORY — DX: Other emphysema: J43.8

## 2017-10-21 ENCOUNTER — Other Ambulatory Visit: Payer: Self-pay

## 2017-10-21 ENCOUNTER — Other Ambulatory Visit: Payer: Self-pay | Admitting: *Deleted

## 2017-10-21 ENCOUNTER — Ambulatory Visit (HOSPITAL_COMMUNITY)
Admission: RE | Admit: 2017-10-21 | Discharge: 2017-10-21 | Disposition: A | Discharge status: Home / Self Care | Payer: Medicare HMO | Source: Ambulatory Visit | Attending: Vascular Surgery | Admitting: Vascular Surgery

## 2017-10-21 DIAGNOSIS — M79605 Pain in left leg: Secondary | ICD-10-CM

## 2017-10-21 DIAGNOSIS — M7989 Other specified soft tissue disorders: Principal | ICD-10-CM

## 2017-10-23 ENCOUNTER — Other Ambulatory Visit (HOSPITAL_COMMUNITY): Payer: Self-pay | Admitting: Internal Medicine

## 2017-10-23 ENCOUNTER — Ambulatory Visit (HOSPITAL_COMMUNITY)
Admission: RE | Admit: 2017-10-23 | Discharge: 2017-10-23 | Disposition: A | Discharge status: Home / Self Care | Payer: Medicare HMO | Source: Ambulatory Visit | Attending: Vascular Surgery | Admitting: Vascular Surgery

## 2017-10-23 DIAGNOSIS — R609 Edema, unspecified: Secondary | ICD-10-CM

## 2017-10-23 DIAGNOSIS — R52 Pain, unspecified: Secondary | ICD-10-CM

## 2017-10-23 DIAGNOSIS — M7989 Other specified soft tissue disorders: Secondary | ICD-10-CM | POA: Diagnosis not present

## 2017-10-23 DIAGNOSIS — M79605 Pain in left leg: Secondary | ICD-10-CM | POA: Diagnosis present

## 2017-10-26 ENCOUNTER — Encounter (HOSPITAL_COMMUNITY): Payer: Medicare HMO

## 2017-12-03 ENCOUNTER — Ambulatory Visit (INDEPENDENT_AMBULATORY_CARE_PROVIDER_SITE_OTHER): Payer: Medicare HMO | Admitting: Ophthalmology

## 2017-12-03 ENCOUNTER — Encounter (INDEPENDENT_AMBULATORY_CARE_PROVIDER_SITE_OTHER): Payer: Self-pay | Admitting: Ophthalmology

## 2017-12-03 DIAGNOSIS — I10 Essential (primary) hypertension: Secondary | ICD-10-CM | POA: Diagnosis not present

## 2017-12-03 DIAGNOSIS — H35351 Cystoid macular degeneration, right eye: Secondary | ICD-10-CM | POA: Diagnosis not present

## 2017-12-03 DIAGNOSIS — Z961 Presence of intraocular lens: Secondary | ICD-10-CM

## 2017-12-03 DIAGNOSIS — H3581 Retinal edema: Secondary | ICD-10-CM | POA: Diagnosis not present

## 2017-12-03 DIAGNOSIS — H35363 Drusen (degenerative) of macula, bilateral: Secondary | ICD-10-CM

## 2017-12-03 DIAGNOSIS — H35033 Hypertensive retinopathy, bilateral: Secondary | ICD-10-CM

## 2017-12-03 DIAGNOSIS — H4323 Crystalline deposits in vitreous body, bilateral: Secondary | ICD-10-CM

## 2017-12-03 DIAGNOSIS — Z9889 Other specified postprocedural states: Secondary | ICD-10-CM

## 2017-12-03 MED ORDER — PREDNISOLONE ACETATE 1 % OP SUSP: 1.0000 [drp] | Freq: Four times a day (QID) | OPHTHALMIC | 0 refills | Status: DC

## 2017-12-03 MED ORDER — KETOROLAC TROMETHAMINE 0.5 % OP SOLN: 1.0000 [drp] | Freq: Four times a day (QID) | OPHTHALMIC | 1 refills | Status: AC

## 2017-12-03 NOTE — Progress Notes (Addendum)
[facility] Note  12/03/2017     CHIEF COMPLAINT Patient presents for Retina Evaluation   HISTORY OF PRESENT ILLNESS: Curtis Bell is a 67 y.o. male who presents to the clinic today for:   HPI    Retina Evaluation    In right eye.  Duration of 2 hours.  Associated Symptoms Floaters.  Negative for Flashes, Blind Spot, Photophobia, Scalp Tenderness, Fever, Weight Loss, Jaw Claudication, Glare, Pain, Distortion, Redness, Trauma, Shoulder/Hip pain and Fatigue.  Context:  distance vision, mid-range vision and near vision.  Treatments tried include no treatments.  Response to treatment was no improvement.  I, the attending physician,  performed the HPI with the patient and updated documentation appropriately.          Comments    Pt presents on the referral of Dr. Calvert Cantor for CME concern, pt saw Dr. Bing Plume this morning, pt states Dr. Bing Plume performed cat sx OD last month, but pt started losing VA a few days ago, pt called [facility] office yesterday and was told to come in this morning, pt denies FOL, pain and wavy vision, but states he does have floaters OU, pt is using gtts he received after sx, pt states he had no problems with cat sx OS       Last edited by Bernarda Caffey, MD on 12/03/2017 11:17 AM. (History)    Pt states he recently had cataract sx OU; Pt states he began "having issues with OD and I called [facility] office and they sent me here"; Pt states he is having trouble seeing license plates when driving; Pt states Dr. Bing Plume told him "my retina was swollen"; Pt states he is using PF and ketorolac OD QD;   Referring physician: Calvert Cantor, MD [address] [place], [place] [address]  HISTORICAL INFORMATION:   Selected notes from the MEDICAL RECORD NUMBER Referred by Dr. Bing Plume for concern of CME OD LEE- 09.05.19 ([facility]) [BCVA OD: 20/60+1 OS: 20/25] Ocular Hx- asteroid OD, pinguecula OU, S/P lasik OU, pseudophakia OU (Toric IOL OU, OD:  08.05.19, OS: 07.17.19) PMH- HTN, hx prostate ca    CURRENT MEDICATIONS: Current Outpatient Medications (Ophthalmic Drugs)  Medication Sig  . ketorolac (ACULAR) 0.5 % ophthalmic solution Place 1 drop into the right eye 4 (four) times daily.  . prednisoLONE acetate (PRED FORTE) 1 % ophthalmic suspension Place 1 drop into the right eye 4 (four) times daily.  Marland Kitchen PROLENSA 0.07 % SOLN    No current facility-administered medications for this visit.  (Ophthalmic Drugs)   Current Outpatient Medications (Other)  Medication Sig  . acetaminophen (TYLENOL ARTHRITIS PAIN) 650 MG CR tablet Per bottle as needed  . amLODipine (NORVASC) 10 MG tablet Take 1 tablet by mouth every morning.  Marland Kitchen aspirin 81 MG tablet Take 81 mg by mouth daily.    . budesonide-formoterol (SYMBICORT) 160-4.5 MCG/ACT inhaler Inhale 2 puffs into the lungs 2 (two) times daily.  . carvedilol (COREG) 12.5 MG tablet 1/2 tab by mouth twice daily  . carvedilol (COREG) 12.5 MG tablet Take by mouth.  . cefdinir (OMNICEF) 300 MG capsule Take by mouth.  . cetirizine (ZYRTEC) 10 MG tablet Take 10 mg by mouth daily.  . Cholecalciferol (D3 HIGH POTENCY) 1000 units capsule Take 1,000 Units by mouth daily.  Marland Kitchen DIALYVITE VITAMIN D3 MAX 24401 units TABS TK 1  T PO 1 TIME A WK  . donepezil (ARICEPT) 10 MG tablet Take 10 mg by mouth  at bedtime.  Marland Kitchen doxycycline (VIBRA-TABS) 100 MG tablet TK 1 T PO BID  . fluticasone (FLONASE) 50 MCG/ACT nasal spray Place 2 sprays into both nostrils daily.  . Glucosamine-Chondroit-Vit C-Mn (GLUCOSAMINE CHONDR 1500 COMPLX) CAPS Take by mouth 2 (two) times daily.  Marland Kitchen HYDROcodone-acetaminophen (NORCO) 5-325 MG tablet Take 1 tablet by mouth every 6 (six) hours as needed. (Patient not taking: Reported on 07/31/2016)  . HYDROcodone-acetaminophen (NORCO) 5-325 MG tablet Take 1 tablet by mouth daily as needed.  Marland Kitchen HYDROcodone-acetaminophen (NORCO/VICODIN) 5-325 MG tablet Take 1 tablet by mouth daily as needed for moderate pain.  Marland Kitchen  ibuprofen (ADVIL,MOTRIN) 600 MG tablet Take by mouth.  . Multiple Vitamin (MULTIVITAMIN) tablet Take 1 tablet by mouth daily.  . Naproxen Sodium (ALEVE) 220 MG CAPS Take 1 capsule by mouth 2 (two) times daily.  . nitroGLYCERIN (NITROSTAT) 0.4 MG SL tablet 1 under tongue every 5 mins x3 if needed for chest pain  . oxyCODONE-acetaminophen (PERCOCET/ROXICET) 5-325 MG tablet Take 1 tablet by mouth every 6 (six) hours as needed.  Marland Kitchen Respiratory Therapy Supplies (FLUTTER) DEVI Use as directed.  . tadalafil (CIALIS) 5 MG tablet Take 5 mg by mouth as needed.   . triamterene-hydrochlorothiazide (DYAZIDE) 37.5-25 MG capsule Take 1 capsule by mouth every morning.  . umeclidinium-vilanterol (ANORO ELLIPTA) 62.5-25 MCG/INH AEPB Inhale 1 puff into the lungs daily.  Penne Lash HFA 45 MCG/ACT inhaler 2 puffs every 6 (six) hours as needed. Reported on 06/05/2015   No current facility-administered medications for this visit.  (Other)      REVIEW OF SYSTEMS: ROS    Positive for: Cardiovascular, Eyes   Negative for: Constitutional, Gastrointestinal, Neurological, Skin, Genitourinary, Musculoskeletal, HENT, Endocrine, Respiratory, Psychiatric, Allergic/Imm, Heme/Lymph   Last edited by Debbrah Alar, COT on 12/03/2017 10:25 AM. (History)       ALLERGIES No Known Allergies  PAST MEDICAL HISTORY Past Medical History:  Diagnosis Date  . Chronic fatigue syndrome   . Hypertension   . Osteoporosis   . Prostate cancer [facility])    Past Surgical History:  Procedure Laterality Date  . ANTERIOR CRUCIATE LIGAMENT REPAIR    . CATARACT EXTRACTION    . HERNIA REPAIR    . KNEE SURGERY    . PROSTATE SURGERY      FAMILY HISTORY Family History  Problem Relation Age of Onset  . Prostate cancer Father   . Lung disease Father   . Cataracts Father   . Amblyopia Neg Hx   . Blindness Neg Hx   . Glaucoma Neg Hx   . Macular degeneration Neg Hx   . Retinal detachment Neg Hx   . Strabismus Neg Hx   . Retinitis  pigmentosa Neg Hx     SOCIAL HISTORY Social History   Tobacco Use  . Smoking status: Former Smoker    Packs/day: 0.75    Years: 20.00    Pack years: 15.00    Types: Cigarettes    Last attempt to quit: 03/31/1982    Years since quitting: 35.7  . Smokeless tobacco: Never Used  Substance Use Topics  . Alcohol use: No  . Drug use: No         OPHTHALMIC EXAM:  Base Eye Exam    Visual Acuity (Snellen - Linear)      Right Left   Dist [place] 20/60 -2 20/30   Dist ph [place] NI NI       Tonometry (Tonopen, 10:31 AM)      Right Left  Pressure 12 14       Pupils      Dark Light Shape React APD   Right 6 6 Round NR None   Left 6 6 Round NR None       Visual Fields (Counting fingers)      Left Right    Full Full       Extraocular Movement      Right Left    Full, Ortho Full, Ortho       Neuro/Psych    Oriented x3:  Yes   Mood/Affect:  Normal       Dilation    Both eyes:  1.0% Mydriacyl, 2.5% Phenylephrine @ 10:31 AM        Slit Lamp and Fundus Exam    Slit Lamp Exam      Right Left   Lids/Lashes Dermatochalasis - upper lid, Meibomian gland dysfunction Dermatochalasis - upper lid, Meibomian gland dysfunction, Telangiectasia, Ptosis   Conjunctiva/Sclera White and quiet White and quiet   Cornea Arcus, Temporal Well healed cataract wounds, lasik flap Arcus, Temporal Well healed cataract wounds, lasik flap   Anterior Chamber Deep and quiet, no cell or flare Deep and quiet, no cell or flare   Iris Round and dilated Round and dilated   Lens Toric PC IOL in good position with marks visible at 0530, trace Posterior capsular opacification PC IOL in good position, trace Posterior capsular opacification   Vitreous Vitreous syneresis, Asteroid hyalosis Vitreous syneresis, Asteroid hyalosis       Fundus Exam      Right Left   Disc Pink and Sharp, temporal PPP Pink and Sharp   C/D Ratio 0.4 0.3   Macula Blunted foveal reflex, central cystic changes, No heme or edema Good  foveal reflex, RPE mottling and clumping   Vessels Tortuous, AV crossing changes Vascular attenuation, Tortuous, AV crossing changes   Periphery Attached Attached        Refraction    Manifest Refraction      Sphere Cylinder Axis Dist VA   Right -0.50 +0.50 180 20/60   Left -0.50 +0.75 180 20/25          IMAGING AND PROCEDURES  Imaging and Procedures for @TODAY @  OCT, Retina - OU - Both Eyes       Right Eye Quality was good. Central Foveal Thickness: 455. Progression has no prior data. Findings include subretinal fluid, intraretinal fluid, retinal drusen , abnormal foveal contour.   Left Eye Quality was good. Central Foveal Thickness: 344. Progression has no prior data. Findings include normal foveal contour, retinal drusen , no IRF, no SRF (Trace ERM and blunted foveal contour).   Notes *Images captured and stored on drive  Diagnosis / Impression:  OD: mild CME OS: NFP, No IRF/SRF  Clinical management:  See below  Abbreviations: NFP - Normal foveal profile. CME - cystoid macular edema. PED - pigment epithelial detachment. IRF - intraretinal fluid. SRF - subretinal fluid. EZ - ellipsoid zone. ERM - epiretinal membrane. ORA - outer retinal atrophy. ORT - outer retinal tubulation. SRHM - subretinal hyper-reflective material                  ASSESSMENT/PLAN:    ICD-10-CM   1. 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 Retinal edema H35.81 OCT, Retina - OU - Both Eyes  3. 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 Essential hypertension I10   5. Hypertensive retinopathy of both eyes H35.033  6. 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 Pseudophakia of both eyes Z96.1   8. Hx of LASIK Z98.890     1,2.  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 Retinal Drusen OU - monitor  4,5. Hypertensive retinopathy OU - discussed importance of tight BP control - monitor  6. Asteroid hyalosis OU-  - monitor  7. Pseudophakia OU   - s/p CE/ Toric IOL by expert surgeon, Dr. Bing Plume  - OD: 08.05.19, OS: 07.17.19)  - beautiful surgeries  - mild CME OD as above  - monitor  6. Hx of Lasik OU-  - doing well  - montior  Ophthalmic Meds Ordered this visit:  Meds ordered this encounter  Medications  . prednisoLONE acetate (PRED FORTE) 1 % ophthalmic suspension    Sig: Place 1 drop into the right eye 4 (four) times daily.    Dispense:  10 mL    Refill:  0  . ketorolac (ACULAR) 0.5 % ophthalmic solution    Sig: Place 1 drop into the right eye 4 (four) times daily.    Dispense:  10 mL    Refill:  1       Return in about 4 weeks (around 12/31/2017) for F/U CME OD, DFE, OCT, poss FA.  There are no Patient Instructions on file for this visit.   Explained the diagnoses, plan, and follow up with the patient and they expressed understanding.  Patient expressed understanding of the importance of proper follow up care.   This document serves as a record of services personally performed by Gardiner Sleeper, MD, PhD. It was created on their behalf by Catha Brow, [place], a certified ophthalmic assistant. The creation of this record is the provider's dictation and/or activities during the visit.  Electronically signed by: Catha Brow, COA  09.05.19 11:23 PM   Gardiner Sleeper, M.D., Ph.D. Diseases & Surgery of the Retina and Vitreous [facility]  I have reviewed the above documentation for accuracy and completeness, and I agree with the above. Gardiner Sleeper, M.D., Ph.D. 12/03/17 11:23 PM     Abbreviations: M myopia (nearsighted); A astigmatism; H hyperopia (farsighted); P presbyopia; Mrx spectacle  prescription;  CTL contact lenses; OD right eye; OS left eye; OU both eyes  XT exotropia; ET esotropia; PEK punctate epithelial keratitis; PEE punctate epithelial erosions; DES dry eye syndrome; MGD meibomian gland dysfunction; ATs artificial tears; PFAT's preservative free artificial tears; [place] nuclear sclerotic cataract; PSC posterior subcapsular cataract; ERM epi-retinal membrane; PVD posterior vitreous detachment; RD retinal detachment; DM diabetes mellitus; DR diabetic retinopathy; NPDR non-proliferative diabetic retinopathy; PDR proliferative diabetic retinopathy; CSME clinically significant macular edema; DME diabetic macular edema; dbh dot blot hemorrhages; CWS cotton wool spot; POAG primary open angle glaucoma; C/D cup-to-disc ratio; HVF humphrey visual field; GVF goldmann visual field; OCT optical coherence tomography; IOP intraocular pressure; BRVO Branch retinal vein occlusion; CRVO central retinal vein occlusion; CRAO central retinal artery occlusion; BRAO branch retinal artery occlusion; RT retinal tear; SB scleral buckle; PPV pars plana vitrectomy; VH Vitreous hemorrhage; PRP panretinal laser photocoagulation; IVK intravitreal kenalog; VMT vitreomacular traction; MH Macular  hole;  NVD neovascularization of the disc; NVE neovascularization elsewhere; AREDS age related eye disease study; ARMD age related macular degeneration; POAG primary open angle glaucoma; EBMD epithelial/anterior basement membrane dystrophy; ACIOL anterior chamber intraocular lens; IOL intraocular lens; PCIOL posterior chamber intraocular lens; Phaco/IOL phacoemulsification with intraocular lens placement; [place] photorefractive keratectomy; LASIK laser assisted in situ keratomileusis; HTN hypertension; DM diabetes mellitus; COPD chronic obstructive pulmonary disease

## 2017-12-16 ENCOUNTER — Ambulatory Visit (INDEPENDENT_AMBULATORY_CARE_PROVIDER_SITE_OTHER): Payer: Medicare HMO | Admitting: Orthopaedic Surgery

## 2017-12-16 ENCOUNTER — Encounter (INDEPENDENT_AMBULATORY_CARE_PROVIDER_SITE_OTHER): Payer: Self-pay | Admitting: Orthopaedic Surgery

## 2017-12-16 DIAGNOSIS — Z9889 Other specified postprocedural states: Secondary | ICD-10-CM

## 2017-12-16 NOTE — Progress Notes (Signed)
     Patient: Curtis Bell           Date of Birth: Sep 12, 1950           MRN: [id number] Visit Date: 12/16/2017 PCP: Rogers Blocker, MD   Assessment & Plan:  Chief Complaint:  Chief Complaint  Patient presents with  . Right Shoulder - Pain   Visit Diagnoses:  1. S/P arthroscopy of right shoulder     Plan: Patient is a pleasant 67 year old gentleman who presents to our clinic today a little over 1 year out right shoulder arthroscopic debridement, distal clavicle excision and  Questionable rotator cuff repair.  He has been doing fairly well.  He denies any pain.  He has regained full motion and strength.  He would like to know at this point if he could advance with activity to include push-ups.  Examination of his right shoulder reveals full active range of motion all planes.  Full strength throughout.  He is neurovascularly intact distally.  At this point, the patient can advance with activity as tolerated.  He will follow-up with [place] as needed.  Call with concerns or questions in the meantime.  Follow-Up Instructions: Return if symptoms worsen or fail to improve.   Orders:  No orders of the defined types were placed in this encounter.  No orders of the defined types were placed in this encounter.   Imaging: No results found.  PMFS History: Patient Active Problem List   Diagnosis Date Noted  . S/P arthroscopy of right shoulder 12/16/2017  . Impingement syndrome of right shoulder 10/25/2016  . Rotator cuff syndrome of right shoulder 07/31/2016  . Closed anterior dislocation of right shoulder 05/29/2016  . Bronchitis, acute 04/18/2015  . Chronic cough 06/03/2013   Past Medical History:  Diagnosis Date  . Chronic fatigue syndrome   . Hypertension   . Osteoporosis   . Prostate cancer [facility])     Family History  Problem Relation Age of Onset  . Prostate cancer Father   . Lung disease Father   . Cataracts Father   . Amblyopia Neg Hx   . Blindness Neg Hx   . Glaucoma Neg  Hx   . Macular degeneration Neg Hx   . Retinal detachment Neg Hx   . Strabismus Neg Hx   . Retinitis pigmentosa Neg Hx     Past Surgical History:  Procedure Laterality Date  . ANTERIOR CRUCIATE LIGAMENT REPAIR    . CATARACT EXTRACTION    . HERNIA REPAIR    . KNEE SURGERY    . PROSTATE SURGERY     Social History   Occupational History  . Not on file  Tobacco Use  . Smoking status: Former Smoker    Packs/day: 0.75    Years: 20.00    Pack years: 15.00    Types: Cigarettes    Last attempt to quit: 03/31/1982    Years since quitting: 35.7  . Smokeless tobacco: Never Used  Substance and Sexual Activity  . Alcohol use: No  . Drug use: No  . Sexual activity: Not on file

## 2017-12-28 ENCOUNTER — Other Ambulatory Visit (HOSPITAL_COMMUNITY): Payer: Self-pay | Admitting: Urology

## 2017-12-28 DIAGNOSIS — R9721 Rising PSA following treatment for malignant neoplasm of prostate: Secondary | ICD-10-CM

## 2017-12-31 ENCOUNTER — Encounter (INDEPENDENT_AMBULATORY_CARE_PROVIDER_SITE_OTHER): Payer: Medicare HMO | Admitting: Ophthalmology

## 2018-01-11 ENCOUNTER — Encounter (HOSPITAL_COMMUNITY)
Admission: RE | Admit: 2018-01-11 | Discharge: 2018-01-11 | Disposition: A | Discharge status: Home / Self Care | Payer: Medicare HMO | Source: Ambulatory Visit | Attending: Urology | Admitting: Urology

## 2018-01-11 DIAGNOSIS — R948 Abnormal results of function studies of other organs and systems: Secondary | ICD-10-CM | POA: Diagnosis not present

## 2018-01-11 DIAGNOSIS — R9721 Rising PSA following treatment for malignant neoplasm of prostate: Secondary | ICD-10-CM | POA: Insufficient documentation

## 2018-01-11 MED ORDER — TECHNETIUM TC 99M MEDRONATE IV KIT
21.5000 | PACK | Freq: Once | INTRAVENOUS | Status: AC | PRN
  Administered 2018-01-11: 21.5 via INTRAVENOUS

## 2018-03-22 ENCOUNTER — Other Ambulatory Visit (HOSPITAL_COMMUNITY): Payer: Self-pay | Admitting: Cardiology

## 2018-03-22 ENCOUNTER — Other Ambulatory Visit: Payer: Self-pay | Admitting: Cardiology

## 2018-03-22 DIAGNOSIS — R079 Chest pain, unspecified: Secondary | ICD-10-CM

## 2018-04-02 ENCOUNTER — Encounter (HOSPITAL_COMMUNITY)
Admission: RE | Admit: 2018-04-02 | Discharge: 2018-04-02 | Disposition: A | Discharge status: Home / Self Care | Payer: Medicare HMO | Source: Ambulatory Visit | Attending: Cardiology | Admitting: Cardiology

## 2018-04-02 DIAGNOSIS — R079 Chest pain, unspecified: Secondary | ICD-10-CM

## 2018-04-02 MED ORDER — REGADENOSON 0.4 MG/5ML IV SOLN
INTRAVENOUS | Status: AC
  Filled 2018-04-02: qty 5

## 2018-04-02 MED ORDER — REGADENOSON 0.4 MG/5ML IV SOLN
0.4000 mg | Freq: Once | INTRAVENOUS | Status: AC
  Administered 2018-04-02: 0.4 mg via INTRAVENOUS

## 2018-04-02 MED ORDER — TECHNETIUM TC 99M TETROFOSMIN IV KIT
10.0000 | PACK | Freq: Once | INTRAVENOUS | Status: AC | PRN
  Administered 2018-04-02: 10 via INTRAVENOUS

## 2018-04-02 MED ORDER — TECHNETIUM TC 99M TETROFOSMIN IV KIT
30.0000 | PACK | Freq: Once | INTRAVENOUS | Status: AC | PRN
  Administered 2018-04-02: 30 via INTRAVENOUS

## 2018-04-15 ENCOUNTER — Other Ambulatory Visit: Payer: Self-pay | Admitting: Internal Medicine

## 2018-04-15 DIAGNOSIS — J329 Chronic sinusitis, unspecified: Secondary | ICD-10-CM

## 2018-04-16 ENCOUNTER — Other Ambulatory Visit: Payer: Self-pay | Admitting: Internal Medicine

## 2018-04-20 ENCOUNTER — Ambulatory Visit
Admission: RE | Admit: 2018-04-20 | Discharge: 2018-04-20 | Disposition: A | Discharge status: Home / Self Care | Payer: Medicare HMO | Source: Ambulatory Visit | Attending: Internal Medicine | Admitting: Internal Medicine

## 2018-04-20 DIAGNOSIS — J329 Chronic sinusitis, unspecified: Secondary | ICD-10-CM

## 2018-06-04 ENCOUNTER — Ambulatory Visit
Admission: RE | Admit: 2018-06-04 | Discharge: 2018-06-04 | Disposition: A | Discharge status: Home / Self Care | Payer: Medicare HMO | Source: Ambulatory Visit | Attending: Internal Medicine | Admitting: Internal Medicine

## 2018-06-04 ENCOUNTER — Other Ambulatory Visit: Payer: Self-pay | Admitting: Internal Medicine

## 2018-06-04 DIAGNOSIS — R05 Cough: Secondary | ICD-10-CM

## 2018-06-04 DIAGNOSIS — R059 Cough, unspecified: Secondary | ICD-10-CM

## 2018-07-06 ENCOUNTER — Encounter: Payer: Self-pay | Admitting: Neurology

## 2018-07-08 ENCOUNTER — Encounter: Payer: Self-pay | Admitting: Neurology

## 2018-07-16 ENCOUNTER — Ambulatory Visit: Payer: Medicare HMO | Admitting: Neurology

## 2018-07-20 ENCOUNTER — Other Ambulatory Visit: Payer: Self-pay

## 2018-07-26 NOTE — Progress Notes (Signed)
Voice mail left for pt to call back to go over medication and update chart before tomorrows appointment

## 2018-07-27 ENCOUNTER — Telehealth: Payer: Self-pay | Admitting: Neurology

## 2018-07-27 ENCOUNTER — Other Ambulatory Visit: Payer: Self-pay

## 2018-07-27 ENCOUNTER — Telehealth (INDEPENDENT_AMBULATORY_CARE_PROVIDER_SITE_OTHER): Payer: Medicare HMO | Admitting: Neurology

## 2018-07-27 ENCOUNTER — Telehealth: Payer: Self-pay

## 2018-07-27 VITALS — BP 157/90 | HR 63 | Ht 67.0 in | Wt 224.0 lb

## 2018-07-27 DIAGNOSIS — C61 Malignant neoplasm of prostate: Secondary | ICD-10-CM | POA: Insufficient documentation

## 2018-07-27 DIAGNOSIS — G3184 Mild cognitive impairment, so stated: Secondary | ICD-10-CM | POA: Diagnosis not present

## 2018-07-27 NOTE — Telephone Encounter (Signed)
Pt called and I was given verbal consent to fax pt notes from today's visit to the [place]
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
## 2018-07-27 NOTE — Telephone Encounter (Signed)
Patient lmom returning your call. Thanks

## 2018-07-27 NOTE — Telephone Encounter (Signed)
[facility] and [facility] regarding this [facility] and needing his note from today faxed to [phone].  Thanks

## 2018-07-27 NOTE — Telephone Encounter (Signed)
Message left for pt informing him trying to  get in touch with him to go over medication and update his chart before his 9:00 appointment. Call is being sent to voice mail, MD has been informed will try to call pt again

## 2018-08-03 ENCOUNTER — Other Ambulatory Visit: Payer: Self-pay | Admitting: Internal Medicine

## 2018-08-03 ENCOUNTER — Ambulatory Visit
Admission: RE | Admit: 2018-08-03 | Discharge: 2018-08-03 | Disposition: A | Discharge status: Home / Self Care | Payer: Medicare HMO | Source: Ambulatory Visit | Attending: Internal Medicine | Admitting: Internal Medicine

## 2018-08-03 ENCOUNTER — Other Ambulatory Visit: Payer: Self-pay

## 2018-08-03 DIAGNOSIS — R109 Unspecified abdominal pain: Secondary | ICD-10-CM

## 2019-01-24 DIAGNOSIS — K219 Gastro-esophageal reflux disease without esophagitis: Secondary | ICD-10-CM

## 2019-01-24 HISTORY — DX: Gastro-esophageal reflux disease without esophagitis: K21.9

## 2019-01-28 ENCOUNTER — Other Ambulatory Visit: Payer: Self-pay | Admitting: Internal Medicine

## 2019-01-28 DIAGNOSIS — R079 Chest pain, unspecified: Secondary | ICD-10-CM

## 2019-01-28 DIAGNOSIS — R0781 Pleurodynia: Secondary | ICD-10-CM

## 2019-02-01 ENCOUNTER — Other Ambulatory Visit: Payer: Medicare HMO

## 2019-02-10 ENCOUNTER — Ambulatory Visit
Admission: RE | Admit: 2019-02-10 | Discharge: 2019-02-10 | Disposition: A | Discharge status: Home / Self Care | Payer: Medicare HMO | Source: Ambulatory Visit | Attending: Internal Medicine | Admitting: Internal Medicine

## 2019-02-10 ENCOUNTER — Other Ambulatory Visit: Payer: Self-pay | Admitting: Internal Medicine

## 2019-02-10 DIAGNOSIS — R079 Chest pain, unspecified: Secondary | ICD-10-CM

## 2019-03-10 ENCOUNTER — Encounter: Payer: Self-pay | Admitting: Neurology

## 2019-03-11 ENCOUNTER — Telehealth (INDEPENDENT_AMBULATORY_CARE_PROVIDER_SITE_OTHER): Payer: Medicare HMO | Admitting: Neurology

## 2019-03-11 ENCOUNTER — Other Ambulatory Visit: Payer: Self-pay

## 2019-03-11 ENCOUNTER — Telehealth: Payer: Self-pay

## 2019-03-11 VITALS — Ht 67.0 in | Wt 225.0 lb

## 2019-03-11 DIAGNOSIS — G3184 Mild cognitive impairment, so stated: Secondary | ICD-10-CM | POA: Diagnosis not present

## 2019-03-11 MED ORDER — MEMANTINE HCL 10 MG PO TABS: 10.0000 mg | ORAL_TABLET | Freq: Two times a day (BID) | ORAL | 3 refills | Status: AC

## 2019-03-11 NOTE — Telephone Encounter (Signed)
Called [phone] ext [phone] and was given the name Solmon Ice and number [phone] to contact for labs. Left message for Solmon Ice to return call. We did receive referral packet, however if labs were completed with the [place] we did not receive them.
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
## 2019-03-17 ENCOUNTER — Other Ambulatory Visit (HOSPITAL_COMMUNITY): Payer: Self-pay | Admitting: Urology

## 2019-03-17 DIAGNOSIS — R9721 Rising PSA following treatment for malignant neoplasm of prostate: Secondary | ICD-10-CM

## 2019-03-29 ENCOUNTER — Other Ambulatory Visit: Payer: Self-pay

## 2019-03-29 ENCOUNTER — Ambulatory Visit (HOSPITAL_COMMUNITY)
Admission: RE | Admit: 2019-03-29 | Discharge: 2019-03-29 | Disposition: A | Discharge status: Home / Self Care | Payer: Medicare HMO | Source: Ambulatory Visit | Attending: Urology | Admitting: Urology

## 2019-03-29 ENCOUNTER — Telehealth: Payer: Self-pay | Admitting: Neurology

## 2019-03-29 DIAGNOSIS — R9721 Rising PSA following treatment for malignant neoplasm of prostate: Secondary | ICD-10-CM | POA: Insufficient documentation

## 2019-03-29 MED ORDER — AXUMIN (FLUCICLOVINE F 18) INJECTION: 10.1000 | Freq: Once | INTRAVENOUS | Status: DC | PRN

## 2019-03-29 NOTE — Telephone Encounter (Signed)
Curtis Bell from the [place] called to let Dr. Delice Lesch know the MRI has already been authorized and can be done at [facility]. The auth number is: [id number].  Additionally, the patient is scheduled for neuropsychological testing at the [place] on 05/06/19.

## 2019-03-30 ENCOUNTER — Other Ambulatory Visit: Payer: Self-pay

## 2019-03-30 DIAGNOSIS — G3184 Mild cognitive impairment, so stated: Secondary | ICD-10-CM

## 2019-03-30 NOTE — Telephone Encounter (Signed)
MRI brain ordered and Neuropsych ordered with Melvyn Novas.

## 2019-04-05 ENCOUNTER — Ambulatory Visit
Admission: RE | Admit: 2019-04-05 | Discharge: 2019-04-05 | Disposition: A | Discharge status: Home / Self Care | Payer: Medicare HMO | Source: Ambulatory Visit | Attending: Radiation Oncology | Admitting: Radiation Oncology

## 2019-04-05 DIAGNOSIS — C61 Malignant neoplasm of prostate: Secondary | ICD-10-CM

## 2019-04-05 NOTE — Progress Notes (Signed)
GU Location of Tumor / Histology: prostatic adenocarcinoma  If Prostate Cancer, Gleason Score is (3 + 4) and PSA is (7.04) at diagnosis. Prostate volume at diagnosis 45.18 cc.  Curtis Bell is a former patient of Dr. 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 His PSA was never truly undetectable following surgery and started to slowly rise 2 years later.      Past/Anticipated interventions by urology, if any:   03/29/2019 PET Impression:   Past/Anticipated interventions by medical oncology, if any:   Weight changes, if any:   Bowel/Bladder complaints, if any: Reports urinary leakage.   Nausea/Vomiting, if any:   Pain issues, if any:    SAFETY ISSUES:  Prior radiation?   Pacemaker/ICD?   Possible current pregnancy? no, male patient  Is the patient on methotrexate?   Current Complaints / other details:  69 year old male. Married. Father with history of prostate ca. Smoked 1 ppd for 19 years.

## 2019-04-06 ENCOUNTER — Telehealth: Payer: Self-pay | Admitting: Radiation Oncology

## 2019-04-06 NOTE — Telephone Encounter (Signed)
Patient not available for consult today via telephone. Received voicemail message from patient approximately two hours s/p his scheduled appointment requesting a return call. Phoned patient back and explained his appointment would be rescheduled. Explained one of our schedulers would be in contact with him in the next 24-48 hours. Patient verbalized understanding.

## 2019-04-07 ENCOUNTER — Telehealth: Payer: Self-pay | Admitting: *Deleted

## 2019-04-07 NOTE — Telephone Encounter (Signed)
LVM to Curtis Bell for a callback to reschedule missed telephone encounter.

## 2019-04-19 ENCOUNTER — Ambulatory Visit
Admission: RE | Admit: 2019-04-19 | Discharge: 2019-04-19 | Disposition: A | Discharge status: Home / Self Care | Payer: Medicare HMO | Source: Ambulatory Visit | Attending: Radiation Oncology | Admitting: Radiation Oncology

## 2019-04-19 ENCOUNTER — Other Ambulatory Visit: Payer: Self-pay

## 2019-04-19 ENCOUNTER — Encounter: Payer: Self-pay | Admitting: Radiation Oncology

## 2019-04-19 VITALS — Ht 67.0 in | Wt 225.0 lb

## 2019-04-19 DIAGNOSIS — R9721 Rising PSA following treatment for malignant neoplasm of prostate: Secondary | ICD-10-CM

## 2019-04-19 DIAGNOSIS — C61 Malignant neoplasm of prostate: Secondary | ICD-10-CM
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
## 2019-04-19 NOTE — Progress Notes (Signed)
Radiation Oncology         [phone] ________________________________  Initial outpatient Consultation - Conducted via Telephone due to current COVID-19 concerns for limiting patient exposure  Name: Curtis Bell MRN: [id number]  Date: 04/19/2019  DOB: Mar 30, 1951  [id number], Curtis Littler, MD  Davis Gourd*   REFERRING PHYSICIAN: Davis Gourd*  DIAGNOSIS: 69 y.o. gentleman with rising, detectable PSA of 3.10 s/p RALP in 2003 for Stage pT2c, pN0 Gleason 3+4 prostate cancer with pretreatment PSA of 7.64.    ICD-10-CM   1. Malignant neoplasm of prostate ([place])  C61   2. Biochemically recurrent malignant neoplasm of prostate  R97.21     HISTORY OF PRESENT ILLNESS: Curtis Bell is a 69 y.o. male with a diagnosis of biochemically recurrent prostate cancer. He was initially diagnosed with Gleason 3+4 disease by Dr. Gaynelle Arabian on 02/18/2001 with PSA of 7.64. He elected to proceed with prostatectomy for management of his prostate cancer and had a radical retropubic prostatectomy performed by Dr. Gaynelle Arabian on 04/26/2001 which confirmed pT2c, N0, Gleason 3+4 disease confined to the prostate. 9 of 9 sampled lymph nodes were negative for malignancy and margins were negative but there was an extremely close left apical margin. Unfortunately, his postoperative PSA was never truly undetectable and started to slowly rise 2 years thereafter. Per notes from [facility], the patient received a Lupron injection in 2004 with an appropriate PSA response and was continued on intermittent therapy for a short time. Disease restaging scans since that time, from 2007 through 2020, have remained negative for evidence of metastatic disease.  With Dr. Arlyn Leak retirement, the patient's care was transferred to Dr. Lovena Neighbours in 06/2017. His PSA has started to slowly rise-- 06/2014 - 2.11 06/2015 - 2.78 05/2016 - 2.73 06/2017 - 2.36 11/2017 - 2.61 08/2018 - 2.40 03/2019 - 3.10  Most recent  Axumin PET scan performed on 03/29/2019 was negative for local prostate cancer recurrence, nodal, soft tissue, or skeletal metastasis. He is scheduled for brain MRI on 04/25/2019 to complete his disease restaging.  The patient reviewed the biopsy results with his urologist and he has kindly been referred today for discussion of potential radiation treatment options.  PREVIOUS RADIATION THERAPY: No  PAST MEDICAL HISTORY:  Past Medical History:  Diagnosis Date  . Chronic fatigue syndrome   . Hypertension   . Osteoporosis   . Prostate cancer ([place])       PAST SURGICAL HISTORY: Past Surgical History:  Procedure Laterality Date  . ANTERIOR CRUCIATE LIGAMENT REPAIR    . CATARACT EXTRACTION    . HERNIA REPAIR    . KNEE SURGERY    . PROSTATE SURGERY      FAMILY HISTORY:  Family History  Problem Relation Age of Onset  . Prostate cancer Father        lived to be 41 years old  . Lung disease Father   . Cataracts Father   . Amblyopia Neg Hx   . Blindness Neg Hx   . Glaucoma Neg Hx   . Macular degeneration Neg Hx   . Retinal detachment Neg Hx   . Strabismus Neg Hx   . Retinitis pigmentosa Neg Hx   . Breast cancer Neg Hx   . Colon cancer Neg Hx   . Pancreatic cancer Neg Hx     SOCIAL HISTORY:  Social History   Socioeconomic History  . Marital status: Married    Spouse name: Peter Congo  . Number of children: 2  . Years  of education: Not on file  . Highest education level: Not on file  Occupational History    Comment: retired  Tobacco Use  . Smoking status: Former Smoker    Packs/day: 0.75    Years: 20.00    Pack years: 15.00    Types: Cigarettes    Quit date: 03/31/1982    Years since quitting: 37.0  . Smokeless tobacco: Never Used  Substance and Sexual Activity  . Alcohol use: No  . Drug use: No  . Sexual activity: Not Currently  Other Topics Concern  . Not on file  Social History Narrative   Left handed      Highest level of edu- HS      One story home with wife  and son   Social Determinants of Health   Financial Resource Strain:   . Difficulty of Paying Living Expenses: Not on file  Food Insecurity:   . Worried About Charity fundraiser in the Last Year: Not on file  . Ran Out of Food in the Last Year: Not on file  Transportation Needs:   . Lack of Transportation (Medical): Not on file  . Lack of Transportation (Non-Medical): Not on file  Physical Activity:   . Days of Exercise per Week: Not on file  . Minutes of Exercise per Session: Not on file  Stress:   . Feeling of Stress : Not on file  Social Connections:   . Frequency of Communication with Friends and Family: Not on file  . Frequency of Social Gatherings with Friends and Family: Not on file  . Attends Religious Services: Not on file  . Active Member of Clubs or Organizations: Not on file  . Attends Archivist Meetings: Not on file  . Marital Status: Not on file  Intimate Partner Violence:   . Fear of Current or Ex-Partner: Not on file  . Emotionally Abused: Not on file  . Physically Abused: Not on file  . Sexually Abused: Not on file    ALLERGIES: Patient has no known allergies.  MEDICATIONS:  Current Outpatient Medications  Medication Sig Dispense Refill  . amLODipine (NORVASC) 10 MG tablet Take 0.5 tablets by mouth every morning.   2  . Ascorbic Acid (VITAMIN C) 1000 MG tablet Take 1,000 mg by mouth daily. Plus Vitamin E    . aspirin 81 MG tablet Take 81 mg by mouth daily.      . [place]wellia-Glucosamine-Vit D (OSTEO BI-FLEX ONE PER DAY PO) Take by mouth.    . budesonide-formoterol (SYMBICORT) 160-4.5 MCG/ACT inhaler Inhale 2 puffs into the lungs 2 (two) times daily. 1 Inhaler 3  . carvedilol (COREG) 12.5 MG tablet 12.5 mg. twice daily  3  . cetirizine (ZYRTEC) 10 MG tablet Take 10 mg by mouth daily.    . Cholecalciferol (VITAMIN D3) 1.25 MG (50000 UT) CAPS TK 1 C PO 1 TIME A WK    . furosemide (LASIX) 40 MG tablet Take 40 mg by mouth.    . Ginger, Zingiber  officinalis, (GINGER PO) Take by mouth.    . memantine (NAMENDA) 10 MG tablet Take 1 tablet (10 mg total) by mouth 2 (two) times daily. 180 tablet 3  . Probiotic Product (CULTURELLE PROBIOTICS PO) Take by mouth.    . Turmeric (QC TUMERIC COMPLEX PO) Take by mouth.    Marland Kitchen acetaminophen (TYLENOL ARTHRITIS PAIN) 650 MG CR tablet Per bottle as needed    . albuterol (VENTOLIN HFA) 108 (90 Base) MCG/ACT inhaler Inhale  into the lungs every 6 (six) hours as needed for wheezing or shortness of breath. Patient has run out of this medication    . fluticasone (FLONASE) 50 MCG/ACT nasal spray Place 2 sprays into both nostrils daily. (Patient not taking: Reported on 04/19/2019) 29.7 g 1  . ibuprofen (ADVIL,MOTRIN) 600 MG tablet Take by mouth.    . losartan (COZAAR) 50 MG tablet Take 50 mg by mouth daily.    . Multiple Vitamin (MULTIVITAMIN) tablet Take 1 tablet by mouth daily.    . Naproxen Sodium (ALEVE) 220 MG CAPS Take 1 capsule by mouth 2 (two) times daily.    . nitroGLYCERIN (NITROSTAT) 0.4 MG SL tablet 1 under tongue every 5 mins x3 if needed for chest pain  3  . Respiratory Therapy Supplies (FLUTTER) DEVI Use as directed. (Patient not taking: Reported on 04/19/2019) 1 each 0  . tadalafil (CIALIS) 5 MG tablet Take 5 mg by mouth as needed.     . tamsulosin (FLOMAX) 0.4 MG CAPS capsule Take 0.4 mg by mouth daily. Not taking. Ran out of the medication.     No current facility-administered medications for this encounter.    REVIEW OF SYSTEMS:  On review of systems, the patient reports that he is doing well overall. He denies any chest pain, shortness of breath, cough, fevers, chills, night sweats, unintended weight changes. He denies any bowel disturbances, and denies abdominal pain, nausea or vomiting. He denies any new musculoskeletal or joint aches or pains. His IPSS was 6, indicating mild urinary symptoms. He reports occasional urinary leakage related to certain movements but otherwise feels that his LUTS are  well controlled on Flomax every other day. 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 A complete review of systems is obtained and is otherwise negative.    PHYSICAL EXAM:  Wt Readings from Last 3 Encounters:  04/19/19 225 lb (102.1 kg)  03/10/19 225 lb (102.1 kg)  07/27/18 224 lb (101.6 kg)   Temp Readings from Last 3 Encounters:  05/26/16 97.8 F (36.6 C) (Axillary)  04/18/15 97.9 F (36.6 C) (Oral)  07/07/13 97 F (36.1 C) (Oral)   BP Readings from Last 3 Encounters:  07/27/18 (!) 157/90  04/02/18 131/89  05/27/16 141/65   Pulse Readings from Last 3 Encounters:  07/27/18 63  05/27/16 97  08/30/15 67   Pain Assessment Pain Score: 0-No pain/10  Physical exam not performed in light of telephone consult visit format.   KPS = 90  100 - Normal; no complaints; no evidence of disease. 90   - Able to carry on normal activity; minor signs or symptoms of disease. 80   - Normal activity with effort; some signs or symptoms of disease. 66   - Cares for self; unable to carry on normal activity or to do active work. 60   - Requires occasional assistance, but is able to care for most of his personal needs. 50   - Requires considerable assistance and frequent medical care. 45   - Disabled; requires special care and assistance. 39   - Severely disabled; hospital admission is indicated although death not imminent. 67   - Very sick; hospital admission necessary; active supportive treatment necessary. 10   - Moribund; fatal processes progressing rapidly. 0     - Dead  Karnofsky DA, Abelmann WH, Craver LS and Burchenal [facility] [phone]) The use of the nitrogen mustards in the palliative treatment of carcinoma: with particular reference to bronchogenic carcinoma Cancer 1 634-56  LABORATORY DATA:  Lab Results  Component Value Date   WBC 6.2 11/21/2009   HGB 18.2 (H) 11/21/2009   HCT 51.3 11/21/2009   MCV 87.2 11/21/2009   PLT 212 11/21/2009   Lab Results   Component Value Date   NA 139 08/23/2015   K 3.8 08/23/2015   CL 106 08/23/2015   CO2 27 08/23/2015   Lab Results  Component Value Date   ALT 28 08/23/2015   AST 21 08/23/2015   ALKPHOS 75 08/23/2015   BILITOT 0.7 08/23/2015     RADIOGRAPHY: NM PET (AXUMIN) SKULL BASE TO MID THIGH  Result Date: 03/29/2019 CLINICAL DATA:  Prostate carcinoma with biochemical recurrence. EXAM: NUCLEAR MEDICINE PET SKULL BASE TO THIGH TECHNIQUE: 10.1 mCi F-18 Fluciclovine was injected intravenously. Full-ring PET imaging was performed from the skull base to thigh after the radiotracer. CT data was obtained and used for attenuation correction and anatomic localization. COMPARISON:  None. FINDINGS: NECK No radiotracer activity in neck lymph nodes. Incidental CT finding: None CHEST No radiotracer accumulation within mediastinal or hilar lymph nodes. No suspicious pulmonary nodules on the CT scan. Incidental CT finding: None ABDOMEN/PELVIS Prostate: No focal activity in the prostate bed. Lymph nodes: No abnormal radiotracer accumulation within pelvic or abdominal nodes. A small sub 10 mm LEFT external iliac lymph nodes have significant associated radiotracer. Liver: No evidence of liver metastasis Incidental CT finding: Atherosclerotic calcification of the aorta. Benign RIGHT renal cyst SKELETON There is diffuse moderate uptake throughout the marrow space the pelvis and spine. No focality. No CT correlation. Favor physiologic marrow accumulation of radiotracer IMPRESSION: 1. No evidence of local prostate cancer recurrence. 2. No evidence of nodal metastasis. 3. No evidence of soft tissue metastasis or skeletal metastasis. Electronically Signed   By: Suzy Bouchard M.D.   On: 03/29/2019 16:10      IMPRESSION/PLAN: This visit was conducted via Telephone to spare the patient unnecessary potential exposure in the healthcare setting during the current COVID-19 pandemic. 1. 70 y.o. gentleman with rising, detectable PSA  s/p RALP in 2003 for Stage pT2c, pN0 Gleason 3+4 prostate cancer with pretreatment PSA of 7.64. Today, we reviewed the findings and workup thus far.  We discussed the natural history of prostate cancer.  We reviewed the the implications of close margins on the risk of prostate cancer recurrence, particularly in light of rising, detectable postoperative PSA. We discussed radiation treatment directed to the prostatic fossa with regard to the logistics and delivery of external beam radiation treatment.  We also detailed the role of ADT in the treatment of recurrent prostate cancer and outlined the associated side effects that could be expected with this therapy. He was encouraged to ask questions that were answered to his stated satisfaction.  At the end of the conversation the patient is interested in moving forward with 7.5 weeks of salvage radiation therapy directed at the prostate fossa, concurrent with ADT. We will share our discussion with Dr. Lovena Neighbours and move forward with coordinating a follow up visit to start ADT, first available, prior to CT simulation, in preparation of beginning daily treatments in the near future.  Given current concerns for patient exposure during the COVID-19 pandemic, this encounter was conducted via telephone. The patient was notified in advance and was offered a MyChart meeting to allow for face to face communication but unfortunately reported that he did not have the appropriate resources/technology to support such a visit and instead preferred to proceed with telephone consult. The patient has given  verbal consent for this type of encounter. The time spent during this encounter was 45 minutes. The attendants for this meeting include Tyler Pita MD, Ashlyn Bruning PA-C, [place], and patient, CAMDYN BLOEMKER During the encounter, Tyler Pita MD, Ashlyn Bruning PA-C, and scribe, Wilburn Mylar were located at [facility].  Patient, TRAMINE HIGGS was located at home.    Nicholos Johns, PA-C    Tyler Pita, MD  [facility] Direct Dial: [phone]  Fax: [phone] [email or web address]  Skype  [email or web address]  This document serves as a record of services personally performed by Tyler Pita, MD and Freeman Caldron, PA-C. It was created on their behalf by Wilburn Mylar, a trained medical scribe. The creation of this record is based on the scribe's personal observations and the provider's statements to them. This document has been checked and approved by the attending provider.

## 2019-04-19 NOTE — Progress Notes (Signed)
See progress note under physician encounter. 

## 2019-04-20 ENCOUNTER — Telehealth: Payer: Self-pay | Admitting: *Deleted

## 2019-04-20 DIAGNOSIS — C61 Malignant neoplasm of prostate: Secondary | ICD-10-CM

## 2019-04-20 DIAGNOSIS — R9721 Rising PSA following treatment for malignant neoplasm of prostate: Secondary | ICD-10-CM

## 2019-04-20 HISTORY — DX: Malignant neoplasm of prostate: C61

## 2019-04-20 HISTORY — DX: Rising PSA following treatment for malignant neoplasm of prostate: R97.21

## 2019-04-20 NOTE — Telephone Encounter (Signed)
CALLED PATIENT TO INFORM OF ADT APPT. FOR 04-21-19 - ARRIVAL TIME- 12:30 PM @ DR. Jackson Latino OFFICE, SPOKE WITH PATIENT AND HE IS AWARE OF THIS APPT.

## 2019-04-21 ENCOUNTER — Telehealth: Payer: Self-pay | Admitting: *Deleted

## 2019-04-21 NOTE — Telephone Encounter (Signed)
CALLED PATIENT TO INFORM OF SIM APPT. FOR 04-29-19 - ARRIVAL TIME- 7:45 AM @ [place], SPOKE WITH PATIENT AND HE IS AWARE OF THIS APPT.

## 2019-04-25 ENCOUNTER — Ambulatory Visit
Admission: RE | Admit: 2019-04-25 | Discharge: 2019-04-25 | Disposition: A | Discharge status: Home / Self Care | Payer: Medicare HMO | Source: Ambulatory Visit | Attending: Neurology | Admitting: Neurology

## 2019-04-25 ENCOUNTER — Other Ambulatory Visit: Payer: Self-pay

## 2019-04-25 DIAGNOSIS — G3184 Mild cognitive impairment, so stated: Secondary | ICD-10-CM

## 2019-04-25 MED ORDER — GADOBENATE DIMEGLUMINE 529 MG/ML IV SOLN
20.0000 mL | Freq: Once | INTRAVENOUS | Status: AC | PRN
  Administered 2019-04-25: 16:00:00 20 mL via INTRAVENOUS

## 2019-04-26 ENCOUNTER — Telehealth: Payer: Self-pay

## 2019-04-26 NOTE — Telephone Encounter (Signed)
Pt called given MRI results, MRI brain looked fine, no evidence of tumor, stroke, or bleed. It showed age-related changes and mild hardening of the small blood vessels in the brain seen in patients with blood pressure, cholesterol issues. Very important to control those conditions. Proceed with Neurocognitive testing, does he have an appointment for this at the [facility] next week? PT is unsure he doesn't have his schedule with him

## 2019-04-28 ENCOUNTER — Telehealth: Payer: Self-pay | Admitting: *Deleted

## 2019-04-28 ENCOUNTER — Encounter: Payer: Self-pay | Admitting: Medical Oncology

## 2019-04-28 NOTE — Progress Notes (Signed)
  Radiation Oncology         [phone] ________________________________  Name: Curtis Bell MRN: [id number]  Date: 04/29/2019  DOB: 06/10/1950  SIMULATION AND TREATMENT PLANNING NOTE    ICD-10-CM   1. Prostate cancer [facility])  C61     DIAGNOSIS:  69 y.o. gentleman with rising, detectable PSA of 3.10 s/p RALP in 2003 for Stage pT2c, pN0 Gleason 3+4 prostate cancer with pretreatment PSA of 7.64.  NARRATIVE:  The patient was brought to the [facility].  Identity was confirmed.  All relevant records and images related to the planned course of therapy were reviewed.  The patient freely provided informed written consent to proceed with treatment after reviewing the details related to the planned course of therapy. The consent form was witnessed and verified by the simulation staff.  Then, the patient was set-up in a stable reproducible supine position for radiation therapy.  A vacuum lock pillow device was custom fabricated to position his legs in a reproducible immobilized position.  Then, I performed a urethrogram under sterile conditions to identify the prostatic bed.  CT images were obtained.  Surface markings were placed.  The CT images were loaded into the planning software.  Then the prostate bed target, pelvic lymph node target and avoidance structures including the rectum, bladder, bowel and hips were contoured.  Treatment planning then occurred.  The radiation prescription was entered and confirmed.  A total of one complex treatment devices were fabricated. I have requested : Intensity Modulated Radiotherapy (IMRT) is medically necessary for this case for the following reason:  Rectal sparing.Marland Kitchen  PLAN:  The patient will receive 45 Gy in 25 fractions of 1.8 Gy, followed by a boost to the prostate bed to a total dose of 68.4 Gy with 13 additional fractions of 1.8 Gy.   ________________________________  Sheral Apley Tammi Klippel, M.D.

## 2019-04-28 NOTE — Progress Notes (Signed)
Left message to introduce myself as the prostate nurse navigator and my role. I was unable to meet him 1/19 when he consulted with Dr. Tammi Klippel. I gave him my contact information and asked him to call me with questions or concerns. He is received [place] 1/21 and is scheduled for CT simulation 1/29.

## 2019-04-28 NOTE — Telephone Encounter (Signed)
CALLED PATIENT TO REMIND OF SIM APPT. FOR 04-29-19 - ARRIVAL TIME- 7:45 AM @ [place], SPOKE WITH PATIENT AND HE IS AWARE OF THIS APPT.

## 2019-04-29 ENCOUNTER — Other Ambulatory Visit: Payer: Self-pay

## 2019-04-29 ENCOUNTER — Ambulatory Visit
Admission: RE | Admit: 2019-04-29 | Discharge: 2019-04-29 | Disposition: A | Discharge status: Home / Self Care | Payer: Medicare HMO | Source: Ambulatory Visit | Attending: Radiation Oncology | Admitting: Radiation Oncology

## 2019-04-29 DIAGNOSIS — C61 Malignant neoplasm of prostate: Secondary | ICD-10-CM

## 2019-05-03 DIAGNOSIS — C61 Malignant neoplasm of prostate: Secondary | ICD-10-CM | POA: Diagnosis present

## 2019-05-10 ENCOUNTER — Encounter: Payer: Self-pay | Admitting: Medical Oncology

## 2019-05-10 ENCOUNTER — Ambulatory Visit
Admission: RE | Admit: 2019-05-10 | Discharge: 2019-05-10 | Disposition: A | Discharge status: Home / Self Care | Payer: Medicare HMO | Source: Ambulatory Visit | Attending: Radiation Oncology | Admitting: Radiation Oncology

## 2019-05-10 ENCOUNTER — Other Ambulatory Visit: Payer: Self-pay

## 2019-05-10 DIAGNOSIS — C61 Malignant neoplasm of prostate: Secondary | ICD-10-CM | POA: Diagnosis not present

## 2019-05-11 ENCOUNTER — Ambulatory Visit
Admission: RE | Admit: 2019-05-11 | Discharge: 2019-05-11 | Disposition: A | Discharge status: Home / Self Care | Payer: Medicare HMO | Source: Ambulatory Visit | Attending: Radiation Oncology | Admitting: Radiation Oncology

## 2019-05-11 ENCOUNTER — Other Ambulatory Visit: Payer: Self-pay

## 2019-05-11 DIAGNOSIS — C61 Malignant neoplasm of prostate: Secondary | ICD-10-CM | POA: Diagnosis not present

## 2019-05-12 ENCOUNTER — Ambulatory Visit
Admission: RE | Admit: 2019-05-12 | Discharge: 2019-05-12 | Disposition: A | Discharge status: Home / Self Care | Payer: Medicare HMO | Source: Ambulatory Visit | Attending: Radiation Oncology | Admitting: Radiation Oncology

## 2019-05-12 ENCOUNTER — Other Ambulatory Visit: Payer: Self-pay

## 2019-05-12 DIAGNOSIS — C61 Malignant neoplasm of prostate: Secondary | ICD-10-CM | POA: Diagnosis not present

## 2019-05-13 ENCOUNTER — Other Ambulatory Visit: Payer: Self-pay

## 2019-05-13 ENCOUNTER — Ambulatory Visit
Admission: RE | Admit: 2019-05-13 | Discharge: 2019-05-13 | Disposition: A | Discharge status: Home / Self Care | Payer: Medicare HMO | Source: Ambulatory Visit | Attending: Radiation Oncology | Admitting: Radiation Oncology

## 2019-05-13 DIAGNOSIS — C61 Malignant neoplasm of prostate: Secondary | ICD-10-CM | POA: Diagnosis not present

## 2019-05-16 ENCOUNTER — Other Ambulatory Visit: Payer: Self-pay

## 2019-05-16 ENCOUNTER — Ambulatory Visit
Admission: RE | Admit: 2019-05-16 | Discharge: 2019-05-16 | Disposition: A | Discharge status: Home / Self Care | Payer: Medicare HMO | Source: Ambulatory Visit | Attending: Radiation Oncology | Admitting: Radiation Oncology

## 2019-05-16 DIAGNOSIS — C61 Malignant neoplasm of prostate: Secondary | ICD-10-CM | POA: Diagnosis not present

## 2019-05-17 ENCOUNTER — Other Ambulatory Visit: Payer: Self-pay

## 2019-05-17 ENCOUNTER — Ambulatory Visit
Admission: RE | Admit: 2019-05-17 | Discharge: 2019-05-17 | Disposition: A | Discharge status: Home / Self Care | Payer: Medicare HMO | Source: Ambulatory Visit | Attending: Radiation Oncology | Admitting: Radiation Oncology

## 2019-05-17 DIAGNOSIS — C61 Malignant neoplasm of prostate: Secondary | ICD-10-CM | POA: Diagnosis not present

## 2019-05-18 ENCOUNTER — Other Ambulatory Visit: Payer: Self-pay

## 2019-05-18 ENCOUNTER — Ambulatory Visit
Admission: RE | Admit: 2019-05-18 | Discharge: 2019-05-18 | Disposition: A | Discharge status: Home / Self Care | Payer: Medicare HMO | Source: Ambulatory Visit | Attending: Radiation Oncology | Admitting: Radiation Oncology

## 2019-05-18 DIAGNOSIS — C61 Malignant neoplasm of prostate: Secondary | ICD-10-CM | POA: Diagnosis not present

## 2019-05-19 ENCOUNTER — Other Ambulatory Visit: Payer: Self-pay

## 2019-05-19 ENCOUNTER — Ambulatory Visit
Admission: RE | Admit: 2019-05-19 | Discharge: 2019-05-19 | Disposition: A | Discharge status: Home / Self Care | Payer: Medicare HMO | Source: Ambulatory Visit | Attending: Radiation Oncology | Admitting: Radiation Oncology

## 2019-05-19 DIAGNOSIS — C61 Malignant neoplasm of prostate: Secondary | ICD-10-CM | POA: Diagnosis not present

## 2019-05-20 ENCOUNTER — Other Ambulatory Visit: Payer: Self-pay

## 2019-05-20 ENCOUNTER — Ambulatory Visit
Admission: RE | Admit: 2019-05-20 | Discharge: 2019-05-20 | Disposition: A | Discharge status: Home / Self Care | Payer: Medicare HMO | Source: Ambulatory Visit | Attending: Radiation Oncology | Admitting: Radiation Oncology

## 2019-05-20 DIAGNOSIS — C61 Malignant neoplasm of prostate: Secondary | ICD-10-CM | POA: Diagnosis not present

## 2019-05-23 ENCOUNTER — Other Ambulatory Visit: Payer: Self-pay

## 2019-05-23 ENCOUNTER — Ambulatory Visit
Admission: RE | Admit: 2019-05-23 | Discharge: 2019-05-23 | Disposition: A | Discharge status: Home / Self Care | Payer: Medicare HMO | Source: Ambulatory Visit | Attending: Radiation Oncology | Admitting: Radiation Oncology

## 2019-05-23 DIAGNOSIS — C61 Malignant neoplasm of prostate: Secondary | ICD-10-CM | POA: Diagnosis not present

## 2019-05-24 ENCOUNTER — Other Ambulatory Visit: Payer: Self-pay

## 2019-05-24 ENCOUNTER — Ambulatory Visit
Admission: RE | Admit: 2019-05-24 | Discharge: 2019-05-24 | Disposition: A | Discharge status: Home / Self Care | Payer: Medicare HMO | Source: Ambulatory Visit | Attending: Radiation Oncology | Admitting: Radiation Oncology

## 2019-05-24 DIAGNOSIS — C61 Malignant neoplasm of prostate: Secondary | ICD-10-CM | POA: Diagnosis not present

## 2019-05-25 ENCOUNTER — Ambulatory Visit
Admission: RE | Admit: 2019-05-25 | Discharge: 2019-05-25 | Disposition: A | Discharge status: Home / Self Care | Payer: Medicare HMO | Source: Ambulatory Visit | Attending: Radiation Oncology | Admitting: Radiation Oncology

## 2019-05-25 ENCOUNTER — Other Ambulatory Visit: Payer: Self-pay

## 2019-05-25 DIAGNOSIS — C61 Malignant neoplasm of prostate: Secondary | ICD-10-CM | POA: Diagnosis not present

## 2019-05-26 ENCOUNTER — Ambulatory Visit
Admission: RE | Admit: 2019-05-26 | Discharge: 2019-05-26 | Disposition: A | Discharge status: Home / Self Care | Payer: Medicare HMO | Source: Ambulatory Visit | Attending: Radiation Oncology | Admitting: Radiation Oncology

## 2019-05-26 ENCOUNTER — Other Ambulatory Visit: Payer: Self-pay

## 2019-05-26 DIAGNOSIS — C61 Malignant neoplasm of prostate: Secondary | ICD-10-CM | POA: Diagnosis not present

## 2019-05-27 ENCOUNTER — Other Ambulatory Visit: Payer: Self-pay

## 2019-05-27 ENCOUNTER — Encounter: Payer: Self-pay | Admitting: Medical Oncology

## 2019-05-27 ENCOUNTER — Ambulatory Visit
Admission: RE | Admit: 2019-05-27 | Discharge: 2019-05-27 | Disposition: A | Discharge status: Home / Self Care | Payer: Medicare HMO | Source: Ambulatory Visit | Attending: Radiation Oncology | Admitting: Radiation Oncology

## 2019-05-27 DIAGNOSIS — C61 Malignant neoplasm of prostate: Secondary | ICD-10-CM | POA: Diagnosis not present

## 2019-05-30 ENCOUNTER — Other Ambulatory Visit: Payer: Self-pay

## 2019-05-30 ENCOUNTER — Ambulatory Visit
Admission: RE | Admit: 2019-05-30 | Discharge: 2019-05-30 | Disposition: A | Discharge status: Home / Self Care | Payer: Medicare HMO | Source: Ambulatory Visit | Attending: Radiation Oncology | Admitting: Radiation Oncology

## 2019-05-30 DIAGNOSIS — C61 Malignant neoplasm of prostate: Secondary | ICD-10-CM | POA: Diagnosis present

## 2019-05-31 ENCOUNTER — Other Ambulatory Visit: Payer: Self-pay

## 2019-05-31 ENCOUNTER — Ambulatory Visit
Admission: RE | Admit: 2019-05-31 | Discharge: 2019-05-31 | Disposition: A | Discharge status: Home / Self Care | Payer: Medicare HMO | Source: Ambulatory Visit | Attending: Radiation Oncology | Admitting: Radiation Oncology

## 2019-05-31 DIAGNOSIS — C61 Malignant neoplasm of prostate: Secondary | ICD-10-CM | POA: Diagnosis not present

## 2019-06-01 ENCOUNTER — Ambulatory Visit
Admission: RE | Admit: 2019-06-01 | Discharge: 2019-06-01 | Disposition: A | Discharge status: Home / Self Care | Payer: Medicare HMO | Source: Ambulatory Visit | Attending: Radiation Oncology | Admitting: Radiation Oncology

## 2019-06-01 ENCOUNTER — Other Ambulatory Visit: Payer: Self-pay

## 2019-06-01 DIAGNOSIS — C61 Malignant neoplasm of prostate: Secondary | ICD-10-CM | POA: Diagnosis not present

## 2019-06-02 ENCOUNTER — Other Ambulatory Visit: Payer: Self-pay

## 2019-06-02 ENCOUNTER — Ambulatory Visit
Admission: RE | Admit: 2019-06-02 | Discharge: 2019-06-02 | Disposition: A | Discharge status: Home / Self Care | Payer: Medicare HMO | Source: Ambulatory Visit | Attending: Radiation Oncology | Admitting: Radiation Oncology

## 2019-06-02 DIAGNOSIS — C61 Malignant neoplasm of prostate: Secondary | ICD-10-CM | POA: Diagnosis not present

## 2019-06-03 ENCOUNTER — Ambulatory Visit
Admission: RE | Admit: 2019-06-03 | Discharge: 2019-06-03 | Disposition: A | Discharge status: Home / Self Care | Payer: Medicare HMO | Source: Ambulatory Visit | Attending: Radiation Oncology | Admitting: Radiation Oncology

## 2019-06-03 ENCOUNTER — Encounter: Payer: Self-pay | Admitting: Psychology

## 2019-06-03 ENCOUNTER — Ambulatory Visit: Payer: Medicare HMO | Admitting: Psychology

## 2019-06-03 ENCOUNTER — Ambulatory Visit (INDEPENDENT_AMBULATORY_CARE_PROVIDER_SITE_OTHER): Payer: Medicare HMO | Admitting: Psychology

## 2019-06-03 ENCOUNTER — Other Ambulatory Visit: Payer: Self-pay

## 2019-06-03 DIAGNOSIS — R4189 Other symptoms and signs involving cognitive functions and awareness: Secondary | ICD-10-CM

## 2019-06-03 DIAGNOSIS — C61 Malignant neoplasm of prostate: Secondary | ICD-10-CM | POA: Diagnosis not present

## 2019-06-03 NOTE — Progress Notes (Signed)
   Psychometrician Note   Cognitive testing was administered to [employer] by Milana Kidney, B.S. (psychometrist) under the supervision of Dr. Christia Reading, Ph.D., licensed psychologist. Mr. Walton did not appear overtly distressed by the testing session per behavioral observation or responses across self-report questionnaires. Dr. Christia Reading, Ph.D. checked in with Mr. Hoying as needed to manage any distress related to testing procedures (if applicable). Rest breaks were offered.    The battery of tests administered was selected by Dr. Christia Reading, Ph.D. with consideration to Mr. Costas current level of functioning, the nature of his symptoms, emotional and behavioral responses during interview, level of literacy, observed level of motivation/effort, and the nature of the referral question. This battery was communicated to the psychometrist. Communication between Dr. Christia Reading, Ph.D. and the psychometrist was ongoing throughout the evaluation and Dr. Christia Reading, Ph.D. was immediately accessible at all times. Dr. Christia Reading, Ph.D. provided supervision to the psychometrist on the date of this service to the extent necessary to assure the quality of all services provided.    Duane Lope Prime will return within approximately 1-2 weeks for an interactive feedback session with Dr. Melvyn Novas at which time his test performances, clinical impressions, and treatment recommendations will be reviewed in detail. Mr. Lunn understands he can contact our office should he require our assistance before this time.  A total of 130 minutes of billable time were spent face-to-face with Mr. Laatsch by the psychometrist. This includes both test administration and scoring time. Billing for these services is reflected in the clinical report generated by Dr. Christia Reading, Ph.D..  This note reflects time spent with the psychometrician and does not include test scores or any clinical  interpretations made by Dr. Melvyn Novas. The full report will follow in a separate note.

## 2019-06-03 NOTE — Progress Notes (Signed)
NEUROPSYCHOLOGICAL EVALUATION [place] Department of Neurology  Reason for Referral:   Curtis Bell is a 69 y.o. left-handed Caucasian male referred by Ellouise Newer, M.D., to characterize his current cognitive functioning and assist with diagnostic clarity and treatment planning in the context of subjective cognitive decline.  Assessment and Plan:   Clinical Impression(s): Curtis Bell pattern of performance is suggestive of neuropsychological functioning generally within appropriate normative ranges given premorbid intellectual estimations. Mild weaknesses were exhibited across more complex attention (i.e., working memory), some aspects of executive functioning (e.g., verbal set shifting and response inhibition), and phonemic fluency. Performances were generally appropriate across domains of processing speed, basic attention, other aspects of executive functioning, receptive language, semantic fluency, confrontation naming, visuospatial functioning, and learning and memory. Curtis Bell denied difficulties completing instrumental activities of daily living (ADLs) independently and I do not feel that weaknesses arise to where a mild neurocognitive disorder would be warranted at the present time.  Specific to memory, Curtis Bell did exhibit a relative weakness learning novel information efficiently. However, he exhibited good learning curves and retained this knowledge after lengthy delays. Overall, memory performance combined with intact performances across other areas of cognitive functioning is not suggestive of Alzheimer's disease. The etiology of observed weaknesses and day-to-day difficulties is somewhat unclear. He did endorse only obtaining 6 hours of "broken" sleep per night, likely preventing him from obtaining higher levels of deeper, more restorative sleep. This, coupled with ongoing psychosocial stressors (e.g., concerns surrounding subjective memory  decline and ongoing prostate cancer treatment) could represent primary etiologies for his day-to-day difficulties. However, regarding the former, he described minimal sleep dysfunction on a related questionnaire and reported generally feeling rested and refreshed upon awakening. Mood-related distress was also denied across questionnaires.   Recommendations: A repeat neuropsychological evaluation in 24 months (or sooner if functional decline is noted) is recommended to assess the trajectory of future cognitive decline should it occur. This will also aid in future efforts towards improved diagnostic clarity.  Curtis Bell is encouraged to attend to lifestyle factors for brain health (e.g., regular physical exercise, good nutrition habits, regular participation in cognitively-stimulating activities, and general stress management techniques), which are likely to have benefits for both emotional adjustment and cognition. In fact, in addition to promoting good general health, regular exercise incorporating aerobic activities (e.g., brisk walking, jogging, cycling, etc.) has been demonstrated to be a very effective treatment for depression and stress, with similar efficacy rates to both antidepressant medication and psychotherapy. Optimal control of vascular risk factors (including safe cardiovascular exercise and adherence to dietary recommendations) is encouraged.  If interested, there are some activities which have therapeutic value and can be useful in keeping him cognitively stimulated. For suggestions, Curtis Bell is encouraged to go to the following website: https://www.[facility].org/get-to-know-barrow/centers-programs/neurorehabilitation-center/neuro-rehab-apps-and-games/ which has options, categorized by level of difficulty. It should be noted that these activities should not be viewed as a substitute for therapy.  When learning new information, he would benefit from information being broken up into small,  manageable pieces. He may also find it helpful to articulate the material in his own words and in a context to promote encoding at the onset of a new task. This material may need to be repeated multiple times to promote encoding.  To address problems with working memory and executive dysfunction, he may wish to consider:   -Avoiding external distractions when needing to concentrate   -Limiting exposure to fast paced environments with multiple sensory demands   -  Writing down complicated information and using checklists   -Attempting and completing one task at a time (i.e., no multi-tasking)   -Verbalizing aloud each step of a task to maintain focus   -Reducing the amount of information considered at one time  Review of Records:   Curtis Bell was seen by [facility] Marland KitchenEllouise Newer, M.D.) on 03/11/2019 for follow-up of worsening memory. Performance on a brief cognitive screening instrument [facility]) was 27/30 in April 2020. Since that time, he reported his belief that his memory had not changed. However, his wife tells him he that forgets things all the time. Examples included entering a room and forgetting his original intention, as well as needing to remind himself where he is going while driving. ADLs were described as intact. He was previously trialed on donepezil but had negative side effects and this medication was discontinued in favor of memantine. He denied headaches, dizziness, vision changes, focal numbness/tingling/weakness, and recent falls. Performance on a cognitive screening instrument (SLUMS) during the current appointment was 20/30. Ultimately, Curtis Bell was referred for a comprehensive neuropsychological evaluation to characterize his cognitive abilities and to assist with diagnostic clarity and treatment planning.  Curtis Bell was most recently seen by Radiation Oncology Tyler Pita, M.D.) on 04/29/2019 in the CT simulation planning suite as a part of ongoing prostate cancer  treatment planning. Dr. Tammi Klippel requested intensity modulated radiotherapy (IMRT), with the tentative plan including 45 Gy in 25 fractions of 1.8 Gy, followed by a boost to the prostate bed to a total dose of 68.4 Gy with 13 additional fractions of 1.8 Gy. Treatment commenced on 04/29/2019, with his most recent session occurring on 05/27/2019.   Head CT on 05/26/2016 in the context of falling off a ladder was negative. Brain MRI on 04/25/2019 revealed mild chronic microvascular changes. Cerebral volume was age-appropriate.   Past Medical History:  Diagnosis Date  . Biochemically recurrent malignant neoplasm of prostate 04/20/2019  . Chronic fatigue syndrome   . Chronic pansinusitis 04/13/2017  . Deviated nasal septum 06/04/2017  . Hypertension   . Laryngopharyngeal reflux (LPR) 01/24/2019  . Osteoporosis   . Other emphysema 08/31/2017  . Prostate cancer     Past Surgical History:  Procedure Laterality Date  . ANTERIOR CRUCIATE LIGAMENT REPAIR    . CATARACT EXTRACTION    . HERNIA REPAIR    . KNEE SURGERY    . PROSTATE SURGERY      Current Outpatient Medications:  .  acetaminophen (TYLENOL ARTHRITIS PAIN) 650 MG CR tablet, Per bottle as needed, Disp: , Rfl:  .  albuterol (VENTOLIN HFA) 108 (90 Base) MCG/ACT inhaler, Inhale into the lungs every 6 (six) hours as needed for wheezing or shortness of breath. Patient has run out of this medication, Disp: , Rfl:  .  amLODipine (NORVASC) 10 MG tablet, Take 0.5 tablets by mouth every morning. , Disp: , Rfl: 2 .  Ascorbic Acid (VITAMIN C) 1000 MG tablet, Take 1,000 mg by mouth daily. Plus Vitamin E, Disp: , Rfl:  .  aspirin 81 MG tablet, Take 81 mg by mouth daily.  , Disp: , Rfl:  .  Boswellia-Glucosamine-Vit D (OSTEO BI-FLEX ONE PER DAY PO), Take by mouth., Disp: , Rfl:  .  budesonide-formoterol (SYMBICORT) 160-4.5 MCG/ACT inhaler, Inhale 2 puffs into the lungs 2 (two) times daily., Disp: 1 Inhaler, Rfl: 3 .  carvedilol (COREG) 12.5 MG tablet, 12.5 mg.  twice daily, Disp: , Rfl: 3 .  cetirizine (ZYRTEC) 10 MG tablet, Take 10 mg  by mouth daily., Disp: , Rfl:  .  Cholecalciferol (VITAMIN D3) 1.25 MG (50000 UT) CAPS, TK 1 C PO 1 TIME A WK, Disp: , Rfl:  .  fluticasone (FLONASE) 50 MCG/ACT nasal spray, Place 2 sprays into both nostrils daily. (Patient not taking: Reported on 04/19/2019), Disp: 29.7 g, Rfl: 1 .  furosemide (LASIX) 40 MG tablet, Take 40 mg by mouth., Disp: , Rfl:  .  Ginger, Zingiber officinalis, (GINGER PO), Take by mouth., Disp: , Rfl:  .  ibuprofen (ADVIL,MOTRIN) 600 MG tablet, Take by mouth., Disp: , Rfl:  .  losartan (COZAAR) 50 MG tablet, Take 50 mg by mouth daily., Disp: , Rfl:  .  memantine (NAMENDA) 10 MG tablet, Take 1 tablet (10 mg total) by mouth 2 (two) times daily., Disp: 180 tablet, Rfl: 3 .  Multiple Vitamin (MULTIVITAMIN) tablet, Take 1 tablet by mouth daily., Disp: , Rfl:  .  Naproxen Sodium (ALEVE) 220 MG CAPS, Take 1 capsule by mouth 2 (two) times daily., Disp: , Rfl:  .  nitroGLYCERIN (NITROSTAT) 0.4 MG SL tablet, 1 under tongue every 5 mins x3 if needed for chest pain, Disp: , Rfl: 3 .  Probiotic Product (CULTURELLE PROBIOTICS PO), Take by mouth., Disp: , Rfl:  .  Respiratory Therapy Supplies (FLUTTER) DEVI, Use as directed. (Patient not taking: Reported on 04/19/2019), Disp: 1 each, Rfl: 0 .  tadalafil (CIALIS) 5 MG tablet, Take 5 mg by mouth as needed. , Disp: , Rfl:  .  tamsulosin (FLOMAX) 0.4 MG CAPS capsule, Take 0.4 mg by mouth daily. Not taking. Ran out of the medication., Disp: , Rfl:  .  Turmeric (QC TUMERIC COMPLEX PO), Take by mouth., Disp: , Rfl:   Clinical Interview:   Cognitive Symptoms: Decreased short-term memory: Endorsed. Mr. Leazer reported instances where he will temporarily lose track of where he is headed while driving, noting that this information generally comes back to him quickly. He also reported trouble remembering the names of some extended family members and their children, as well as  misplacing/losing things around the home. These deficits were said to be present for at least the past 2-3 years (perhaps longer) and have gradually declined over time.  Decreased long-term memory: Endorsed. Specifically, he reported that his wife will often tell stories of events or activities in the past that he and his family participated in that which he cannot remember.  Decreased attention/concentration: Denied. Reduced processing speed: Endorsed. Specifically, he reported it taking longer for him to focus his attention.  Difficulties with executive functions: Denied. Personality changes were also denied.  Difficulties with emotion regulation: Denied. Difficulties with receptive language: Denied under the assumption that he can hear the source of the sound appropriately. Difficulties with word finding: Endorsed. In addition to word finding trouble, he reported increasing difficulties with pronunciation, mild stuttering behaviors at times, and worsening spelling/reading abilities.  Decreased visuoperceptual ability: Denied.  Difficulties completing ADLs: Denied.  Additional Medical History: History of traumatic brain injury/concussion: Endorsed. He reported experiencing several likely concussive events throughout his youth and adulthood, including one injury sustained while in the [employer]. He denied any instances where he experienced a loss in consciousness, as well as those which resulted in persisting post-concussion symptoms.  History of stroke: Denied. History of seizure activity: Endorsed. He reported his mother telling him that he experienced some seizure activity as a baby. However, seizure activity as an adult was denied.  History of known exposure to toxins: Denied. Symptoms of chronic pain: Endorsed.  He reported bilateral knee pain which limits his mobility, as well as lower back pain when he stands for extended periods of time.  Experience of frequent headaches/migraines: Denied.  Symptoms were said to occur infrequently.  Frequent instances of dizziness/vertigo: Denied.  Sensory changes: He wears glasses with positive effect. He has been given hearing aids by the [place], but has had trouble operating them and is waiting for an appointment with them so that they can instruct him in how to use these devices. Other sensory changes/difficulties (i.e., taste and smell) were denied. Balance/coordination difficulties: Largely denied. However, mild stability issues can arise when knee pain is exacerbated.  Other motor difficulties: Denied.  Sleep History: Estimated hours obtained each night: 6 hours. Difficulties falling asleep: Denied. Difficulties staying asleep: Endorsed. He reported difficulties staying asleep throughout the night and often uses the restroom after being awoken. He noted occasional difficulties falling back asleep after he has woken up, describing his sleep as "broken" overall. Feels rested and refreshed upon awakening: Endorsed.  History of snoring: Endorsed. History of waking up gasping for air: Denied. Witnessed breath cessation while asleep: Denied.  History of vivid dreaming: Endorsed. He reported rare instances of dreaming that he is at risk of being captured by an enemy individual. He noted that his wife hugged his back one time while this was happening and he accidentally kicked her while asleep.  Excessive movement while asleep: Denied. Instances of acting out his dreams: Denied outside of very rare instances as described above.  Psychiatric/Behavioral Health History: Depression: Denied. He described his current mood as "pretty good" and denied previous mental health concerns. Current or remote suicidal ideation, intent, or plan was also denied.  Anxiety: Denied. Mania: Denied. Trauma History: Denied. Visual/auditory hallucinations: Denied. Delusional thoughts: Denied.  Tobacco: Denied. Alcohol: He denied current alcohol consumption, as well as  a history of problematic alcohol abuse or dependence.  Recreational drugs: Denied. Caffeine: Rare tea consumption. He reported active attempts to cut back on caffeine consumption.   Family History: Problem Relation Age of Onset  . Memory loss Mother        Marena Chancy if ever formally diagnosed with dementia  . Prostate cancer Father        lived to be 79 years old  . Lung disease Father   . Cataracts Father   . Amblyopia Neg Hx   . Blindness Neg Hx   . Glaucoma Neg Hx   . Macular degeneration Neg Hx   . Retinal detachment Neg Hx   . Strabismus Neg Hx   . Retinitis pigmentosa Neg Hx   . Breast cancer Neg Hx   . Colon cancer Neg Hx   . Pancreatic cancer Neg Hx    This information was confirmed by Curtis Bell.  Academic/Vocational History: Highest level of educational attainment: 12 years. 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 He alluded to reading as possibly representing a relative weakness.  History of developmental delay: Denied. History of grade repetition: Denied. Enrollment in special education courses: Denied. History of LD/ADHD: Denied.  Employment: Retired. He served in the [place] for a little over two years. 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   Evaluation Results:   Behavioral Observations: Curtis Bell was unaccompanied, arrived to his appointment on time, and was  appropriately dressed and groomed. Observed gait and station were within normal limits. Gross motor functioning appeared intact upon informal observation and no abnormal movements (e.g., tremors) were noted. His affect was generally relaxed and positive, but did range appropriately given the subject being discussed during the clinical interview or the task at hand during testing procedures.  Spontaneous speech was fluent and word finding difficulties were not observed during the clinical interview or testing procedures. Thought processes were coherent, organized, and normal in content. Insight into his cognitive difficulties appeared appropriate. During testing, sustained attention was appropriate. Task engagement was adequate and he persisted when challenged. Overall, Curtis Bell was cooperative with the clinical interview and subsequent testing procedures.   Adequacy of Effort: The validity of neuropsychological testing is limited by the extent to which the individual being tested may be assumed to have exerted adequate effort during testing. Curtis Bell expressed his intention to perform to the best of his abilities and exhibited adequate task engagement and persistence. Scores across stand-alone and embedded performance validity measures were within expectation. As such, the results of the current evaluation are believed to be a valid representation of Curtis Bell current cognitive functioning.  Test Results: Curtis Bell was fully oriented at the time of the current evaluation.  Intellectual abilities based upon educational and vocational attainment were estimated to be in the average range. Premorbid abilities were estimated to be within the well below average range based upon a single-word reading test.   Processing speed was below average to average. Basic attention was average. More complex attention (e.g., working memory) was well below average. Executive functioning was variable, ranging from the well below average to well above average normative ranges. A weakness was exhibited across a semantic fluency set shifting task, while a strength was exhibited across a task assessing problem solving/hypothesis testing.   Assessed receptive language abilities were average. Likewise, Curtis Bell did not exhibit any difficulties comprehending task instructions and answered all questions asked  of him appropriately. Assessed expressive language (e.g., verbal fluency and confrontation naming) was variable. Phonemic fluency was exceptionally low, semantic fluency was above average, and confrontation naming was below average.     Assessed visuospatial/visuoconstructional abilities were within normal limits.    Learning (i.e., encoding) of novel verbal and visual information was below average to average. Spontaneous delayed recall (i.e., retrieval) of previously learned information was generally average to above average. Retention rates were 100% across a story learning task, 71-100% across a list learning task, and 100% across a shape learning task. Performance across recognition tasks was largely appropriate, suggesting evidence for information consolidation.   Results of emotional screening instruments suggested that recent symptoms of generalized anxiety were in the minimal range, while symptoms of depression were within normal limits. A screening instrument assessing recent sleep quality suggested the presence of minimal sleep dysfunction.  Tables of Scores:   Note: This summary of test scores accompanies the interpretive report and should not be considered in isolation without reference to the appropriate sections in the text. Descriptors are based on appropriate normative data and may be adjusted based on clinical judgment. The terms "impaired" and "within normal limits (WNL)" are used when a more specific level of functioning cannot be determined.       Effort Testing:   DESCRIPTOR       ACS Word Choice: --- --- Within Expectation  CVLT-III Forced Choice Recognition: --- --- Within Expectation  BVMT-R Retention Percentage: --- --- Within Expectation  Orientation:      Raw Score Percentile   NAB Orientation, Form 1 29/29 --- ---       Intellectual Functioning:           Standard Score Percentile   Test of Premorbid Functioning: 74 4 Well Below Average       Memory:           Wechsler Memory Scale (WMS-IV):                       Raw Score (Scaled Score) Percentile     Logical Memory I 20/53 (6) 9 Below Average    Logical Memory II 19/39 (11) 63 Average    Logical Memory Recognition 18/23 26-50 Average       [place] Verbal Learning Test (CVLT-III) Brief Form: Raw Score (Scaled/Standard Score) Percentile     Total Trials 1-4 23/36 (90) 25 Average    Short-Delay Free Recall 6/9 (8) 25 Average    Long-Delay Free Recall 5/9 (7) 16 Below Average    Long-Delay Cued Recall 7/9 (11) 63 Average      Recognition Hits 9/9 (13) 84 Above Average      False Positive Errors 2 (7) 16 Below Average       Brief Visuospatial Memory Test (BVMT-R), Form 1: Raw Score (T Score) Percentile     Total Trials 1-3 14/36 (37) 9 Below Average    Delayed Recall 10/12 (59) 82 Above Average    Recognition Discrimination Index 4 11-16 Below Average      Recognition Hits 6/6 >16 Within Normal Limits      False Positive Errors 2 <1 Exceptionally Low        Attention/Executive Function:          Trail Making Test (TMT): Raw Score (T Score) Percentile     Part A 37 secs.,  0 errors (50) 50 Average    Part B 133 secs.,  0 errors (42) 21 Below Average        Scaled Score Percentile   WAIS-IV Coding: 8 25 Average       NAB Attention Module, Form 1: T Score Percentile     Digits Forward 45 31 Average    Digits Backwards 32 4 Well Below Average       D-KEFS Color-Word Interference Test: Raw Score (Scaled Score) Percentile     Color Naming 40 secs. (7) 16 Below Average    Word Reading 27 secs. (9) 37 Average    Inhibition 100 secs. (6) 9 Below Average      Total Errors 6 errors (7) 16 Below Average    Inhibition/Switching 109 secs. (6) 9 Below Average      Total Errors 8 errors (5) 5 Well Below Average       D-KEFS Verbal Fluency Test: Raw Score (Scaled Score) Percentile     Letter Total Correct 13 (3) 1 Exceptionally Low    Category Total Correct 38 (12) 75 Above Average     Category Switching Total Correct 9 (6) 9 Below Average    Category Switching Accuracy 6 (5) 5 Well Below Average      Total Set Loss Errors 2 (10) 50 Average      Total Repetition Errors 5 (8) 25 Average       D-KEFS 20 Questions Test: Scaled Score Percentile     Total Weighted Achievement Score 15 95 Well Above Average    Initial Abstraction Score 13 84 Above  Average       Language:          NAB Language Module, Form 1: T Score Percentile     Auditory Comprehension 44 27 Average    Naming 27/31 (37) 9 Below Average       Visuospatial/Visuoconstruction:      Raw Score Percentile   Clock Drawing: 10/10 --- Within Normal Limits       NAB Spatial Module, Form 1: T Score Percentile     Figure Drawing Copy 67 96 Well Above Average        Scaled Score Percentile   WAIS-IV Matrix Reasoning: 6 9 Below Average       Mood and Personality:      Raw Score Percentile   Geriatric Depression Scale: 4 --- Within Normal Limits  Geriatric Anxiety Scale: 7 --- Minimal    Somatic 5 --- Minimal    Cognitive 1 --- Minimal    Affective 1 --- Minimal       Additional Questionnaires:      Raw Score Percentile   PROMIS Sleep Disturbance Questionnaire: 16 --- None to Slight   Informed Consent and Coding/Compliance:   Curtis Bell was provided with a verbal description of the nature and purpose of the present neuropsychological evaluation. Also reviewed were the foreseeable risks and/or discomforts and benefits of the procedure, limits of confidentiality, and mandatory reporting requirements of this provider. The patient was given the opportunity to ask questions and receive answers about the evaluation. Oral consent to participate was provided by the patient.   This evaluation was conducted by Christia Reading, Ph.D., licensed clinical neuropsychologist. Curtis Bell completed a comprehensive clinical interview with Dr. Melvyn Novas, billed as one unit [phone], and 130 minutes of cognitive testing and scoring,  billed as one unit [phone] and three additional units [id number]. Psychometrist Milana Kidney, B.S., assisted Dr. Melvyn Novas with test administration and scoring procedures. As a separate and discrete service, Dr. Melvyn Novas spent a total of 180 minutes in interpretation and report writing billed as one unit [phone] and two units [id number].

## 2019-06-06 ENCOUNTER — Other Ambulatory Visit: Payer: Self-pay

## 2019-06-06 ENCOUNTER — Ambulatory Visit
Admission: RE | Admit: 2019-06-06 | Discharge: 2019-06-06 | Disposition: A | Discharge status: Home / Self Care | Payer: Medicare HMO | Source: Ambulatory Visit | Attending: Radiation Oncology | Admitting: Radiation Oncology

## 2019-06-06 DIAGNOSIS — C61 Malignant neoplasm of prostate: Secondary | ICD-10-CM | POA: Diagnosis not present

## 2019-06-07 ENCOUNTER — Other Ambulatory Visit: Payer: Self-pay

## 2019-06-07 ENCOUNTER — Ambulatory Visit
Admission: RE | Admit: 2019-06-07 | Discharge: 2019-06-07 | Disposition: A | Discharge status: Home / Self Care | Payer: Medicare HMO | Source: Ambulatory Visit | Attending: Radiation Oncology | Admitting: Radiation Oncology

## 2019-06-07 DIAGNOSIS — C61 Malignant neoplasm of prostate: Secondary | ICD-10-CM | POA: Diagnosis not present

## 2019-06-08 ENCOUNTER — Other Ambulatory Visit: Payer: Self-pay

## 2019-06-08 ENCOUNTER — Ambulatory Visit
Admission: RE | Admit: 2019-06-08 | Discharge: 2019-06-08 | Disposition: A | Discharge status: Home / Self Care | Payer: Medicare HMO | Source: Ambulatory Visit | Attending: Radiation Oncology | Admitting: Radiation Oncology

## 2019-06-08 DIAGNOSIS — C61 Malignant neoplasm of prostate: Secondary | ICD-10-CM | POA: Diagnosis not present

## 2019-06-09 ENCOUNTER — Other Ambulatory Visit: Payer: Self-pay

## 2019-06-09 ENCOUNTER — Ambulatory Visit
Admission: RE | Admit: 2019-06-09 | Discharge: 2019-06-09 | Disposition: A | Discharge status: Home / Self Care | Payer: Medicare HMO | Source: Ambulatory Visit | Attending: Radiation Oncology | Admitting: Radiation Oncology

## 2019-06-09 DIAGNOSIS — C61 Malignant neoplasm of prostate: Secondary | ICD-10-CM | POA: Diagnosis not present

## 2019-06-10 ENCOUNTER — Other Ambulatory Visit: Payer: Self-pay

## 2019-06-10 ENCOUNTER — Ambulatory Visit
Admission: RE | Admit: 2019-06-10 | Discharge: 2019-06-10 | Disposition: A | Discharge status: Home / Self Care | Payer: Medicare HMO | Source: Ambulatory Visit | Attending: Radiation Oncology | Admitting: Radiation Oncology

## 2019-06-10 DIAGNOSIS — C61 Malignant neoplasm of prostate: Secondary | ICD-10-CM | POA: Diagnosis not present

## 2019-06-13 ENCOUNTER — Other Ambulatory Visit: Payer: Self-pay

## 2019-06-13 ENCOUNTER — Ambulatory Visit
Admission: RE | Admit: 2019-06-13 | Discharge: 2019-06-13 | Disposition: A | Discharge status: Home / Self Care | Payer: Medicare HMO | Source: Ambulatory Visit | Attending: Radiation Oncology | Admitting: Radiation Oncology

## 2019-06-13 DIAGNOSIS — C61 Malignant neoplasm of prostate: Secondary | ICD-10-CM | POA: Diagnosis not present

## 2019-06-14 ENCOUNTER — Ambulatory Visit
Admission: RE | Admit: 2019-06-14 | Discharge: 2019-06-14 | Disposition: A | Discharge status: Home / Self Care | Payer: Medicare HMO | Source: Ambulatory Visit | Attending: Radiation Oncology | Admitting: Radiation Oncology

## 2019-06-14 ENCOUNTER — Other Ambulatory Visit: Payer: Self-pay

## 2019-06-14 DIAGNOSIS — C61 Malignant neoplasm of prostate: Secondary | ICD-10-CM | POA: Diagnosis not present

## 2019-06-15 ENCOUNTER — Ambulatory Visit (INDEPENDENT_AMBULATORY_CARE_PROVIDER_SITE_OTHER): Payer: Medicare HMO | Admitting: Psychology

## 2019-06-15 ENCOUNTER — Encounter: Payer: Self-pay | Admitting: Psychology

## 2019-06-15 ENCOUNTER — Other Ambulatory Visit: Payer: Self-pay

## 2019-06-15 ENCOUNTER — Ambulatory Visit
Admission: RE | Admit: 2019-06-15 | Discharge: 2019-06-15 | Disposition: A | Discharge status: Home / Self Care | Payer: Medicare HMO | Source: Ambulatory Visit | Attending: Radiation Oncology | Admitting: Radiation Oncology

## 2019-06-15 DIAGNOSIS — C61 Malignant neoplasm of prostate: Secondary | ICD-10-CM | POA: Diagnosis not present

## 2019-06-15 DIAGNOSIS — R4189 Other symptoms and signs involving cognitive functions and awareness: Secondary | ICD-10-CM

## 2019-06-15 NOTE — Progress Notes (Signed)
   Neuropsychology Feedback Session [name]. [place] Department of Neurology  Reason for Referral:   Curtis Bell Herbinis a 69 y.o. left-handed Caucasian male referred by Ellouise Newer, M.D.,to characterize hiscurrent cognitive functioning and assist with diagnostic clarity and treatment planning in the context of subjective cognitive decline.  Feedback:   Curtis Bell completed a comprehensive neuropsychological evaluation on 06/03/2019. Please refer to that encounter for the full report and recommendations. Briefly, results suggested neuropsychological functioning generally within appropriate normative ranges given premorbid intellectual estimations. Mild weaknesses were exhibited across more complex attention (i.e., working memory), some aspects of executive functioning (e.g., verbal set shifting and response inhibition), and phonemic fluency. Specific to memory, Curtis Bell did exhibit a relative weakness learning novel information efficiently. However, he exhibited good learning curves and retained this knowledge after lengthy delays. Overall, memory performance combined with intact performances across other areas of cognitive functioning is not suggestive of Alzheimer's disease presently. The etiology of observed weaknesses and day-to-day difficulties is somewhat unclear. He did endorse only obtaining 6 hours of "broken" sleep per night, likely preventing him from obtaining higher levels of deeper, more restorative sleep. This, coupled with ongoing psychosocial stressors (e.g., concerns surrounding subjective memory decline and ongoing prostate cancer treatment) could represent primary etiologies for his day-to-day difficulties. Mood-related distress was also denied across questionnaires.   Curtis Bell was unaccompanied during the current telephone call. Content of the current session focused on the results of his neuropsychological evaluation. Curtis Bell was given the opportunity  to ask questions and his questions were answered. He was encouraged to reach out should additional questions arise. A copy of his report was mailed at the conclusion of the visit.      Less than 16 minutes were spent conducting the current feedback session with Curtis Bell.

## 2019-06-16 ENCOUNTER — Ambulatory Visit
Admission: RE | Admit: 2019-06-16 | Discharge: 2019-06-16 | Disposition: A | Discharge status: Home / Self Care | Payer: Medicare HMO | Source: Ambulatory Visit | Attending: Radiation Oncology | Admitting: Radiation Oncology

## 2019-06-16 ENCOUNTER — Other Ambulatory Visit: Payer: Self-pay

## 2019-06-16 DIAGNOSIS — C61 Malignant neoplasm of prostate: Secondary | ICD-10-CM | POA: Diagnosis not present

## 2019-06-17 ENCOUNTER — Other Ambulatory Visit: Payer: Self-pay

## 2019-06-17 ENCOUNTER — Ambulatory Visit
Admission: RE | Admit: 2019-06-17 | Discharge: 2019-06-17 | Disposition: A | Discharge status: Home / Self Care | Payer: Medicare HMO | Source: Ambulatory Visit | Attending: Radiation Oncology | Admitting: Radiation Oncology

## 2019-06-17 DIAGNOSIS — C61 Malignant neoplasm of prostate: Secondary | ICD-10-CM | POA: Diagnosis not present

## 2019-06-20 ENCOUNTER — Other Ambulatory Visit: Payer: Self-pay

## 2019-06-20 ENCOUNTER — Ambulatory Visit
Admission: RE | Admit: 2019-06-20 | Discharge: 2019-06-20 | Disposition: A | Discharge status: Home / Self Care | Payer: Medicare HMO | Source: Ambulatory Visit | Attending: Radiation Oncology | Admitting: Radiation Oncology

## 2019-06-20 DIAGNOSIS — C61 Malignant neoplasm of prostate: Secondary | ICD-10-CM | POA: Diagnosis not present

## 2019-06-21 ENCOUNTER — Other Ambulatory Visit: Payer: Self-pay

## 2019-06-21 ENCOUNTER — Ambulatory Visit
Admission: RE | Admit: 2019-06-21 | Discharge: 2019-06-21 | Disposition: A | Discharge status: Home / Self Care | Payer: Medicare HMO | Source: Ambulatory Visit | Attending: Radiation Oncology | Admitting: Radiation Oncology

## 2019-06-21 DIAGNOSIS — C61 Malignant neoplasm of prostate: Secondary | ICD-10-CM | POA: Diagnosis not present

## 2019-06-22 ENCOUNTER — Other Ambulatory Visit: Payer: Self-pay

## 2019-06-22 ENCOUNTER — Ambulatory Visit
Admission: RE | Admit: 2019-06-22 | Discharge: 2019-06-22 | Disposition: A | Discharge status: Home / Self Care | Payer: Medicare HMO | Source: Ambulatory Visit | Attending: Radiation Oncology | Admitting: Radiation Oncology

## 2019-06-22 DIAGNOSIS — C61 Malignant neoplasm of prostate: Secondary | ICD-10-CM | POA: Diagnosis not present

## 2019-06-23 ENCOUNTER — Other Ambulatory Visit: Payer: Self-pay

## 2019-06-23 ENCOUNTER — Ambulatory Visit
Admission: RE | Admit: 2019-06-23 | Discharge: 2019-06-23 | Disposition: A | Discharge status: Home / Self Care | Payer: Medicare HMO | Source: Ambulatory Visit | Attending: Radiation Oncology | Admitting: Radiation Oncology

## 2019-06-23 DIAGNOSIS — C61 Malignant neoplasm of prostate: Secondary | ICD-10-CM | POA: Diagnosis not present

## 2019-06-24 ENCOUNTER — Ambulatory Visit
Admission: RE | Admit: 2019-06-24 | Discharge: 2019-06-24 | Disposition: A | Discharge status: Home / Self Care | Payer: Medicare HMO | Source: Ambulatory Visit | Attending: Radiation Oncology | Admitting: Radiation Oncology

## 2019-06-24 ENCOUNTER — Other Ambulatory Visit: Payer: Self-pay

## 2019-06-24 DIAGNOSIS — C61 Malignant neoplasm of prostate: Secondary | ICD-10-CM | POA: Diagnosis not present

## 2019-06-27 ENCOUNTER — Ambulatory Visit
Admission: RE | Admit: 2019-06-27 | Discharge: 2019-06-27 | Disposition: A | Discharge status: Home / Self Care | Payer: Medicare HMO | Source: Ambulatory Visit | Attending: Radiation Oncology | Admitting: Radiation Oncology

## 2019-06-27 ENCOUNTER — Other Ambulatory Visit: Payer: Self-pay

## 2019-06-27 DIAGNOSIS — C61 Malignant neoplasm of prostate: Secondary | ICD-10-CM | POA: Diagnosis not present

## 2019-06-28 ENCOUNTER — Ambulatory Visit
Admission: RE | Admit: 2019-06-28 | Discharge: 2019-06-28 | Disposition: A | Discharge status: Home / Self Care | Payer: Medicare HMO | Source: Ambulatory Visit | Attending: Radiation Oncology | Admitting: Radiation Oncology

## 2019-06-28 DIAGNOSIS — C61 Malignant neoplasm of prostate: Secondary | ICD-10-CM | POA: Diagnosis not present

## 2019-06-29 ENCOUNTER — Other Ambulatory Visit: Payer: Self-pay

## 2019-06-29 ENCOUNTER — Ambulatory Visit
Admission: RE | Admit: 2019-06-29 | Discharge: 2019-06-29 | Disposition: A | Discharge status: Home / Self Care | Payer: Medicare HMO | Source: Ambulatory Visit | Attending: Radiation Oncology | Admitting: Radiation Oncology

## 2019-06-29 DIAGNOSIS — C61 Malignant neoplasm of prostate: Secondary | ICD-10-CM | POA: Diagnosis not present

## 2019-06-30 ENCOUNTER — Encounter: Payer: Self-pay | Admitting: Urology

## 2019-06-30 ENCOUNTER — Encounter: Payer: Self-pay | Admitting: Medical Oncology

## 2019-06-30 ENCOUNTER — Ambulatory Visit
Admission: RE | Admit: 2019-06-30 | Discharge: 2019-06-30 | Disposition: A | Discharge status: Home / Self Care | Payer: Medicare HMO | Source: Ambulatory Visit | Attending: Radiation Oncology | Admitting: Radiation Oncology

## 2019-06-30 ENCOUNTER — Encounter: Payer: Self-pay | Admitting: Radiation Oncology

## 2019-06-30 ENCOUNTER — Other Ambulatory Visit: Payer: Self-pay

## 2019-06-30 DIAGNOSIS — C61 Malignant neoplasm of prostate: Secondary | ICD-10-CM | POA: Insufficient documentation

## 2019-07-28 NOTE — Progress Notes (Incomplete)
  Radiation Oncology         (336) [phone] ________________________________  Name: Curtis Bell MRN: [id number]  Date: 06/30/2019  DOB: 30-Nov-1950  End of Treatment Note  Diagnosis:   69 y.o. gentleman with rising, detectable PSA of 3.10 s/p RALP in 2003 for Stage pT2c, pN0 Gleason 3+4 prostate cancer with pretreatment PSA of 7.64.     Indication for treatment:  Curative, Definitive Radiotherapy       Radiation treatment dates:   05/10/2019 - 06/30/2019  Site/dose:  1. The prostate fossa and pelvic lymph nodes were initially treated to 45 Gy in 25 fractions of 1.8 Gy  2. The prostate fossa only was boosted to 68.4 Gy with 13 additional fractions of 1.8 Gy   Beams/energy:  1. The prostate fossa  and pelvic lymph nodes were initially treated using VMAT intensity modulated radiotherapy delivering 6 megavolt photons. Image guidance was performed with CB-CT studies prior to each fraction. He was immobilized with a body fix lower extremity mold.  2. The prostate fossa only was boosted using VMAT intensity modulated radiotherapy delivering 6 megavolt photons. Image guidance was performed with CB-CT studies prior to each fraction. He was immobilized with a body fix lower extremity mold.  Narrative: The patient tolerated radiation treatment relatively well.   The patient experienced some minor urinary irritation and modest fatigue.  ***  Plan: The patient has completed radiation treatment. He will return to radiation oncology clinic for routine followup in one month. I advised him to call or return sooner if he has any questions or concerns related to his recovery or treatment. ________________________________  Sheral Apley. Tammi Klippel, M.D.   This document serves as a record of services personally performed by Tyler Pita, MD. It was created on his behalf by Wilburn Mylar, a trained medical scribe. The creation of this record is based on the scribe's personal observations and the provider's  statements to them. This document has been checked and approved by the attending provider.

## 2019-08-02 ENCOUNTER — Telehealth: Payer: Self-pay

## 2019-08-02 NOTE — Progress Notes (Signed)
One month follow up from prostate treatment denies any hematuria dysuria denies any pain urgency or frequency however has some leakage from time to time. Feels he always empty his bladder and his urine stream is strong. Follow appointment has not been scheduled with urologist will follow up with [place] when he has appointment.

## 2019-08-03 ENCOUNTER — Other Ambulatory Visit: Payer: Self-pay

## 2019-08-03 ENCOUNTER — Ambulatory Visit
Admission: RE | Admit: 2019-08-03 | Discharge: 2019-08-03 | Disposition: A | Discharge status: Home / Self Care | Payer: Medicare HMO | Source: Ambulatory Visit | Attending: Urology | Admitting: Urology

## 2019-08-03 DIAGNOSIS — C61 Malignant neoplasm of prostate: Secondary | ICD-10-CM

## 2019-08-03 NOTE — Progress Notes (Signed)
Radiation Oncology         (336) [phone] ________________________________  Name: [name] MRN: [id number]  Date: 08/03/2019  DOB: 05/05/1950  Post Treatment Note  CC: Rogers Blocker, MD  Rogers Blocker, MD  Diagnosis:   69 y.o. gentleman with rising, detectable PSA of 3.10 s/p RALP in 2003 for Stage pT2c, pN0 Gleason 3+4 prostate cancer with pretreatment PSA of 7.64.  Interval Since Last Radiation:  5 weeks  05/10/2019 - 06/30/2019: concurrent with ADT 1. The prostate fossa and pelvic lymph nodes were initially treated to 45 Gy in 25 fractions of 1.8 Gy  2. The prostate fossa only was boosted to 68.4 Gy with 13 additional fractions of 1.8 Gy    Narrative:  I spoke with the patient to conduct his routine scheduled 1 month follow up visit via telephone to spare the patient unnecessary potential exposure in the healthcare setting during the current COVID-19 pandemic.  The patient was notified in advance and gave permission to proceed with this visit format. He tolerated radiation treatment relatively well with only minor urinary irritation and modest fatigue.                              On review of systems, the patient states that he is doing very well in general.  His LUTS are gradually improving and he is almost back to his baseline at this point.  He denies N/V/D. He has a good appetite and is maintaining his weight.  He has some moderate fatigue associated with the XRT and ADT as well as hot flashes.  He is taking Welbutrin to help manage the hot flashes but reports that this provides only minimal improvement. He is drinking ice water throughout the night to keep himself cool with the hot flashes so he feels that the nocturia 3x/night is associated with the fluid intake. His current IPSS is 10 and manageable. He specifically denies dysuria, straining to void or incontinence.  Overall, he is quite pleased with his progress to date.  ALLERGIES:  has No Known Allergies.  Meds: Current  Outpatient Medications  Medication Sig Dispense Refill  . acetaminophen (TYLENOL ARTHRITIS PAIN) 650 MG CR tablet Per bottle as needed    . albuterol (VENTOLIN HFA) 108 (90 Base) MCG/ACT inhaler Inhale into the lungs every 6 (six) hours as needed for wheezing or shortness of breath. Patient has run out of this medication    . amLODipine (NORVASC) 10 MG tablet Take 0.5 tablets by mouth every morning.   2  . Ascorbic Acid (VITAMIN C) 1000 MG tablet Take 1,000 mg by mouth daily. Plus Vitamin E    . aspirin 81 MG tablet Take 81 mg by mouth daily.      . Boswellia-Glucosamine-Vit D (OSTEO BI-FLEX ONE PER DAY PO) Take by mouth.    . budesonide-formoterol (SYMBICORT) 160-4.5 MCG/ACT inhaler Inhale 2 puffs into the lungs 2 (two) times daily. 1 Inhaler 3  . carvedilol (COREG) 12.5 MG tablet 12.5 mg. twice daily  3  . cetirizine (ZYRTEC) 10 MG tablet Take 10 mg by mouth daily.    . Cholecalciferol (VITAMIN D3) 1.25 MG (50000 UT) CAPS TK 1 C PO 1 TIME A WK    . fluticasone (FLONASE) 50 MCG/ACT nasal spray Place 2 sprays into both nostrils daily. 29.7 g 1  . furosemide (LASIX) 40 MG tablet Take 40 mg by mouth.    . Ginger, Zingiber  officinalis, (GINGER PO) Take by mouth.    Marland Kitchen ibuprofen (ADVIL,MOTRIN) 600 MG tablet Take by mouth.    . losartan (COZAAR) 50 MG tablet Take 50 mg by mouth daily.    . memantine (NAMENDA) 10 MG tablet Take 1 tablet (10 mg total) by mouth 2 (two) times daily. 180 tablet 3  . Multiple Vitamin (MULTIVITAMIN) tablet Take 1 tablet by mouth daily.    . Naproxen Sodium (ALEVE) 220 MG CAPS Take 1 capsule by mouth 2 (two) times daily.    . nitroGLYCERIN (NITROSTAT) 0.4 MG SL tablet 1 under tongue every 5 mins x3 if needed for chest pain  3  . Probiotic Product (CULTURELLE PROBIOTICS PO) Take by mouth.    . Respiratory Therapy Supplies (FLUTTER) DEVI Use as directed. 1 each 0  . tadalafil (CIALIS) 5 MG tablet Take 5 mg by mouth as needed.     . tamsulosin (FLOMAX) 0.4 MG CAPS capsule  Take 0.4 mg by mouth daily. Not taking. Ran out of the medication.    . Turmeric (QC TUMERIC COMPLEX PO) Take by mouth.    Marland Kitchen buPROPion (WELLBUTRIN SR) 100 MG 12 hr tablet Take 100 mg by mouth daily.    . montelukast (SINGULAIR) 10 MG tablet Take 10 mg by mouth daily.     No current facility-administered medications for this encounter.    Physical Findings:  vitals were not taken for this visit.   /Unable to assess due to telephone follow up visit.  Lab Findings: Lab Results  Component Value Date   WBC 6.2 11/21/2009   HGB 18.2 (H) 11/21/2009   HCT 51.3 11/21/2009   MCV 87.2 11/21/2009   PLT 212 11/21/2009     Radiographic Findings: No results found.  Impression/Plan: 1. 69 y.o. gentleman with rising, detectable PSA of 3.10 s/p RALP in 2003 for Stage pT2c, pN0 Gleason 3+4 prostate cancer with pretreatment PSA of 7.64. He will continue to follow up with urology for ongoing PSA determinations and has an appointment scheduled with Dr. Lovena Neighbours in 09/2019 for repeat PSA and his next 6 month Eligard injection. He saw Dr. Lovena Neighbours on 07/21/19 with PSA on 07/13/19 showing undetectable levels which he is quite pleased with.  He understands what to expect with regards to PSA monitoring going forward. I will look forward to following his response to treatment via correspondence with urology, and would be happy to continue to participate in his care if clinically indicated. I talked to the patient about what to expect in the future, including his risk for erectile dysfunction and rectal bleeding. I encouraged him to call or return to the office if he has any questions regarding his previous radiation or possible radiation side effects. He was comfortable with this plan and will follow up as needed.  Today, a comprehensive survivorship care plan and treatment summary was reviewed with the patient today detailing his prostate cancer diagnosis, treatment course, potential late/long-term effects of treatment,  appropriate follow-up care with recommendations for the future, and patient education resources.  A copy of this summary, along with a letter will be sent to the patient's primary care provider via fax message after today's visit.  2. Cancer screening:  Due to Curtis Bell history and his age, he should receive screening for skin cancers, colon cancer, and lung cancer.  The information and recommendations are listed on the patient's comprehensive care plan/treatment summary and were reviewed in detail with the patient.     3. Health maintenance and wellness  promotion: Curtis Bell was encouraged to consume 5-7 servings of fruits and vegetables per day. He was provided a copy of the "Nutrition Rainbow" handout, as well as the handout "Take Control of Your Health and [place]" from the [facility].  He was also encouraged to engage in moderate to vigorous exercise for 30 minutes per day most days of the week. Information was provided regarding the [facility] fitness program, which is designed for cancer survivors to help them become more physically fit after cancer treatments. We discussed that a healthy BMI is 18.5-24.9 and that maintaining a healthy weight reduces risk of cancer recurrences.  He was instructed to limit his alcohol consumption and continue to abstain from tobacco use.  Lastly, he was encouraged to use sunscreen and wear protective clothing when in the sun.     4. Support services/counseling: It is not uncommon for this period of the patient's cancer care trajectory to be one of many emotions and stressors.  Curtis Bell was encouraged to take advantage of our many support services programs, support groups, and/or counseling in coping with his new life as a cancer survivor after completing anti-cancer treatment.  He was offered support today through active listening and expressive supportive counseling.  He was given information regarding our available services and  encouraged to contact me with any questions or for help enrolling in any of our support group/programs.       Nicholos Johns, PA-C

## 2019-08-30 NOTE — Telephone Encounter (Signed)
Appointment reminder

## 2019-09-21 ENCOUNTER — Other Ambulatory Visit: Payer: Self-pay | Admitting: Internal Medicine

## 2019-09-21 DIAGNOSIS — R918 Other nonspecific abnormal finding of lung field: Secondary | ICD-10-CM

## 2019-09-21 DIAGNOSIS — R079 Chest pain, unspecified: Secondary | ICD-10-CM

## 2019-09-29 ENCOUNTER — Other Ambulatory Visit: Payer: Self-pay | Admitting: Internal Medicine

## 2019-10-06 ENCOUNTER — Ambulatory Visit
Admission: RE | Admit: 2019-10-06 | Discharge: 2019-10-06 | Disposition: A | Discharge status: Home / Self Care | Payer: Medicare HMO | Source: Ambulatory Visit | Attending: Internal Medicine | Admitting: Internal Medicine

## 2019-10-06 ENCOUNTER — Other Ambulatory Visit: Payer: Self-pay

## 2019-10-06 DIAGNOSIS — R918 Other nonspecific abnormal finding of lung field: Secondary | ICD-10-CM

## 2019-10-06 DIAGNOSIS — R079 Chest pain, unspecified: Secondary | ICD-10-CM

## 2019-10-06 MED ORDER — IOPAMIDOL (ISOVUE-300) INJECTION 61%
75.0000 mL | Freq: Once | INTRAVENOUS | Status: AC | PRN
  Administered 2019-10-06: 75 mL via INTRAVENOUS

## 2019-10-10 ENCOUNTER — Ambulatory Visit: Payer: Medicare HMO | Admitting: Neurology

## 2019-11-01 NOTE — Progress Notes (Signed)
  Radiation Oncology         (336) [phone] ________________________________  Name: Curtis Bell MRN: [id number]  Date: 06/30/2019  DOB: [birth year]  End of Treatment Note  Diagnosis:   69 y.o. gentleman with rising, detectable PSA of 3.10 s/p RALP in 2003 for Stage pT2c, pN0 Gleason 3+4 prostate cancer with pretreatment PSA of 7.64.     Indication for treatment:  Curative, Definitive Radiotherapy       Radiation treatment dates:   05/10/2019- 06/30/2019: concurrent with ADT  Site/dose:  1. The prostate fossa and pelvic lymph nodes were initially treated to 45 Gy in 25 fractions of 1.8 Gy  2. The prostate fossa only was boosted to 68.4 Gy with 13 additional fractions of 1.8 Gy   Beams/energy:  1. The prostate fossa  and pelvic lymph nodes were initially treated using VMAT intensity modulated radiotherapy delivering 6 megavolt photons. Image guidance was performed with CB-CT studies prior to each fraction. He was immobilized with a body fix lower extremity mold.  2. The prostate fossa only was boosted using VMAT intensity modulated radiotherapy delivering 6 megavolt photons. Image guidance was performed with CB-CT studies prior to each fraction. He was immobilized with a body fix lower extremity mold.  Narrative: The patient tolerated radiation treatment relatively well.   The patient experienced some minor urinary irritation and modest fatigue.    Plan: The patient has completed radiation treatment. He will return to radiation oncology clinic for routine followup in one month. I advised him to call or return sooner if he has any questions or concerns related to his recovery or treatment. ________________________________  Sheral Apley. Tammi Klippel, M.D.

## 2019-12-12 ENCOUNTER — Other Ambulatory Visit: Payer: Self-pay | Admitting: Urology

## 2019-12-12 DIAGNOSIS — C61 Malignant neoplasm of prostate: Secondary | ICD-10-CM

## 2019-12-15 ENCOUNTER — Other Ambulatory Visit: Payer: Self-pay

## 2019-12-15 ENCOUNTER — Ambulatory Visit
Admission: RE | Admit: 2019-12-15 | Discharge: 2019-12-15 | Disposition: A | Discharge status: Home / Self Care | Payer: Medicare HMO | Source: Ambulatory Visit | Attending: Urology | Admitting: Urology

## 2019-12-15 ENCOUNTER — Other Ambulatory Visit: Payer: Medicare HMO

## 2019-12-15 DIAGNOSIS — C61 Malignant neoplasm of prostate: Secondary | ICD-10-CM

## 2019-12-17 IMAGING — US US EXTREM UP*R* COMP
1 series · 14 of 14 positions shown · non-contrast
Comparison: None.

CLINICAL DATA: Right wrist mass.

EXAM:
ULTRASOUND RIGHT UPPER EXTREMITY COMPLETE
TECHNIQUE: Ultrasound examination was performed by Dr. Artem over the area of
concern using the 18 megahertz transducer. Multiple video clips were
performed in the longitudinal and transverse directions for your
review.

[Series 1: us extrem up*right* comp · 0.06mm/px · 14 acquisitions, 14 frames shown]
[im 1/14]
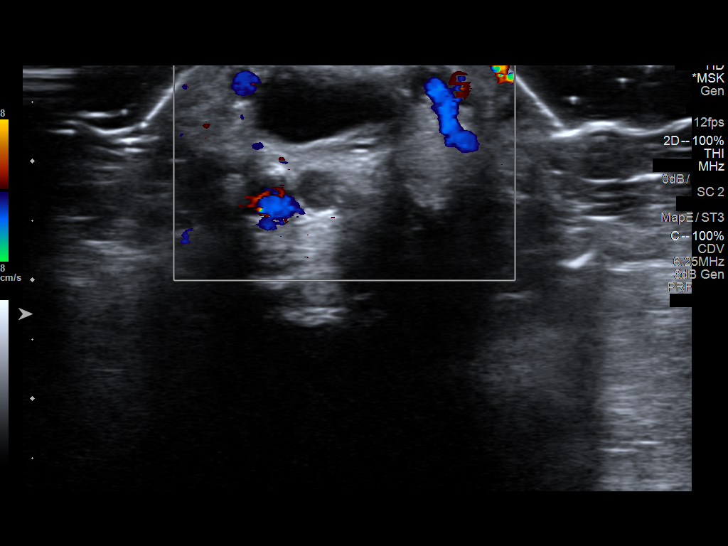
[im 2/14]
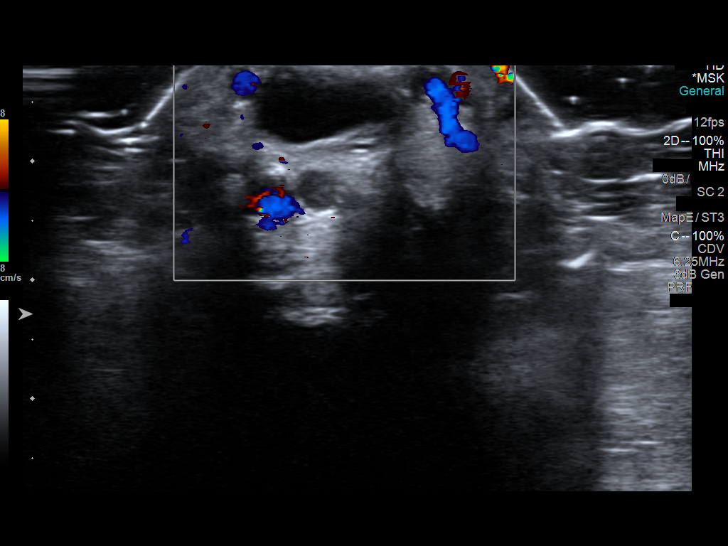
[im 3/14]
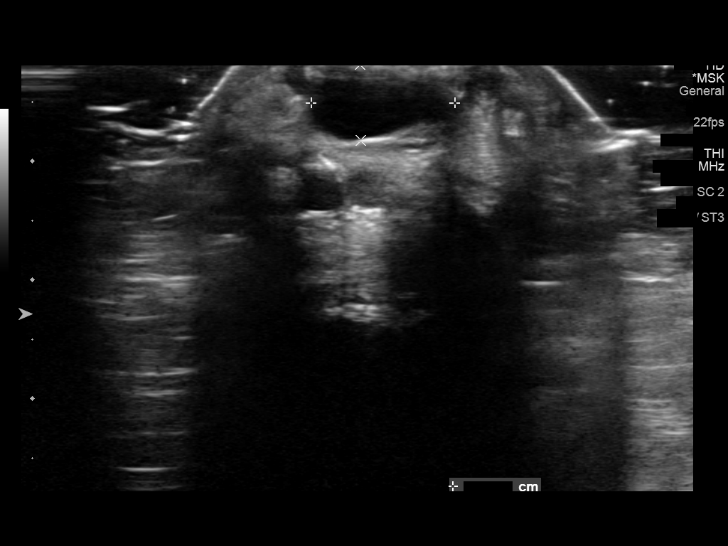
[im 4/14]
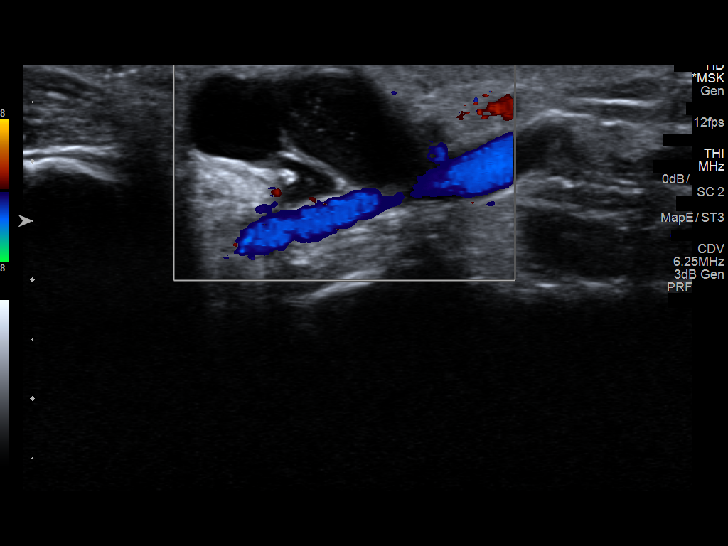
[im 5/14]
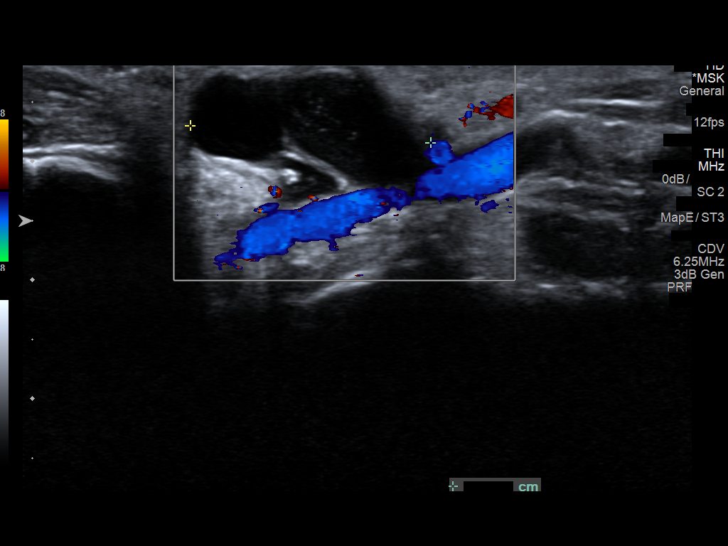
[im 6/14]
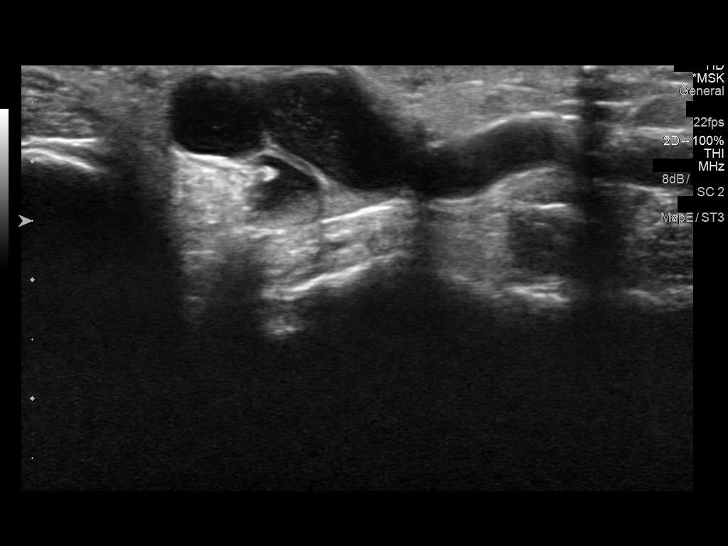
[im 7/14]
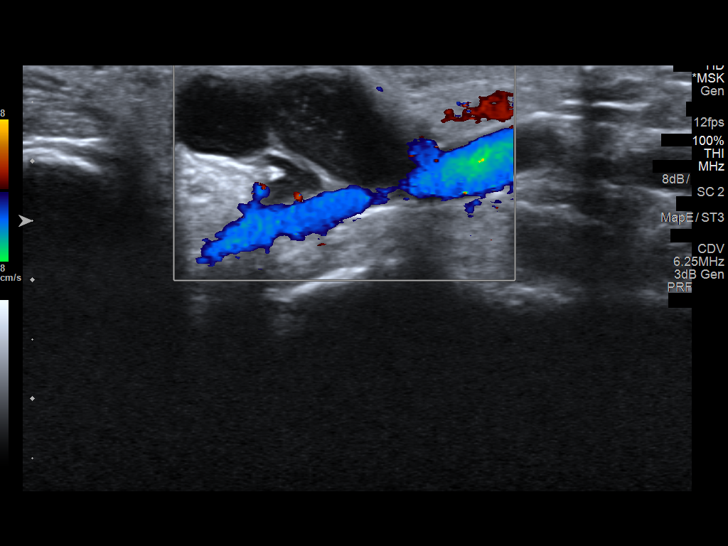
[im 8/14]
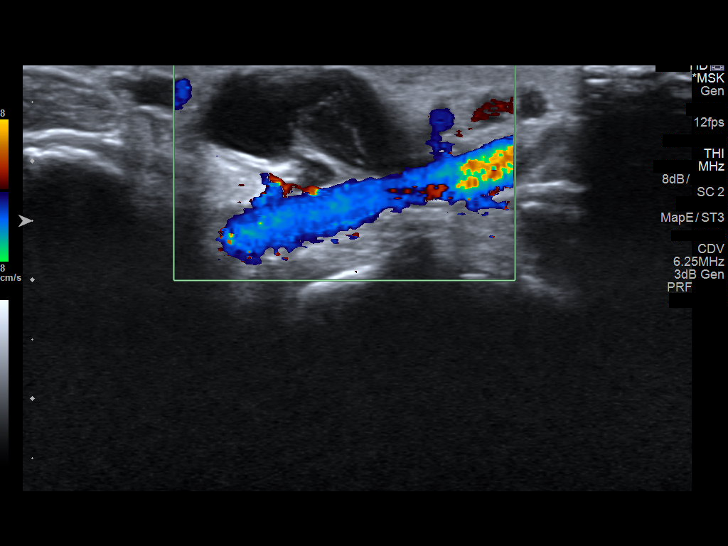
[im 9/14]
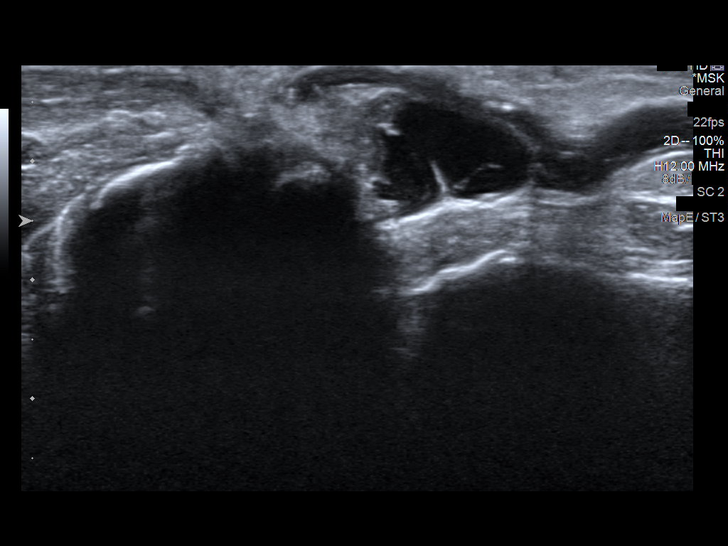
[im 10/14]
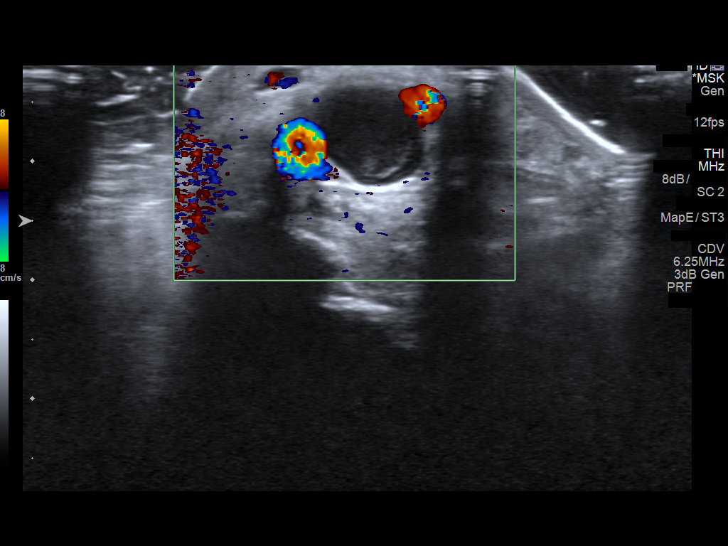
[im 11/14]
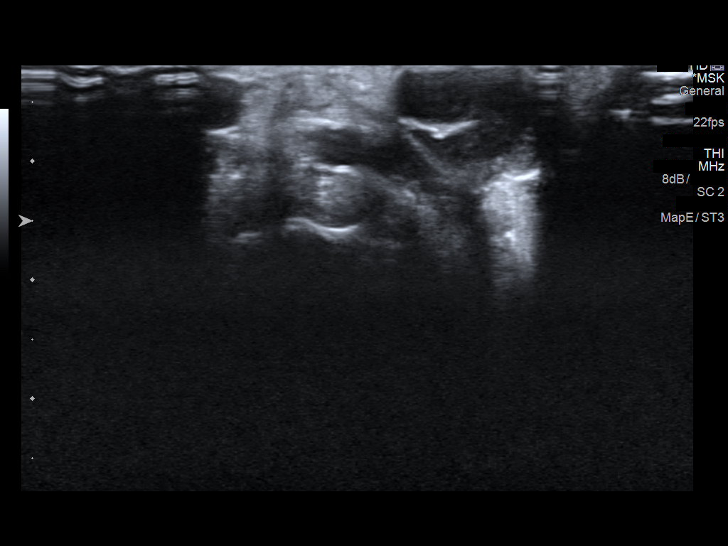
[im 12/14]
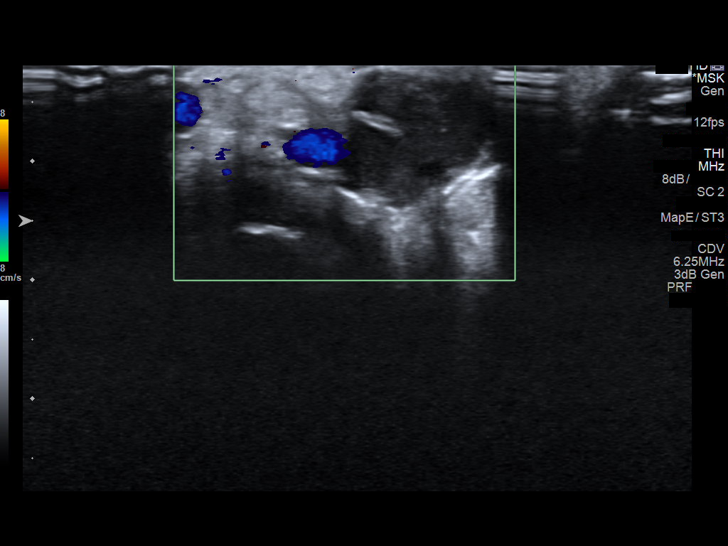
[im 13/14]
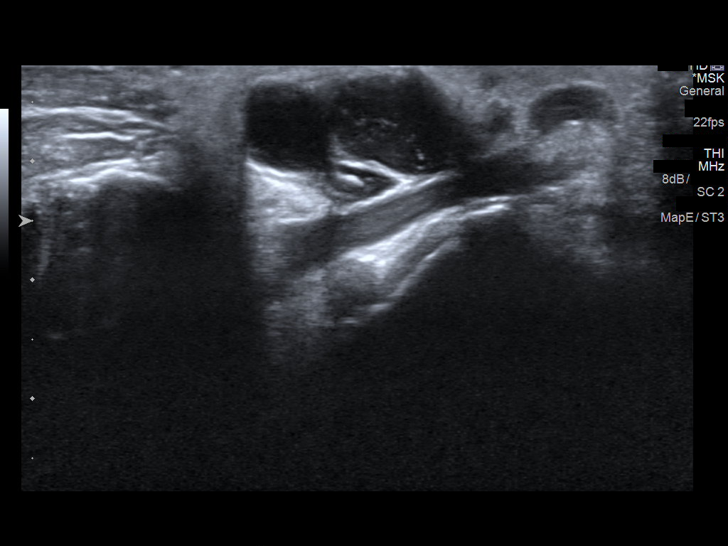
[im 14/14]
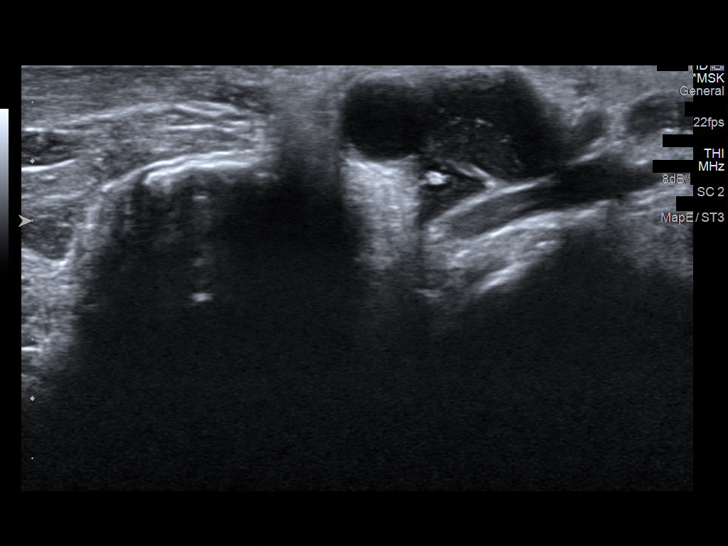

[14 of 14 positions shown; findings below may reference images not displayed]

FINDINGS: There is a complex cystic lesion measuring 20 x 12 x 11 mm on the
volar aspect of the right wrist superficial to the radial artery.
This lobulated cyst has a small amount of debris within it. There is
no internal vascularity.

There is no discrete connection with the adjacent tendons. There is
suggestion of a tiny possible communication with the radiocarpal
joint this is not definitive.

No solid mass lesions.
IMPRESSION: Complex benign-appearing ganglion cyst of the volar aspect of the
right wrist just superficial to the radial artery. Possible tiny
communication with the radiocarpal joint.

## 2020-05-21 ENCOUNTER — Ambulatory Visit: Payer: Medicare HMO | Admitting: Neurology

## 2020-05-21 DIAGNOSIS — Z029 Encounter for administrative examinations, unspecified: Secondary | ICD-10-CM

## 2020-05-22 ENCOUNTER — Encounter: Payer: Self-pay | Admitting: Neurology

## 2020-06-27 ENCOUNTER — Other Ambulatory Visit: Payer: Self-pay | Admitting: Otolaryngology

## 2020-06-27 DIAGNOSIS — K219 Gastro-esophageal reflux disease without esophagitis: Secondary | ICD-10-CM

## 2020-07-23 ENCOUNTER — Other Ambulatory Visit: Payer: Self-pay | Admitting: Internal Medicine

## 2020-07-23 ENCOUNTER — Ambulatory Visit
Admission: RE | Admit: 2020-07-23 | Discharge: 2020-07-23 | Disposition: A | Discharge status: Home / Self Care | Payer: Medicare HMO | Source: Ambulatory Visit | Attending: Internal Medicine | Admitting: Internal Medicine

## 2020-07-23 ENCOUNTER — Ambulatory Visit: Payer: Medicare HMO

## 2020-07-23 DIAGNOSIS — R0789 Other chest pain: Secondary | ICD-10-CM

## 2020-07-23 DIAGNOSIS — R059 Cough, unspecified: Secondary | ICD-10-CM

## 2020-08-14 ENCOUNTER — Ambulatory Visit
Admission: RE | Admit: 2020-08-14 | Discharge: 2020-08-14 | Disposition: A | Discharge status: Home / Self Care | Payer: Medicare HMO | Source: Ambulatory Visit | Attending: Otolaryngology | Admitting: Otolaryngology

## 2020-08-14 DIAGNOSIS — K219 Gastro-esophageal reflux disease without esophagitis: Secondary | ICD-10-CM

## 2021-05-06 ENCOUNTER — Other Ambulatory Visit: Payer: Self-pay

## 2021-05-06 ENCOUNTER — Other Ambulatory Visit: Payer: Self-pay | Admitting: Internal Medicine

## 2021-05-06 ENCOUNTER — Ambulatory Visit
Admission: RE | Admit: 2021-05-06 | Discharge: 2021-05-06 | Disposition: A | Discharge status: Home / Self Care | Payer: Medicare HMO | Source: Ambulatory Visit | Attending: Internal Medicine | Admitting: Internal Medicine

## 2021-05-06 DIAGNOSIS — R52 Pain, unspecified: Secondary | ICD-10-CM

## 2022-02-12 ENCOUNTER — Encounter (HOSPITAL_COMMUNITY): Payer: Self-pay

## 2022-02-12 ENCOUNTER — Emergency Department (HOSPITAL_BASED_OUTPATIENT_CLINIC_OR_DEPARTMENT_OTHER): Payer: Medicare HMO

## 2022-02-12 ENCOUNTER — Other Ambulatory Visit (HOSPITAL_BASED_OUTPATIENT_CLINIC_OR_DEPARTMENT_OTHER): Payer: Self-pay

## 2022-02-12 ENCOUNTER — Emergency Department (HOSPITAL_BASED_OUTPATIENT_CLINIC_OR_DEPARTMENT_OTHER)
Admission: EM | Admit: 2022-02-12 | Discharge: 2022-02-12 | Disposition: A | Discharge status: Home / Self Care | Payer: Medicare HMO | Attending: Emergency Medicine | Admitting: Emergency Medicine

## 2022-02-12 ENCOUNTER — Other Ambulatory Visit: Payer: Self-pay

## 2022-02-12 ENCOUNTER — Encounter (HOSPITAL_BASED_OUTPATIENT_CLINIC_OR_DEPARTMENT_OTHER): Payer: Self-pay | Admitting: Emergency Medicine

## 2022-02-12 DIAGNOSIS — Z8546 Personal history of malignant neoplasm of prostate: Secondary | ICD-10-CM | POA: Diagnosis not present

## 2022-02-12 DIAGNOSIS — Z79899 Other long term (current) drug therapy: Secondary | ICD-10-CM | POA: Insufficient documentation

## 2022-02-12 DIAGNOSIS — I10 Essential (primary) hypertension: Secondary | ICD-10-CM | POA: Diagnosis not present

## 2022-02-12 DIAGNOSIS — Z7982 Long term (current) use of aspirin: Secondary | ICD-10-CM | POA: Insufficient documentation

## 2022-02-12 DIAGNOSIS — R911 Solitary pulmonary nodule: Secondary | ICD-10-CM | POA: Diagnosis not present

## 2022-02-12 DIAGNOSIS — R109 Unspecified abdominal pain: Secondary | ICD-10-CM | POA: Diagnosis present

## 2022-02-12 DIAGNOSIS — K56609 Unspecified intestinal obstruction, unspecified as to partial versus complete obstruction: Secondary | ICD-10-CM | POA: Diagnosis not present

## 2022-02-12 DIAGNOSIS — R1084 Generalized abdominal pain: Secondary | ICD-10-CM | POA: Diagnosis present

## 2022-02-12 DIAGNOSIS — R918 Other nonspecific abnormal finding of lung field: Secondary | ICD-10-CM

## 2022-02-12 LAB — COMPREHENSIVE METABOLIC PANEL
ALT: 26 U/L (ref 0–44)
AST: 21 U/L (ref 15–41)
Albumin: 3.8 g/dL (ref 3.5–5.0)
Alkaline Phosphatase: 76 U/L (ref 38–126)
Anion gap: 8 (ref 5–15)
BUN: 18 mg/dL (ref 8–23)
CO2: 28 mmol/L (ref 22–32)
Calcium: 8.9 mg/dL (ref 8.9–10.3)
Chloride: 104 mmol/L (ref 98–111)
Creatinine, Ser: 0.74 mg/dL (ref 0.61–1.24)
GFR, Estimated: >60 mL/min (ref >60)
Glucose, Bld: 126 mg/dL — ABNORMAL HIGH (ref 70–99)
Potassium: 3.7 mmol/L (ref 3.5–5.1)
Sodium: 140 mmol/L (ref 135–145)
Total Bilirubin: 0.4 mg/dL (ref 0.3–1.2)
Total Protein: 7.3 g/dL (ref 6.5–8.1)

## 2022-02-12 LAB — CBC WITH DIFFERENTIAL/PLATELET
Abs Immature Granulocytes: 0.02 10*3/uL (ref 0.00–0.07)
Basophils Absolute: 0 10*3/uL (ref 0.0–0.1)
Basophils Relative: 0 %
Eosinophils Absolute: 0.3 10*3/uL (ref 0.0–0.5)
Eosinophils Relative: 4 %
HCT: 44.4 % (ref 39.0–52.0)
Hemoglobin: 14.9 g/dL (ref 13.0–17.0)
Immature Granulocytes: 0 %
Lymphocytes Relative: 12 %
Lymphs Abs: 0.8 10*3/uL (ref 0.7–4.0)
MCH: 29.6 pg (ref 26.0–34.0)
MCHC: 33.6 g/dL (ref 30.0–36.0)
MCV: 88.1 fL (ref 80.0–100.0)
Monocytes Absolute: 0.6 10*3/uL (ref 0.1–1.0)
Monocytes Relative: 9 %
Neutro Abs: 4.5 10*3/uL (ref 1.7–7.7)
Neutrophils Relative %: 75 %
Platelets: 209 10*3/uL (ref 150–400)
RBC: 5.04 MIL/uL (ref 4.22–5.81)
RDW: 12.6 % (ref 11.5–15.5)
WBC: 6.1 10*3/uL (ref 4.0–10.5)
nRBC: 0 % (ref 0.0–0.2)

## 2022-02-12 LAB — URINALYSIS, ROUTINE W REFLEX MICROSCOPIC
Bilirubin Urine: NEGATIVE
Glucose, UA: NEGATIVE mg/dL
Hgb urine dipstick: NEGATIVE
Ketones, ur: NEGATIVE mg/dL
Leukocytes,Ua: NEGATIVE
Nitrite: NEGATIVE
Protein, ur: NEGATIVE mg/dL
Specific Gravity, Urine: 1.02 (ref 1.005–1.030)
pH: 7 (ref 5.0–8.0)

## 2022-02-12 LAB — LIPASE, BLOOD: Lipase: 32 U/L (ref 11–51)

## 2022-02-12 MED ORDER — DROPERIDOL 2.5 MG/ML IJ SOLN
1.2500 mg | Freq: Once | INTRAMUSCULAR | Status: AC
  Administered 2022-02-12: 1.25 mg via INTRAVENOUS
  Filled 2022-02-12: qty 2

## 2022-02-12 MED ORDER — ONDANSETRON 4 MG PO TBDP
4.0000 mg | ORAL_TABLET | Freq: Three times a day (TID) | ORAL | 0 refills | Status: DC | PRN
  Filled 2022-02-12: qty 12, 4d supply, fill #0

## 2022-02-12 MED ORDER — FENTANYL CITRATE PF 50 MCG/ML IJ SOSY
50.0000 ug | PREFILLED_SYRINGE | Freq: Once | INTRAMUSCULAR | Status: AC
  Administered 2022-02-12: 50 ug via INTRAVENOUS
  Filled 2022-02-12: qty 1

## 2022-02-12 NOTE — ED Provider Notes (Signed)
  Physical Exam  BP 138/78   Pulse 63   Temp 98.1 F (36.7 C) (Oral)   Resp 16   Ht '5\' 7"'$  (1.702 m)   Wt 105.2 kg   SpO2 94%   BMI 36.34 kg/m   Physical Exam  Procedures  Procedures  ED Course / MDM    Medical Decision Making Amount and/or Complexity of Data Reviewed Labs: ordered. Radiology: ordered.  Risk Prescription drug management.   Received care of pt from previous providers. Has been awaiting admission for SBO. Reevaluated by Dr. Lorin Mercy.  Felt to be appropriate for discharge at this time.  Reevaluated pt and reports pain, nausea have improved. Tolerating po. Patient discharged in stable condition with understanding of reasons to return.        Gareth Morgan, MD 02/12/22 2252

## 2022-02-12 NOTE — ED Notes (Signed)
Hospitalist saw pt via ipad. Gave PO challenge with crackers per hospitalist.

## 2022-02-12 NOTE — ED Triage Notes (Signed)
Abdominal pain X 2 hours across front LBM yesterday morning.

## 2022-02-12 NOTE — ED Notes (Signed)
Pt explained risks and benefits of NG tube to pt and was made aware of admission status with a consult to general surgery. Pt is requesting time to discuss his decision with his family member who is at the bedside. NG tube supplies at bedside.

## 2022-02-12 NOTE — ED Provider Notes (Addendum)
[facility] EMERGENCY DEPARTMENT Provider Note   CSN: [id number] Arrival date & time: 02/12/22  0330     History  Chief Complaint  Patient presents with   Abdominal Pain    Curtis Bell is a 71 y.o. male.  The history is provided by the patient.  Abdominal Pain Pain location:  Generalized Pain quality: sharp   Pain radiates to:  Does not radiate Pain severity:  Severe Onset quality:  Sudden Duration:  2 hours Timing:  Constant Progression:  Waxing and waning Chronicity:  New Context: not recent travel   Relieved by:  Nothing Worsened by:  Nothing Ineffective treatments:  None tried Associated symptoms: nausea and vomiting   Associated symptoms: no diarrhea and no fever   Risk factors: being elderly   Patient with a h/o prostate cancer presents with abdominal pain and distention and vomiting 2 hours prior to arrival     Past Medical History:  Diagnosis Date   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 Authorizing Provider  acetaminophen (TYLENOL ARTHRITIS PAIN) 650 MG CR tablet Per bottle as needed    [provider]  albuterol (VENTOLIN HFA) 108 (90 Base) MCG/ACT inhaler Inhale into the lungs every 6 (six) hours as needed for wheezing or shortness of breath. Patient has run out of this medication    [provider]  amLODipine (NORVASC) 10 MG tablet Take 0.5 tablets by mouth every morning.  03/31/15   [provider]  Ascorbic Acid (VITAMIN C) 1000 MG tablet Take 1,000 mg by mouth daily. Plus Vitamin E    [provider]  aspirin 81 MG tablet Take 81 mg by mouth daily.       [provider]  Boswellia-Glucosamine-Vit D (OSTEO BI-FLEX ONE PER DAY PO) Take by mouth.    [provider]  budesonide-formoterol (SYMBICORT) 160-4.5 MCG/ACT inhaler Inhale 2 puffs into the lungs 2 (two) times daily. 06/05/15   Mannam, Hart Robinsons, MD  buPROPion (WELLBUTRIN SR) 100 MG 12 hr tablet Take 100 mg by mouth daily. 07/07/19   [provider]  carvedilol (COREG) 12.5 MG tablet 12.5 mg. twice daily 08/13/15   [provider]  cetirizine (ZYRTEC) 10 MG tablet Take 10 mg by mouth daily.    [provider]  Cholecalciferol (VITAMIN D3) 1.25 MG (50000 UT) CAPS TK 1 C PO 1 TIME A WK 05/03/18   [provider]  fluticasone (FLONASE) 50 MCG/ACT nasal spray Place 2 sprays into both nostrils daily. 06/11/15   Mannam, Hart Robinsons, MD  furosemide (LASIX) 40 MG tablet Take 40 mg by mouth.    [provider]  Ginger, Zingiber officinalis, (GINGER PO) Take by mouth.    [provider]  ibuprofen (ADVIL,MOTRIN) 600 MG tablet Take by mouth.    [provider]  losartan (COZAAR) 50 MG tablet Take 50 mg by mouth daily. 01/14/19   [provider]  memantine (NAMENDA) 10 MG tablet Take 1 tablet (10 mg total) by mouth 2 (two) times daily. 03/11/19   Cameron Sprang, MD  montelukast (SINGULAIR) 10 MG tablet Take 10 mg by mouth daily. 06/08/19   [provider]  Multiple Vitamin (MULTIVITAMIN) tablet Take 1 tablet by mouth daily.    [provider]  Naproxen Sodium (ALEVE) 220 MG CAPS Take 1 capsule by mouth 2 (two) times daily.    [provider]  nitroGLYCERIN (NITROSTAT) 0.4 MG SL tablet 1 under tongue every 5 mins x3 if needed for chest pain 08/20/15   [provider]  Probiotic Product (CULTURELLE PROBIOTICS PO) Take by mouth.    [provider]  Respiratory Therapy Supplies (FLUTTER) DEVI Use as directed. 08/30/15   Mannam, Hart Robinsons, MD  tadalafil (CIALIS) 5 MG tablet Take 5 mg by mouth as  needed.     [provider]  tamsulosin (FLOMAX) 0.4 MG CAPS capsule Take 0.4 mg by mouth daily. Not taking. Ran out of the medication. 04/06/19   [provider]  Turmeric (QC TUMERIC COMPLEX PO) Take by mouth.    [provider]      Allergies    Patient has no known allergies.    Review of Systems   Review of Systems  Constitutional:  Negative for fever.  HENT:  Negative for facial swelling.   Respiratory:  Negative for wheezing and stridor.   Gastrointestinal:  Positive for abdominal distention, abdominal pain, nausea and vomiting. Negative for diarrhea.    Physical Exam Updated Vital Signs BP (!) 143/70   Pulse (!) 57   Temp (!) 97.5 F (36.4 C) (Oral)   Resp 15   Ht '5\' 7"'$  (1.702 m)   Wt 105.2 kg   SpO2 97%   BMI 36.34 kg/m  Physical Exam Vitals and nursing note reviewed.  Constitutional:      General: He is not in acute distress.    Appearance: He is well-developed. He is not diaphoretic.  HENT:     Head: Normocephalic and atraumatic.  Eyes:     Conjunctiva/sclera: Conjunctivae normal.     Pupils: Pupils are equal, round, and reactive to light.  Cardiovascular:     Rate and Rhythm: Normal rate and regular rhythm.  Pulmonary:     Effort: Pulmonary effort is normal.     Breath sounds: Normal breath sounds. No wheezing or rales.  Abdominal:     General: Bowel sounds are decreased. There is distension.     Palpations: Abdomen is soft.     Tenderness: There is no abdominal tenderness. There is no guarding or rebound. Negative signs include Murphy's sign and McBurney's sign.  Musculoskeletal:        General: Normal range of motion.     Cervical back: Normal range of motion and neck supple.  Skin:    General: Skin is warm and dry.     Capillary Refill: Capillary refill takes less than 2 seconds.  Neurological:     General: No focal deficit present.     Mental Status: He is alert and oriented to person, place, and time.  Psychiatric:         Mood and Affect: Mood normal.     ED Results / Procedures / Treatments   Labs (all labs ordered are listed, but only abnormal results are displayed) Results for orders placed or performed during the hospital encounter of 02/12/22  CBC with Differential  Result Value Ref Range   WBC 6.1 4.0 - 10.5 K/uL   RBC 5.04 4.22 - 5.81 MIL/uL   Hemoglobin 14.9 13.0 - 17.0 g/dL   HCT 44.4 39.0 - 52.0 %   MCV 88.1 80.0 - 100.0 fL  MCH 29.6 26.0 - 34.0 pg   MCHC 33.6 30.0 - 36.0 g/dL   RDW 12.6 11.5 - 15.5 %   Platelets 209 150 - 400 K/uL   nRBC 0.0 0.0 - 0.2 %   Neutrophils Relative % 75 %   Neutro Abs 4.5 1.7 - 7.7 K/uL   Lymphocytes Relative 12 %   Lymphs Abs 0.8 0.7 - 4.0 K/uL   Monocytes Relative 9 %   Monocytes Absolute 0.6 0.1 - 1.0 K/uL   Eosinophils Relative 4 %   Eosinophils Absolute 0.3 0.0 - 0.5 K/uL   Basophils Relative 0 %   Basophils Absolute 0.0 0.0 - 0.1 K/uL   Immature Granulocytes 0 %   Abs Immature Granulocytes 0.02 0.00 - 0.07 K/uL  Comprehensive metabolic panel  Result Value Ref Range   Sodium 140 135 - 145 mmol/L   Potassium 3.7 3.5 - 5.1 mmol/L   Chloride 104 98 - 111 mmol/L   CO2 28 22 - 32 mmol/L   Glucose, Bld 126 (H) 70 - 99 mg/dL   BUN 18 8 - 23 mg/dL   Creatinine, Ser 0.74 0.61 - 1.24 mg/dL   Calcium 8.9 8.9 - 10.3 mg/dL   Total Protein 7.3 6.5 - 8.1 g/dL   Albumin 3.8 3.5 - 5.0 g/dL   AST 21 15 - 41 U/L   ALT 26 0 - 44 U/L   Alkaline Phosphatase 76 38 - 126 U/L   Total Bilirubin 0.4 0.3 - 1.2 mg/dL   GFR, Estimated >60 >60 mL/min   Anion gap 8 5 - 15  Lipase, blood  Result Value Ref Range   Lipase 32 11 - 51 U/L   CT Renal Stone Study  Result Date: 02/12/2022 CLINICAL DATA:  Abdominal and flank pain with stone suspected EXAM: CT ABDOMEN AND PELVIS WITHOUT CONTRAST TECHNIQUE: Multidetector CT imaging of the abdomen and pelvis was performed following the standard protocol without IV contrast. RADIATION DOSE REDUCTION: This exam was  performed according to the departmental dose-optimization program which includes automated exposure control, adjustment of the mA and/or kV according to patient size and/or use of iterative reconstruction technique. COMPARISON:  PET CT 03/29/2019 FINDINGS: Lower chest: Two indistinct nodular opacities in the right lower lobe measuring up to 13 mm on 2:10. More atelectatic or scar like density over the left diaphragm. Hepatobiliary: No focal liver abnormality.No evidence of biliary obstruction or stone. Pancreas: Unremarkable. Spleen: Unremarkable. Adrenals/Urinary Tract: Negative adrenals. No hydronephrosis or stone. Cystic densities in the bilateral lower kidney, unchanged from prior PET-CT and measuring up to 7.5 cm on the right. No imaging follow-up recommended. A nodule in the retroperitoneum of the low posterior pararenal space on the right is unchanged since PET CT at 15 mm, non worrisome given multi year stability. Stable bladder which is only partially distended. Stomach/Bowel: Cluster of fluid containing bowel loops without over dilatation but notable very collapsed ileal loops distally. No clear transition point. No bowel wall thickening or perforation. The stomach is moderately distended. Vascular/Lymphatic: Atheromatous calcification which is generalized. No mass or adenopathy. Reproductive:Prostatectomy.  No nodularity seen at the surgical bed. Other: No ascites or pneumoperitoneum. Musculoskeletal: No acute abnormalities.  No sclerotic bone lesions. IMPRESSION: 1. Possible low-grade/partial small bowel obstruction - there is a cluster of fluid-filled loops and very decompressed distal small bowel. No obstructing lesion is seen. 2. Two indistinct nodules at the right lung base not seen by chest CT in 2021. These could be inflammatory but need close follow-up in  the setting of prostate cancer history, recommend dedicated chest CT in 2-3 months. Electronically Signed   By: Jorje Guild M.D.   On:  02/12/2022 04:59    EKG EKG Interpretation  Date/Time:  Wednesday February 12 2022 04:02:31 EST Ventricular Rate:  59 PR Interval:  180 QRS Duration: 86 QT Interval:  422 QTC Calculation: 418 R Axis:   63 Text Interpretation: Sinus rhythm Confirmed by Randal Buba, [employer] ([id number]) on 02/12/2022 4:05:05 AM  Radiology CT Renal Stone Study  Result Date: 02/12/2022 CLINICAL DATA:  Abdominal and flank pain with stone suspected EXAM: CT ABDOMEN AND PELVIS WITHOUT CONTRAST TECHNIQUE: Multidetector CT imaging of the abdomen and pelvis was performed following the standard protocol without IV contrast. RADIATION DOSE REDUCTION: This exam was performed according to the departmental dose-optimization program which includes automated exposure control, adjustment of the mA and/or kV according to patient size and/or use of iterative reconstruction technique. COMPARISON:  PET CT 03/29/2019 FINDINGS: Lower chest: Two indistinct nodular opacities in the right lower lobe measuring up to 13 mm on 2:10. More atelectatic or scar like density over the left diaphragm. Hepatobiliary: No focal liver abnormality.No evidence of biliary obstruction or stone. Pancreas: Unremarkable. Spleen: Unremarkable. Adrenals/Urinary Tract: Negative adrenals. No hydronephrosis or stone. Cystic densities in the bilateral lower kidney, unchanged from prior PET-CT and measuring up to 7.5 cm on the right. No imaging follow-up recommended. A nodule in the retroperitoneum of the low posterior pararenal space on the right is unchanged since PET CT at 15 mm, non worrisome given multi year stability. Stable bladder which is only partially distended. Stomach/Bowel: Cluster of fluid containing bowel loops without over dilatation but notable very collapsed ileal loops distally. No clear transition point. No bowel wall thickening or perforation. The stomach is moderately distended. Vascular/Lymphatic: Atheromatous calcification which is generalized. No mass or  adenopathy. Reproductive:Prostatectomy.  No nodularity seen at the surgical bed. Other: No ascites or pneumoperitoneum. Musculoskeletal: No acute abnormalities.  No sclerotic bone lesions. IMPRESSION: 1. Possible low-grade/partial small bowel obstruction - there is a cluster of fluid-filled loops and very decompressed distal small bowel. No obstructing lesion is seen. 2. Two indistinct nodules at the right lung base not seen by chest CT in 2021. These could be inflammatory but need close follow-up in the setting of prostate cancer history, recommend dedicated chest CT in 2-3 months. Electronically Signed   By: Jorje Guild M.D.   On: 02/12/2022 04:59    Procedures Procedures    Medications Ordered in ED Medications  fentaNYL (SUBLIMAZE) injection 50 mcg (50 mcg Intravenous Given 02/12/22 0408)  droperidol (INAPSINE) 2.5 MG/ML injection 1.25 mg (1.25 mg Intravenous Given 02/12/22 0407)    ED Course/ Medical Decision Making/ A&P                           Medical Decision Making Patient with sudden onset abdominal pain and nausea and vomiting with distention   Problems Addressed: Lung nodules:    Details: Will need follow up as an outpatient, patient informed of this to let his oncologist follow up.    Amount and/or Complexity of Data Reviewed Independent Historian:     Details: Relative see above  External Data Reviewed: notes.    Details: Previous notes reviewed  Labs: ordered.    Details: All labs reviewed:  normal sodium 140, normal potassium 3.7, normal creatinine .74, normal LFTs.  Normal white count 6.1, normal hemoglobin 14.9, normal platelets 209k Radiology: ordered  and independent interpretation performed.    Details: No stones by me Discussion of management or test interpretation with external provider(s): Case d/w Dr. Georgette Dover of [facility], [facility] will consult Case d/w Dr. Sidney [facility] who will admit   Risk Prescription drug management. Parenteral controlled  substances. Decision regarding hospitalization. Risk Details: Nursing attempted NG tube x 2 and patient was unable to tolerate this.  Will continue NPO.      Final Clinical Impression(s) / ED Diagnoses Final diagnoses:  Lung nodules  SBO (small bowel obstruction) ([place])   The patient appears reasonably stabilized for admission considering the current resources, flow, and capabilities available in the ED at this time, and I doubt any other [facility] requiring further screening and/or treatment in the ED prior to admission.      Kelleen Stolze, MD 02/12/22 [phone]

## 2022-02-12 NOTE — Consult Note (Signed)
ER Consult - Televisit   Patient: Curtis Bell QIH:[id number] DOB: 01/08/1951 DOA: 02/12/2022 DOS: the patient was seen and examined on 02/12/2022 PCP: Rogers Blocker, MD     Chief Complaint: abdominal pain  HPI: Curtis Bell is a 71 y.o. male with medical history significant of remote recurrent prostate CA and HTN presenting with abdominal pain.   He reports that he ate and went to sleep in a chair.  He awoke with lower abdominal pain about 0300 this AM.  No n/v.  He has had 2 BMs in the ER.  He feels much better now.  He drank some water earlier, able to keep it down without difficulties.  He does not think he needs to stay overnight.      ER Course:  [facility] to [facility] transfer, per Dr. Sidney Ace:  71 yo M with h/o HTN, HL, GERD, emphysema and previous of Hernia repair and mesh, coming with abdominal distension and  pain with vomiting and not passing flatus. Abd CT is showing suspected low grade/ partial SBO and 2 lung nodules that will need follow-up. CBC and CMP nl.  Pt was given inapsine and fentanyl and NG tube will be placed.  Dr Georgette Dover was called and will consult  when pt arrives( will need notification).     Review of Systems: As mentioned in the history of present illness. All other systems reviewed and are negative. 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 His smoking use included cigarettes. He has a 15.00 pack-year smoking history. He has never used  smokeless tobacco. He reports that he does not drink alcohol and does not use drugs.  No Known Allergies  Family History  Problem Relation Age of Onset   Memory loss Mother        Marena Chancy if ever formally diagnosed with dementia   Prostate cancer Father        lived to be 3 years old   Lung disease Father    Cataracts Father    Amblyopia Neg Hx    Blindness Neg Hx    Glaucoma Neg Hx    Macular degeneration Neg Hx    Retinal detachment Neg Hx    Strabismus Neg Hx    Retinitis pigmentosa Neg Hx    Breast cancer Neg Hx    Colon cancer Neg Hx    Pancreatic cancer Neg Hx     Prior to Admission medications   Medication Sig Start Date End Date Taking? Authorizing Provider  acetaminophen (TYLENOL ARTHRITIS PAIN) 650 MG CR tablet Per bottle as needed    [provider]  albuterol (VENTOLIN HFA) 108 (90 Base) MCG/ACT inhaler Inhale into the lungs every 6 (six) hours as needed for wheezing or shortness of breath. Patient has run out of this medication    [provider]  amLODipine (NORVASC) 10 MG tablet Take 0.5 tablets by  mouth every morning.  03/31/15   [provider]  Ascorbic Acid (VITAMIN C) 1000 MG tablet Take 1,000 mg by mouth daily. Plus Vitamin E    [provider]  aspirin 81 MG tablet Take 81 mg by mouth daily.      [provider]  Boswellia-Glucosamine-Vit D (OSTEO BI-FLEX ONE PER DAY PO) Take by mouth.    [provider]  budesonide-formoterol (SYMBICORT) 160-4.5 MCG/ACT inhaler Inhale 2 puffs into the lungs 2 (two) times daily. 06/05/15   Mannam, Hart Robinsons, MD  buPROPion (WELLBUTRIN SR) 100 MG 12 hr tablet Take 100 mg by mouth daily. 07/07/19   [provider]  carvedilol (COREG) 12.5 MG tablet 12.5 mg. twice daily 08/13/15   [provider]  cetirizine (ZYRTEC) 10 MG tablet Take 10 mg by mouth daily.    [provider]  Cholecalciferol (VITAMIN D3) 1.25 MG (50000 UT) CAPS TK 1 C PO 1 TIME A WK 05/03/18    [provider]  fluticasone (FLONASE) 50 MCG/ACT nasal spray Place 2 sprays into both nostrils daily. 06/11/15   Mannam, Hart Robinsons, MD  furosemide (LASIX) 40 MG tablet Take 40 mg by mouth.    [provider]  Ginger, Zingiber officinalis, (GINGER PO) Take by mouth.    [provider]  ibuprofen (ADVIL,MOTRIN) 600 MG tablet Take by mouth.    [provider]  losartan (COZAAR) 50 MG tablet Take 50 mg by mouth daily. 01/14/19   [provider]  memantine (NAMENDA) 10 MG tablet Take 1 tablet (10 mg total) by mouth 2 (two) times daily. 03/11/19   Cameron Sprang, MD  montelukast (SINGULAIR) 10 MG tablet Take 10 mg by mouth daily. 06/08/19   [provider]  Multiple Vitamin (MULTIVITAMIN) tablet Take 1 tablet by mouth daily.    [provider]  Naproxen Sodium (ALEVE) 220 MG CAPS Take 1 capsule by mouth 2 (two) times daily.    [provider]  nitroGLYCERIN (NITROSTAT) 0.4 MG SL tablet 1 under tongue every 5 mins x3 if needed for chest pain 08/20/15   [provider]  Probiotic Product (CULTURELLE PROBIOTICS PO) Take by mouth.    [provider]  Respiratory Therapy Supplies (FLUTTER) DEVI Use as directed. 08/30/15   Mannam, Hart Robinsons, MD  tadalafil (CIALIS) 5 MG tablet Take 5 mg by mouth as needed.     [provider]  tamsulosin (FLOMAX) 0.4 MG CAPS capsule Take 0.4 mg by mouth daily. Not taking. Ran out of the medication. 04/06/19   [provider]  Turmeric (QC TUMERIC COMPLEX PO) Take by mouth.    [provider]    Physical Exam: Vitals:   02/12/22 0454 02/12/22 0500 02/12/22 0600 02/12/22 0948  BP:  128/73 (!) 147/86 138/78  Pulse: (!) 57 (!) 56 63 63  Resp: '15 16 19 16  '$ Temp:    98.1 F (36.7 C)  TempSrc:    Oral  SpO2: 97% 97% 98% 94%  Weight:      Height:       Virtual visit with exam performed by RN with virtual oversight via [employer]  General:  Appears calm and  comfortable and is in NAD Eyes:   EOMI, normal lids, iris ENT:  grossly normal hearing, lips & tongue, mmm; appropriate dentition Neck:  no LAD, masses or thyromegaly - per RN Cardiovascular:  RRR per RN. Tr-1+ LE edema.  Respiratory:   CTA bilaterally with no wheezes/rales/rhonchi per RN.  Normal respiratory  effort. Abdomen:  soft, NT, ND, NABS - per RN Skin:  no rash or induration seen on limited exam Musculoskeletal:  grossly normal tone BUE/BLE, good ROM, no bony abnormality Psychiatric:  grossly normal mood and affect, speech fluent and appropriate, AOx3 Neurologic:  CN 2-12 grossly intact, moves all extremities in coordinated fashion   Radiological Exams on Admission: Independently reviewed - see discussion in A/P where applicable  CT Renal Stone Study  Result Date: 02/12/2022 CLINICAL DATA:  Abdominal and flank pain with stone suspected EXAM: CT ABDOMEN AND PELVIS WITHOUT CONTRAST TECHNIQUE: Multidetector CT imaging of the abdomen and pelvis was performed following the standard protocol without IV contrast. RADIATION DOSE REDUCTION: This exam was performed according to the departmental dose-optimization program which includes automated exposure control, adjustment of the mA and/or kV according to patient size and/or use of iterative reconstruction technique. COMPARISON:  PET CT 03/29/2019 FINDINGS: Lower chest: Two indistinct nodular opacities in the right lower lobe measuring up to 13 mm on 2:10. More atelectatic or scar like density over the left diaphragm. Hepatobiliary: No focal liver abnormality.No evidence of biliary obstruction or stone. Pancreas: Unremarkable. Spleen: Unremarkable. Adrenals/Urinary Tract: Negative adrenals. No hydronephrosis or stone. Cystic densities in the bilateral lower kidney, unchanged from prior PET-CT and measuring up to 7.5 cm on the right. No imaging follow-up recommended. A nodule in the retroperitoneum of the low posterior pararenal space on the right is  unchanged since PET CT at 15 mm, non worrisome given multi year stability. Stable bladder which is only partially distended. Stomach/Bowel: Cluster of fluid containing bowel loops without over dilatation but notable very collapsed ileal loops distally. No clear transition point. No bowel wall thickening or perforation. The stomach is moderately distended. Vascular/Lymphatic: Atheromatous calcification which is generalized. No mass or adenopathy. Reproductive:Prostatectomy.  No nodularity seen at the surgical bed. Other: No ascites or pneumoperitoneum. Musculoskeletal: No acute abnormalities.  No sclerotic bone lesions. IMPRESSION: 1. Possible low-grade/partial small bowel obstruction - there is a cluster of fluid-filled loops and very decompressed distal small bowel. No obstructing lesion is seen. 2. Two indistinct nodules at the right lung base not seen by chest CT in 2021. These could be inflammatory but need close follow-up in the setting of prostate cancer history, recommend dedicated chest CT in 2-3 months. Electronically Signed   By: Jorje Guild M.D.   On: 02/12/2022 04:59    EKG: Independently reviewed.  NSR with rate 59; nonspecific ST changes with no evidence of acute ischemia   Labs on Admission: I have personally reviewed the available labs and imaging studies at the time of the admission.  Pertinent labs:    Glucose 126 Normal CBC UA WNL   Assessment and Plan: Principal Problem:   Abdominal pain Active Problems:   Pulmonary nodules/lesions, multiple    Abdominal pain -Patient presented with acute onset of abdominal pain, awakening him from sleep -Labs unremarkable -Imaging concerning for partial SBO -His pain has resolved, he now has bowel sounds, and he has had 2 BMs in the ER -NG tube was recommended but not placed and does not appear to be indicated -Patient was able to tolerate water earlier -I have asked that the patient be fed to ensure that he can tolerate PO -If  so, he is likely appropriate for dc to home with outpatient f/u  Pulmonary nodules -Present previously without obvious change He is recommended to have a dedicated chest CT in 2-3 months -This was discussed with the patient   There  are no additional recommendations at this time.  Thank you for the opportunity to do this telemedicine consult.      Author: Karmen Bongo, MD 02/12/2022 12:07 PM  For on call review [email or web address].

## 2022-02-12 NOTE — ED Notes (Signed)
Attempted NG tube x2 and pt unable to tolerate. Pt refusing NG tube at this time. Provider made aware. Pt does not need pain medicine at this time.

## 2022-02-12 NOTE — ED Notes (Signed)
Pt given medications, O2 sat dropped. Will place on 2L [place]

## 2022-03-14 ENCOUNTER — Encounter: Payer: Self-pay | Admitting: Physician Assistant

## 2022-04-15 ENCOUNTER — Encounter: Payer: Self-pay | Admitting: Physician Assistant

## 2022-04-15 ENCOUNTER — Ambulatory Visit: Payer: No Typology Code available for payment source | Admitting: Physician Assistant

## 2022-04-15 VITALS — BP 110/70 | HR 73 | Ht 67.0 in | Wt 234.4 lb

## 2022-04-15 DIAGNOSIS — R1012 Left upper quadrant pain: Secondary | ICD-10-CM

## 2022-04-15 DIAGNOSIS — R195 Other fecal abnormalities: Secondary | ICD-10-CM | POA: Diagnosis not present

## 2022-04-15 MED ORDER — NA SULFATE-K SULFATE-MG SULF 17.5-3.13-1.6 GM/177ML PO SOLN: 1.0000 | Freq: Once | ORAL | 0 refills | Status: AC

## 2022-04-15 NOTE — Progress Notes (Signed)
Attending Physician's Attestation   I have reviewed the chart.   I agree with the Advanced Practitioner's note, impression, and recommendations with any updates as below.    Sharlotte Baka Mansouraty, MD  Gastroenterology Advanced Endoscopy Office # [phone]  

## 2022-04-15 NOTE — Patient Instructions (Addendum)
_______________________________________________________  If your blood pressure at your visit was 140/90 or greater, please contact your primary care physician to follow up on this.  _______________________________________________________  If you are age 73 or older, your body mass index should be between 23-30. Your Body mass index is 36.71 kg/m. If this is out of the aforementioned range listed, please consider follow up with your Primary Care Provider.  If you are age 49 or younger, your body mass index should be between 19-25. Your Body mass index is 36.71 kg/m. If this is out of the aformentioned range listed, please consider follow up with your Primary Care Provider.   ________________________________________________________  The [facility] providers would like to encourage you to use [facility] to communicate with providers for non-urgent requests or questions.  Due to long hold times on the telephone, sending your provider a message by [facility] may be a faster and more efficient way to get a response.  Please allow 48 business hours for a response.  Please remember that this is for non-urgent requests.  _______________________________________________________  We will try to get records from [facility].  You have been scheduled for a colonoscopy. Please follow written instructions given to you at your visit today.  Please pick up your prep supplies at the pharmacy within the next 1-3 days. If you use inhalers (even only as needed), please bring them with you on the day of your procedure.  We have sent the following medications to your pharmacy for you to pick up at your convenience: Suprep  It was a pleasure to see you today!  Thank you for trusting me with your gastrointestinal care!

## 2022-04-15 NOTE — Progress Notes (Addendum)
Subjective:    Patient ID: Curtis Bell, male    DOB: Aug 15, 1950, 72 y.o.   MRN: [id number]  HPI Curtis Bell is a 72 year old African-American male, new to GI today referred by the [facility] with positive FIT test. Patient says he has not noticed any blood in his stool, no melena or hematochezia.  He relates that he has had prior colonoscopies he believes through [facility] after conversation and may have history of polyps.  He does not recall when the last colonoscopy was done but may have been for 5 years ago.  He has no current complaints of abdominal pain does have some mild constipation off and on.  He did have an ER visit with what he describes as severe right-sided abdominal pain in mid November 2023 after he had eaten a peanut butter and banana sandwich and had a protein shake. ET of the abdomen pelvis was done by stone protocol and showed 2 nodular opacities in the right lower lobe measuring up to 13 mm.  Cystic densities in the bilateral lower kidneys unchanged from prior PET scan measuring up to 7.5 cm on the right, and found to have a cluster of fluid containing bowel loops without overt dilation but noted to be very collapsed ileal loops distally no clear transition point no bowel wall thickening or perforation this was felt consistent with possible low-grade or partial small bowel obstruction.  He was actually not admitted, as symptoms improved significantly while he was in the emergency room he was able to tolerate p.o. and was allowed discharge. It was advised that he have follow-up CT of the chest in 3 months.  He says he has not had any recurrence of that episode.  He has noted some left lower abdominal pain intermittently over the past few months.  He says usually only notices if somebody presses on his lower abdomen or if his grandchild sits on his lower abdomen.  He is not having this discomfort today.  He also occasionally will get cramping pain in his pelvis prior to a bowel  movement which resolves with a bowel movement. He does have history of prostate cancer stage IIb and is status post prostatectomy, had a prior hernia repair with mesh, history of hypertension, LPR. Does not have any history of sleep apnea or coronary artery disease to his knowledge.  Labs from November 2023 hemoglobin 14.9/hematocrit 44.9/MCV 88 BUN 18/creatinine 0.74   Review of Systems.Pertinent positive and negative review of systems were noted in the above HPI section.  All other review of systems was otherwise negative.   Outpatient Encounter Medications as of 04/15/2022  Medication Sig   acetaminophen (TYLENOL ARTHRITIS PAIN) 650 MG CR tablet Per bottle as needed   albuterol (VENTOLIN HFA) 108 (90 Base) MCG/ACT inhaler Inhale into the lungs every 6 (six) hours as needed for wheezing or shortness of breath. Patient has run out of this medication   amLODipine (NORVASC) 10 MG tablet Take 0.5 tablets by mouth every morning.    Ascorbic Acid (VITAMIN C) 1000 MG tablet Take 1,000 mg by mouth daily. Plus Vitamin E   aspirin 81 MG tablet Take 81 mg by mouth daily.     Boswellia-Glucosamine-Vit D (OSTEO BI-FLEX ONE PER DAY PO) Take by mouth.   budesonide-formoterol (SYMBICORT) 160-4.5 MCG/ACT inhaler Inhale 2 puffs into the lungs 2 (two) times daily.   buPROPion (WELLBUTRIN SR) 100 MG 12 hr tablet Take 100 mg by mouth daily.   carvedilol (COREG) 12.5 MG tablet  12.5 mg. twice daily   cetirizine (ZYRTEC) 10 MG tablet Take 10 mg by mouth daily.   Cholecalciferol (VITAMIN D3) 1.25 MG (50000 UT) CAPS TK 1 C PO 1 TIME A WK   fluticasone (FLONASE) 50 MCG/ACT nasal spray Place 2 sprays into both nostrils daily.   furosemide (LASIX) 40 MG tablet Take 40 mg by mouth.   Ginger, Zingiber officinalis, (GINGER PO) Take by mouth.   ibuprofen (ADVIL,MOTRIN) 600 MG tablet Take by mouth.   losartan (COZAAR) 50 MG tablet Take 50 mg by mouth daily.   memantine (NAMENDA) 10 MG tablet Take 1 tablet (10 mg total)  by mouth 2 (two) times daily.   montelukast (SINGULAIR) 10 MG tablet Take 10 mg by mouth daily.   Multiple Vitamin (MULTIVITAMIN) tablet Take 1 tablet by mouth daily.   Na Sulfate-K Sulfate-Mg Sulf 17.5-3.13-1.6 GM/177ML SOLN Take 1 kit by mouth once for 1 dose.   Naproxen Sodium (ALEVE) 220 MG CAPS Take 1 capsule by mouth 2 (two) times daily.   nitroGLYCERIN (NITROSTAT) 0.4 MG SL tablet 1 under tongue every 5 mins x3 if needed for chest pain   ondansetron (ZOFRAN-ODT) 4 MG disintegrating tablet Take 1 tablet (4 mg total) by mouth every 8 (eight) hours as needed for nausea or vomiting.   Probiotic Product (CULTURELLE PROBIOTICS PO) Take by mouth.   Respiratory Therapy Supplies (FLUTTER) DEVI Use as directed.   tadalafil (CIALIS) 5 MG tablet Take 5 mg by mouth as needed.    tamsulosin (FLOMAX) 0.4 MG CAPS capsule Take 0.4 mg by mouth daily. Not taking. Ran out of the medication.   Turmeric (QC TUMERIC COMPLEX PO) Take by mouth.   No facility-administered encounter medications on file as of 04/15/2022.   No Known Allergies Patient Active Problem List   Diagnosis Date Noted   Abdominal pain 02/12/2022   Pulmonary nodules/lesions, multiple 02/12/2022   Biochemically recurrent malignant neoplasm of prostate ([place]) 04/20/2019   Laryngopharyngeal reflux (LPR) 01/24/2019   Prostate cancer 07/27/2018   S/P arthroscopy of right shoulder 12/16/2017   Other emphysema 08/31/2017   Referred otalgia of left ear 07/24/2017   Deviated nasal septum 06/04/2017   Chronic pansinusitis 04/13/2017   Impingement syndrome of right shoulder 10/25/2016   Rotator cuff syndrome of right shoulder 07/31/2016   Closed anterior dislocation of right shoulder 05/29/2016   Acute sinusitis 04/18/2015   Chronic cough 06/03/2013   Hypertension 01/12/2011   Social History   Socioeconomic History   Marital status: Married    Spouse name: Curtis Bell   Number of children: 2   Years of education: 12   Highest education  level: High school graduate  Occupational History    Comment: retired  Tobacco Use   Smoking status: Former    Packs/day: 0.75    Years: 20.00    Total pack years: 15.00    Types: Cigarettes    Quit date: 03/31/1982    Years since quitting: 40.0   Smokeless tobacco: Never  Vaping Use   Vaping Use: Never used  Substance and Sexual Activity   Alcohol use: No   Drug use: No   Sexual activity: Not Currently  Other Topics Concern   Not on file  Social History Narrative   Left handed      Highest level of edu- HS      One story home with wife and son   Social Determinants of Health   Financial Resource Strain: Not on file  Food Insecurity: Not on  file  Transportation Needs: Not on file  Physical Activity: Not on file  Stress: Not on file  Social Connections: Not on file  Intimate Partner Violence: Not on file    Mr. Mcginley family history includes Cataracts in his father; Lung disease in his father; Memory loss in his mother; Prostate cancer in his father.      Objective:    Vitals:   04/15/22 1408  BP: 110/70  Pulse: 73    Physical Exam Well-developed well-nourished older AA male in no acute distress.  Height, Weight, 234 BMI 36.1  HEENT; nontraumatic normocephalic, EOMI, PE R LA, sclera anicteric. Oropharynx;not examined today  Neck; supple, no JVD Cardiovascular; regular rate and rhythm with S1-S2, no murmur rub or gallop Pulmonary; Clear bilaterally Abdomen; soft,obese, nontender, nondistended, no palpable mass or hepatosplenomegaly, bowel sounds are active, low midline incisional scar Rectal; not done today Skin; benign exam, no jaundice rash or appreciable lesions Extremities; no clubbing cyanosis or edema skin warm and dry Neuro/Psych; alert and oriented x4, grossly nonfocal mood and affect appropriate        Assessment & Plan:   #61 72 year old African-American male with a positive fit test, referred by the [facility]. Has had prior  colonoscopies, he believes the last was done through [facility] is not certain of the date or findings but thinks he may have history of polyps.  Patient unaware of any melena or hematochezia. Rule out occult neoplasm.,  Adenomatous colon polyps  #2 recent ER visit November 2023 with episode of acute abdominal pain. CT imaging showed possible very low-grade partial small bowel obstruction no clear transition point.  Patient did not require admission as symptoms resolved he was in the emergency room.  He has not had any recurrence of that pain. Suspect this may have been secondary to adhesions as he has had prior prostatectomy with anterior approach and hernia repair with mesh.  #3 intermittent left lower abdominal pain over the past couple of months.  Etiology not clear no findings on CT imaging to explain left lower abdominal pain.  Patient generally only notices this with pressure on the abdomen  #4 pulmonary nodules on recent CT recommendation was for dedicated chest CT in 3 months #5 hypertension 6.  LPR 7.  Memory loss on [place]; Patient will be scheduled for colonoscopy with Dr. Rush Landmark.  Procedure was discussed in detail with the patient including indications risk and benefits and he is agreeable to proceed. Patient has  signed a release and will obtain copies of his prior colonoscopies and path reports from [facility]. If colonoscopy is negative and he is continuing to complain of left-sided abdominal pain would proceed with repeat CT imaging abdomen and pelvis with contrast (prior imaging in November was done with stone protocol)  I advised him to make a follow-up appointment with his primary care physician Dr. Marlou Sa to be scheduled for chest CT for follow-up of the pulmonary nodules.   Addendum-records received from [facility]/Dr. Watt Climes, last colonoscopy April 2016 showing a few diverticuli in the sigmoid and descending colon, external and internal hemorrhoids, no polyps, prep  adequate-recommended 5-year interval follow-up due to prior history of polyps   Tanner Yeley Genia Harold PA-C 04/15/2022   Cc: Rogers Blocker, MD

## 2022-04-18 ENCOUNTER — Other Ambulatory Visit: Payer: Self-pay | Admitting: Internal Medicine

## 2022-04-18 ENCOUNTER — Ambulatory Visit
Admission: RE | Admit: 2022-04-18 | Discharge: 2022-04-18 | Disposition: A | Discharge status: Home / Self Care | Payer: Medicare HMO | Source: Ambulatory Visit | Attending: Internal Medicine | Admitting: Internal Medicine

## 2022-04-18 ENCOUNTER — Other Ambulatory Visit: Payer: Self-pay

## 2022-04-18 ENCOUNTER — Encounter (HOSPITAL_COMMUNITY): Payer: Self-pay

## 2022-04-18 ENCOUNTER — Inpatient Hospital Stay (HOSPITAL_COMMUNITY)
Admission: EM | Admit: 2022-04-18 | Discharge: 2022-04-21 | DRG: 175 | Disposition: A | Discharge status: Home / Self Care | Payer: No Typology Code available for payment source | Attending: Internal Medicine | Admitting: Internal Medicine

## 2022-04-18 DIAGNOSIS — R059 Cough, unspecified: Secondary | ICD-10-CM

## 2022-04-18 DIAGNOSIS — E669 Obesity, unspecified: Secondary | ICD-10-CM | POA: Diagnosis present

## 2022-04-18 DIAGNOSIS — Z87891 Personal history of nicotine dependence: Secondary | ICD-10-CM

## 2022-04-18 DIAGNOSIS — J449 Chronic obstructive pulmonary disease, unspecified: Secondary | ICD-10-CM | POA: Diagnosis present

## 2022-04-18 DIAGNOSIS — I10 Essential (primary) hypertension: Secondary | ICD-10-CM | POA: Diagnosis present

## 2022-04-18 DIAGNOSIS — J438 Other emphysema: Secondary | ICD-10-CM | POA: Diagnosis present

## 2022-04-18 DIAGNOSIS — I2699 Other pulmonary embolism without acute cor pulmonale: Principal | ICD-10-CM | POA: Diagnosis present

## 2022-04-18 DIAGNOSIS — Z79899 Other long term (current) drug therapy: Secondary | ICD-10-CM

## 2022-04-18 DIAGNOSIS — Z6836 Body mass index (BMI) 36.0-36.9, adult: Secondary | ICD-10-CM

## 2022-04-18 DIAGNOSIS — R079 Chest pain, unspecified: Secondary | ICD-10-CM

## 2022-04-18 DIAGNOSIS — M810 Age-related osteoporosis without current pathological fracture: Secondary | ICD-10-CM | POA: Diagnosis present

## 2022-04-18 DIAGNOSIS — Z7982 Long term (current) use of aspirin: Secondary | ICD-10-CM

## 2022-04-18 DIAGNOSIS — J9811 Atelectasis: Secondary | ICD-10-CM | POA: Diagnosis present

## 2022-04-18 DIAGNOSIS — I5033 Acute on chronic diastolic (congestive) heart failure: Secondary | ICD-10-CM | POA: Diagnosis present

## 2022-04-18 DIAGNOSIS — I509 Heart failure, unspecified: Secondary | ICD-10-CM

## 2022-04-18 DIAGNOSIS — Z7951 Long term (current) use of inhaled steroids: Secondary | ICD-10-CM

## 2022-04-18 DIAGNOSIS — K219 Gastro-esophageal reflux disease without esophagitis: Secondary | ICD-10-CM | POA: Diagnosis present

## 2022-04-18 DIAGNOSIS — R918 Other nonspecific abnormal finding of lung field: Secondary | ICD-10-CM | POA: Diagnosis present

## 2022-04-18 DIAGNOSIS — I110 Hypertensive heart disease with heart failure: Secondary | ICD-10-CM | POA: Diagnosis present

## 2022-04-18 DIAGNOSIS — R0781 Pleurodynia: Secondary | ICD-10-CM | POA: Diagnosis present

## 2022-04-18 DIAGNOSIS — Z1152 Encounter for screening for COVID-19: Secondary | ICD-10-CM

## 2022-04-18 DIAGNOSIS — Z8042 Family history of malignant neoplasm of prostate: Secondary | ICD-10-CM

## 2022-04-18 DIAGNOSIS — J324 Chronic pansinusitis: Secondary | ICD-10-CM | POA: Diagnosis present

## 2022-04-18 DIAGNOSIS — Z8546 Personal history of malignant neoplasm of prostate: Secondary | ICD-10-CM

## 2022-04-18 LAB — COMPREHENSIVE METABOLIC PANEL
ALT: 19 U/L (ref 0–44)
AST: 17 U/L (ref 15–41)
Albumin: 3.8 g/dL (ref 3.5–5.0)
Alkaline Phosphatase: 86 U/L (ref 38–126)
Anion gap: 9 (ref 5–15)
BUN: 8 mg/dL (ref 8–23)
CO2: 26 mmol/L (ref 22–32)
Calcium: 8.7 mg/dL — ABNORMAL LOW (ref 8.9–10.3)
Chloride: 103 mmol/L (ref 98–111)
Creatinine, Ser: 0.87 mg/dL (ref 0.61–1.24)
GFR, Estimated: >60 mL/min (ref >60)
Glucose, Bld: 104 mg/dL — ABNORMAL HIGH (ref 70–99)
Potassium: 4.1 mmol/L (ref 3.5–5.1)
Sodium: 138 mmol/L (ref 135–145)
Total Bilirubin: 0.6 mg/dL (ref 0.3–1.2)
Total Protein: 7.5 g/dL (ref 6.5–8.1)

## 2022-04-18 LAB — CBC WITH DIFFERENTIAL/PLATELET
Abs Immature Granulocytes: 0.04 10*3/uL (ref 0.00–0.07)
Basophils Absolute: 0 10*3/uL (ref 0.0–0.1)
Basophils Relative: 0 %
Eosinophils Absolute: 0.3 10*3/uL (ref 0.0–0.5)
Eosinophils Relative: 4 %
HCT: 47.6 % (ref 39.0–52.0)
Hemoglobin: 15.5 g/dL (ref 13.0–17.0)
Immature Granulocytes: 1 %
Lymphocytes Relative: 10 %
Lymphs Abs: 0.8 10*3/uL (ref 0.7–4.0)
MCH: 29.5 pg (ref 26.0–34.0)
MCHC: 32.6 g/dL (ref 30.0–36.0)
MCV: 90.7 fL (ref 80.0–100.0)
Monocytes Absolute: 0.8 10*3/uL (ref 0.1–1.0)
Monocytes Relative: 11 %
Neutro Abs: 5.8 10*3/uL (ref 1.7–7.7)
Neutrophils Relative %: 74 %
Platelets: 253 10*3/uL (ref 150–400)
RBC: 5.25 MIL/uL (ref 4.22–5.81)
RDW: 12.8 % (ref 11.5–15.5)
WBC: 7.9 10*3/uL (ref 4.0–10.5)
nRBC: 0 % (ref 0.0–0.2)

## 2022-04-18 LAB — RESP PANEL BY RT-PCR (RSV, FLU A&B, COVID)  RVPGX2
Influenza A by PCR: NEGATIVE
Influenza B by PCR: NEGATIVE
Resp Syncytial Virus by PCR: NEGATIVE
SARS Coronavirus 2 by RT PCR: NEGATIVE

## 2022-04-18 MED ORDER — HYDROCODONE-ACETAMINOPHEN 5-325 MG PO TABS
1.0000 | ORAL_TABLET | Freq: Once | ORAL | Status: AC
  Administered 2022-04-18: 1 via ORAL
  Filled 2022-04-18: qty 1

## 2022-04-18 NOTE — ED Triage Notes (Signed)
Pt sent by PCP for a confirmed bilateral PE. Pt c/o L side CP with associated ShOB x 3 days. Pain is worse with deep inspiration.

## 2022-04-18 NOTE — ED Provider Triage Note (Addendum)
Emergency Medicine Provider Triage Evaluation Note  Curtis Bell , a 72 y.o. male  was evaluated in triage.  Pt complains of shortness of breath, left-sided chest pain.  Patient states that symptoms been present and worsening over the past 3 to 4 days.  Has been seen by primary care doctor provider outpatient who is done workup with evidence of PE.  I spoke with the primary care provider independently he was working on faxing over imaging so can be observed.  Patient reports left-sided chest pain worsened with taking a deep breath as well as with certain movements.  Denies history of DVT/PE, current anticoagulation, recent surgery/immobilization, known malignancy.  Denies fever, chills, night sweats, abdominal pain, nausea, vomiting, urinary symptoms, change in bowel habits.  Review of Systems  Positive: See above Negative:   Physical Exam  BP (!) 154/83   Pulse 84   Temp (!) 97.4 F (36.3 C)   Resp (!) 22   Ht '5\' 7"'$  (1.702 m)   Wt 104.3 kg   SpO2 92%   BMI 36.02 kg/m  Gen:   Awake, no distress   Resp:  Normal effort  MSK:   Moves extremities without difficulty  Other:    Medical Decision Making  Medically screening exam initiated at 7:53 PM.  Appropriate orders placed.  Duane Lope Calvo was informed that the remainder of the evaluation will be completed by another provider, this initial triage assessment does not replace that evaluation, and the importance of remaining in the ED until their evaluation is complete.     Wilnette Kales, [place] 04/18/22 1955

## 2022-04-19 ENCOUNTER — Inpatient Hospital Stay (HOSPITAL_COMMUNITY): Payer: No Typology Code available for payment source

## 2022-04-19 ENCOUNTER — Emergency Department (HOSPITAL_COMMUNITY): Payer: No Typology Code available for payment source

## 2022-04-19 DIAGNOSIS — J324 Chronic pansinusitis: Secondary | ICD-10-CM | POA: Diagnosis present

## 2022-04-19 DIAGNOSIS — R0781 Pleurodynia: Secondary | ICD-10-CM | POA: Diagnosis present

## 2022-04-19 DIAGNOSIS — I2699 Other pulmonary embolism without acute cor pulmonale: Secondary | ICD-10-CM | POA: Diagnosis present

## 2022-04-19 DIAGNOSIS — Z1152 Encounter for screening for COVID-19: Secondary | ICD-10-CM | POA: Diagnosis not present

## 2022-04-19 DIAGNOSIS — Z87891 Personal history of nicotine dependence: Secondary | ICD-10-CM | POA: Diagnosis not present

## 2022-04-19 DIAGNOSIS — J438 Other emphysema: Secondary | ICD-10-CM | POA: Diagnosis present

## 2022-04-19 DIAGNOSIS — Z79899 Other long term (current) drug therapy: Secondary | ICD-10-CM | POA: Diagnosis not present

## 2022-04-19 DIAGNOSIS — J9811 Atelectasis: Secondary | ICD-10-CM | POA: Diagnosis present

## 2022-04-19 DIAGNOSIS — M810 Age-related osteoporosis without current pathological fracture: Secondary | ICD-10-CM | POA: Diagnosis present

## 2022-04-19 DIAGNOSIS — I509 Heart failure, unspecified: Secondary | ICD-10-CM

## 2022-04-19 DIAGNOSIS — Z7951 Long term (current) use of inhaled steroids: Secondary | ICD-10-CM | POA: Diagnosis not present

## 2022-04-19 DIAGNOSIS — K219 Gastro-esophageal reflux disease without esophagitis: Secondary | ICD-10-CM | POA: Diagnosis present

## 2022-04-19 DIAGNOSIS — R918 Other nonspecific abnormal finding of lung field: Secondary | ICD-10-CM | POA: Diagnosis present

## 2022-04-19 DIAGNOSIS — Z8546 Personal history of malignant neoplasm of prostate: Secondary | ICD-10-CM | POA: Diagnosis not present

## 2022-04-19 DIAGNOSIS — Z8042 Family history of malignant neoplasm of prostate: Secondary | ICD-10-CM | POA: Diagnosis not present

## 2022-04-19 DIAGNOSIS — E669 Obesity, unspecified: Secondary | ICD-10-CM | POA: Diagnosis present

## 2022-04-19 DIAGNOSIS — I5033 Acute on chronic diastolic (congestive) heart failure: Secondary | ICD-10-CM | POA: Diagnosis present

## 2022-04-19 DIAGNOSIS — Z7982 Long term (current) use of aspirin: Secondary | ICD-10-CM | POA: Diagnosis not present

## 2022-04-19 DIAGNOSIS — I110 Hypertensive heart disease with heart failure: Secondary | ICD-10-CM | POA: Diagnosis present

## 2022-04-19 DIAGNOSIS — Z6836 Body mass index (BMI) 36.0-36.9, adult: Secondary | ICD-10-CM | POA: Diagnosis not present

## 2022-04-19 LAB — CBC
HCT: 44.4 % (ref 39.0–52.0)
Hemoglobin: 14.6 g/dL (ref 13.0–17.0)
MCH: 29.9 pg (ref 26.0–34.0)
MCHC: 32.9 g/dL (ref 30.0–36.0)
MCV: 91 fL (ref 80.0–100.0)
Platelets: 241 10*3/uL (ref 150–400)
RBC: 4.88 MIL/uL (ref 4.22–5.81)
RDW: 12.8 % (ref 11.5–15.5)
WBC: 6.3 10*3/uL (ref 4.0–10.5)
nRBC: 0 % (ref 0.0–0.2)

## 2022-04-19 LAB — TROPONIN I (HIGH SENSITIVITY): Troponin I (High Sensitivity): 7 ng/L (ref <18)

## 2022-04-19 LAB — HEPARIN LEVEL (UNFRACTIONATED)
Heparin Unfractionated: 0.59 IU/mL (ref 0.30–0.70)
Heparin Unfractionated: 0.51 IU/mL (ref 0.30–0.70)

## 2022-04-19 MED ORDER — ONDANSETRON HCL 4 MG/2ML IJ SOLN: 4.0000 mg | Freq: Four times a day (QID) | INTRAMUSCULAR | Status: DC | PRN

## 2022-04-19 MED ORDER — ACETAMINOPHEN 325 MG PO TABS: 650.0000 mg | ORAL_TABLET | Freq: Four times a day (QID) | ORAL | Status: DC | PRN

## 2022-04-19 MED ORDER — FAMOTIDINE 20 MG PO TABS
20.0000 mg | ORAL_TABLET | Freq: Every day | ORAL | Status: DC
  Administered 2022-04-19 – 2022-04-21 (×3): 20 mg via ORAL
  Filled 2022-04-19 (×3): qty 1

## 2022-04-19 MED ORDER — HEPARIN (PORCINE) 25000 UT/250ML-% IV SOLN
1600.0000 [IU]/h | INTRAVENOUS | Status: DC
  Administered 2022-04-19 – 2022-04-20 (×3): 1600 [IU]/h via INTRAVENOUS
  Filled 2022-04-19 (×3): qty 250

## 2022-04-19 MED ORDER — FUROSEMIDE 10 MG/ML IJ SOLN
40.0000 mg | Freq: Two times a day (BID) | INTRAMUSCULAR | Status: AC
  Administered 2022-04-19 – 2022-04-20 (×2): 40 mg via INTRAVENOUS
  Filled 2022-04-19 (×2): qty 4

## 2022-04-19 MED ORDER — MOMETASONE FURO-FORMOTEROL FUM 200-5 MCG/ACT IN AERO: 2.0000 | INHALATION_SPRAY | Freq: Two times a day (BID) | RESPIRATORY_TRACT | Status: DC

## 2022-04-19 MED ORDER — MOMETASONE FURO-FORMOTEROL FUM 200-5 MCG/ACT IN AERO
2.0000 | INHALATION_SPRAY | Freq: Two times a day (BID) | RESPIRATORY_TRACT | Status: DC
  Administered 2022-04-20 – 2022-04-21 (×2): 2 via RESPIRATORY_TRACT
  Filled 2022-04-19 (×2): qty 8.8

## 2022-04-19 MED ORDER — GUAIFENESIN 100 MG/5ML PO LIQD: 5.0000 mL | ORAL | Status: DC | PRN

## 2022-04-19 MED ORDER — MEMANTINE HCL 10 MG PO TABS
10.0000 mg | ORAL_TABLET | Freq: Two times a day (BID) | ORAL | Status: DC
  Administered 2022-04-19 – 2022-04-21 (×5): 10 mg via ORAL
  Filled 2022-04-19 (×5): qty 1

## 2022-04-19 MED ORDER — TAMSULOSIN HCL 0.4 MG PO CAPS
0.4000 mg | ORAL_CAPSULE | Freq: Every day | ORAL | Status: DC
  Administered 2022-04-19 – 2022-04-21 (×3): 0.4 mg via ORAL
  Filled 2022-04-19 (×3): qty 1

## 2022-04-19 MED ORDER — IPRATROPIUM-ALBUTEROL 0.5-2.5 (3) MG/3ML IN SOLN: 3.0000 mL | RESPIRATORY_TRACT | Status: DC | PRN

## 2022-04-19 MED ORDER — ROSUVASTATIN CALCIUM 5 MG PO TABS
10.0000 mg | ORAL_TABLET | Freq: Every day | ORAL | Status: DC
  Administered 2022-04-19 – 2022-04-21 (×3): 10 mg via ORAL
  Filled 2022-04-19 (×3): qty 2

## 2022-04-19 MED ORDER — MONTELUKAST SODIUM 10 MG PO TABS
10.0000 mg | ORAL_TABLET | Freq: Every day | ORAL | Status: DC
  Administered 2022-04-19 – 2022-04-21 (×3): 10 mg via ORAL
  Filled 2022-04-19 (×3): qty 1

## 2022-04-19 MED ORDER — POTASSIUM CHLORIDE CRYS ER 20 MEQ PO TBCR
40.0000 meq | EXTENDED_RELEASE_TABLET | Freq: Once | ORAL | Status: AC
  Administered 2022-04-19: 40 meq via ORAL
  Filled 2022-04-19: qty 2

## 2022-04-19 MED ORDER — ERYTHROMYCIN 5 MG/GM OP OINT
TOPICAL_OINTMENT | Freq: Two times a day (BID) | OPHTHALMIC | Status: DC
  Administered 2022-04-21: 1 via OPHTHALMIC
  Filled 2022-04-19: qty 3.5

## 2022-04-19 MED ORDER — HYDROMORPHONE HCL 1 MG/ML IJ SOLN
0.5000 mg | INTRAMUSCULAR | Status: DC | PRN
  Administered 2022-04-19 – 2022-04-20 (×3): 0.5 mg via INTRAVENOUS
  Filled 2022-04-19 (×3): qty 1

## 2022-04-19 MED ORDER — HYDROCODONE-ACETAMINOPHEN 5-325 MG PO TABS
1.0000 | ORAL_TABLET | Freq: Four times a day (QID) | ORAL | Status: DC | PRN
  Administered 2022-04-19 – 2022-04-21 (×4): 1 via ORAL
  Filled 2022-04-19 (×4): qty 1

## 2022-04-19 MED ORDER — IOHEXOL 350 MG/ML SOLN
75.0000 mL | Freq: Once | INTRAVENOUS | Status: AC | PRN
  Administered 2022-04-19: 75 mL via INTRAVENOUS

## 2022-04-19 MED ORDER — LORATADINE 10 MG PO TABS
10.0000 mg | ORAL_TABLET | Freq: Every day | ORAL | Status: DC
  Administered 2022-04-19 – 2022-04-21 (×3): 10 mg via ORAL
  Filled 2022-04-19 (×3): qty 1

## 2022-04-19 MED ORDER — MORPHINE SULFATE (PF) 4 MG/ML IV SOLN
4.0000 mg | Freq: Once | INTRAVENOUS | Status: AC
  Administered 2022-04-19: 4 mg via INTRAVENOUS
  Filled 2022-04-19: qty 1

## 2022-04-19 MED ORDER — HEPARIN BOLUS VIA INFUSION
6000.0000 [IU] | Freq: Once | INTRAVENOUS | Status: AC
  Administered 2022-04-19: 6000 [IU] via INTRAVENOUS
  Filled 2022-04-19: qty 6000

## 2022-04-19 MED ORDER — PANTOPRAZOLE SODIUM 40 MG PO TBEC
40.0000 mg | DELAYED_RELEASE_TABLET | Freq: Every day | ORAL | Status: DC
  Administered 2022-04-19 – 2022-04-21 (×3): 40 mg via ORAL
  Filled 2022-04-19 (×3): qty 1

## 2022-04-19 MED ORDER — ALBUTEROL SULFATE HFA 108 (90 BASE) MCG/ACT IN AERS: 1.0000 | INHALATION_SPRAY | Freq: Four times a day (QID) | RESPIRATORY_TRACT | Status: DC | PRN

## 2022-04-19 MED ORDER — GUAIFENESIN 100 MG/5ML PO LIQD
5.0000 mL | ORAL | Status: DC | PRN
  Administered 2022-04-19 – 2022-04-21 (×2): 5 mL via ORAL
  Filled 2022-04-19 (×2): qty 10

## 2022-04-19 MED ORDER — CARVEDILOL 12.5 MG PO TABS
12.5000 mg | ORAL_TABLET | Freq: Two times a day (BID) | ORAL | Status: DC
  Administered 2022-04-19 – 2022-04-21 (×5): 12.5 mg via ORAL
  Filled 2022-04-19 (×5): qty 1

## 2022-04-19 MED ORDER — ONDANSETRON HCL 4 MG/2ML IJ SOLN
4.0000 mg | Freq: Once | INTRAMUSCULAR | Status: AC
  Administered 2022-04-19: 4 mg via INTRAVENOUS
  Filled 2022-04-19: qty 2

## 2022-04-19 NOTE — ED Notes (Signed)
Patient transported to CT 

## 2022-04-19 NOTE — Progress Notes (Signed)
  Echocardiogram 2D Echocardiogram has been performed.  Curtis Bell 04/19/2022, 5:56 PM

## 2022-04-19 NOTE — ED Notes (Signed)
ED TO INPATIENT HANDOFF REPORT  ED Nurse Name and Phone #:  Nicki Reaper [phone]  S Name/Age/Gender Curtis Bell 72 y.o. male Room/Bed: 045C/045C  Code Status   Code Status: Not on file  Home/SNF/Other Home Patient oriented to: self, place, time, and situation Is this baseline? Yes   Triage Complete: Triage complete  Chief Complaint Acute pulmonary embolism ([place]) [I26.99] Pulmonary embolism ([place]) [I26.99]  Triage Note Pt sent by PCP for a confirmed bilateral PE. Pt c/o L side CP with associated ShOB x 3 days. Pain is worse with deep inspiration.    Allergies No Known Allergies  Level of Care/Admitting Diagnosis ED Disposition     ED Disposition  Admit   Condition  --   Comment  Hospital Area: [facility] [[id number]]  Level of Care: Telemetry Cardiac [[id number]]  May admit patient to [facility] or [facility] if equivalent level of care is available:: No  Covid Evaluation: Asymptomatic - no recent exposure (last 10 days) testing not required  Diagnosis: Pulmonary embolism [facility]) [[id number]]  Admitting Physician: Domenic Polite [[id number]]  Attending Physician: Domenic Polite [[id number]]  Certification:: I certify this patient will need inpatient services for at least 2 midnights  Estimated Length of Stay: 2          B Medical/Surgery History Past Medical History:  Diagnosis Date   Biochemically recurrent malignant neoplasm of prostate ([place]) 04/20/2019   Chronic fatigue syndrome    Chronic pansinusitis 04/13/2017   COPD (chronic obstructive pulmonary disease) ([place])    Deviated nasal septum 06/04/2017   Hypertension    Laryngopharyngeal reflux (LPR) 01/24/2019   Osteoporosis    Other emphysema 08/31/2017   Prostate cancer    Past Surgical History:  Procedure Laterality Date   ANTERIOR CRUCIATE LIGAMENT REPAIR     CATARACT EXTRACTION     HERNIA REPAIR     KNEE SURGERY     PROSTATE SURGERY       A IV Location/Drains/Wounds Patient  Lines/Drains/Airways Status     Active Line/Drains/Airways     Name Placement date Placement time Site Days   Peripheral IV 04/19/22 20 G Right Antecubital 04/19/22  0109  Antecubital  less than 1            Intake/Output Last 24 hours No intake or output data in the 24 hours ending 04/19/22 1417  Labs/Imaging Results for orders placed or performed during the hospital encounter of 04/18/22 (from the past 48 hour(s))  Resp panel by RT-PCR (RSV, Flu A&B, Covid) Anterior Nasal Swab     Status: None   Collection Time: 04/18/22  3:58 PM   Specimen: Anterior Nasal Swab  Result Value Ref Range   SARS Coronavirus 2 by RT PCR NEGATIVE NEGATIVE    Comment: (NOTE) SARS-CoV-2 target nucleic acids are NOT DETECTED.  The SARS-CoV-2 RNA is generally detectable in upper respiratory specimens during the acute phase of infection. The lowest concentration of SARS-CoV-2 viral copies this assay can detect is 138 copies/mL. A negative result does not preclude SARS-Cov-2 infection and should not be used as the sole basis for treatment or other patient management decisions. A negative result may occur with  improper specimen collection/handling, submission of specimen other than nasopharyngeal swab, presence of viral mutation(s) within the areas targeted by this assay, and inadequate number of viral copies(<138 copies/mL). A negative result must be combined with clinical observations, patient history, and epidemiological information. The expected result is Negative.  Fact Sheet for Patients:  EntrepreneurPulse.com.au  Fact Sheet for Healthcare Providers:  IncredibleEmployment.be  This test is no t yet approved or cleared by the [place] FDA and  has been authorized for detection and/or diagnosis of SARS-CoV-2 by FDA under an Emergency Use Authorization (EUA). This EUA will remain  in effect (meaning this test can be used) for the duration of  the COVID-19 declaration under Section 564(b)(1) of the Act, 21 U.S.C.section 360bbb-3(b)(1), unless the authorization is terminated  or revoked sooner.       Influenza A by PCR NEGATIVE NEGATIVE   Influenza B by PCR NEGATIVE NEGATIVE    Comment: (NOTE) The Xpert Xpress SARS-CoV-2/FLU/RSV plus assay is intended as an aid in the diagnosis of influenza from Nasopharyngeal swab specimens and should not be used as a sole basis for treatment. Nasal washings and aspirates are unacceptable for Xpert Xpress SARS-CoV-2/FLU/RSV testing.  Fact Sheet for Patients: EntrepreneurPulse.com.au  Fact Sheet for Healthcare Providers: IncredibleEmployment.be  This test is not yet approved or cleared by the [place] FDA and has been authorized for detection and/or diagnosis of SARS-CoV-2 by FDA under an Emergency Use Authorization (EUA). This EUA will remain in effect (meaning this test can be used) for the duration of the COVID-19 declaration under Section 564(b)(1) of the Act, 21 U.S.C. section 360bbb-3(b)(1), unless the authorization is terminated or revoked.     Resp Syncytial Virus by PCR NEGATIVE NEGATIVE    Comment: (NOTE) Fact Sheet for Patients: EntrepreneurPulse.com.au  Fact Sheet for Healthcare Providers: IncredibleEmployment.be  This test is not yet approved or cleared by the [place] FDA and has been authorized for detection and/or diagnosis of SARS-CoV-2 by FDA under an Emergency Use Authorization (EUA). This EUA will remain in effect (meaning this test can be used) for the duration of the COVID-19 declaration under Section 564(b)(1) of the Act, 21 U.S.C. section 360bbb-3(b)(1), unless the authorization is terminated or revoked.  Performed at [facility], [place] [address], [place], [place] [address]   Comprehensive metabolic panel     Status: Abnormal   Collection Time: 04/18/22  8:00 PM   Result Value Ref Range   Sodium 138 135 - 145 mmol/L   Potassium 4.1 3.5 - 5.1 mmol/L   Chloride 103 98 - 111 mmol/L   CO2 26 22 - 32 mmol/L   Glucose, Bld 104 (H) 70 - 99 mg/dL    Comment: Glucose reference range applies only to samples taken after fasting for at least 8 hours.   BUN 8 8 - 23 mg/dL   Creatinine, Ser 0.87 0.61 - 1.24 mg/dL   Calcium 8.7 (L) 8.9 - 10.3 mg/dL   Total Protein 7.5 6.5 - 8.1 g/dL   Albumin 3.8 3.5 - 5.0 g/dL   AST 17 15 - 41 U/L   ALT 19 0 - 44 U/L   Alkaline Phosphatase 86 38 - 126 U/L   Total Bilirubin 0.6 0.3 - 1.2 mg/dL   GFR, Estimated >60 >60 mL/min    Comment: (NOTE) Calculated using the CKD-EPI Creatinine Equation (2021)    Anion gap 9 5 - 15    Comment: Performed at [facility] [address] [place],  [address]  CBC with Differential     Status: None   Collection Time: 04/18/22  8:00 PM  Result Value Ref Range   WBC 7.9 4.0 - 10.5 K/uL   RBC 5.25 4.22 - 5.81 MIL/uL   Hemoglobin 15.5 13.0 - 17.0 g/dL   HCT 47.6 39.0 - 52.0 %  MCV 90.7 80.0 - 100.0 fL   MCH 29.5 26.0 - 34.0 pg   MCHC 32.6 30.0 - 36.0 g/dL   RDW 12.8 11.5 - 15.5 %   Platelets 253 150 - 400 K/uL   nRBC 0.0 0.0 - 0.2 %   Neutrophils Relative % 74 %   Neutro Abs 5.8 1.7 - 7.7 K/uL   Lymphocytes Relative 10 %   Lymphs Abs 0.8 0.7 - 4.0 K/uL   Monocytes Relative 11 %   Monocytes Absolute 0.8 0.1 - 1.0 K/uL   Eosinophils Relative 4 %   Eosinophils Absolute 0.3 0.0 - 0.5 K/uL   Basophils Relative 0 %   Basophils Absolute 0.0 0.0 - 0.1 K/uL   Immature Granulocytes 1 %   Abs Immature Granulocytes 0.04 0.00 - 0.07 K/uL    Comment: Performed at [facility] [address] [place], [place] [address]  Troponin I (High Sensitivity)     Status: None   Collection Time: 04/19/22  6:10 AM  Result Value Ref Range   Troponin I (High Sensitivity) 7 <18 ng/L    Comment: (NOTE) Elevated high sensitivity troponin I (hsTnI) values and significant  changes  across serial measurements may suggest ACS but many other  chronic and acute conditions are known to elevate hsTnI results.  Refer to the "Links" section for chest pain algorithms and additional  guidance. Performed at [facility], [place] [address] [place], [place] [address]   Heparin level (unfractionated)     Status: None   Collection Time: 04/19/22  8:15 AM  Result Value Ref Range   Heparin Unfractionated 0.59 0.30 - 0.70 IU/mL    Comment: (NOTE) The clinical reportable range upper limit is being lowered to >1.10 to align with the FDA approved guidance for the current laboratory assay.  If heparin results are below expected values, and patient dosage has  been confirmed, suggest follow up testing of antithrombin III levels. Performed at [facility], [place] [address], [place], [place] [address]    CT Angio Chest Pulmonary Embolism (PE) W or WO Contrast  Result Date: 04/19/2022 CLINICAL DATA:  Pulmonary embolism suspected EXAM: CT ANGIOGRAPHY CHEST WITH CONTRAST TECHNIQUE: Multidetector CT imaging of the chest was performed using the standard protocol during bolus administration of intravenous contrast. Multiplanar CT image reconstructions and MIPs were obtained to evaluate the vascular anatomy. RADIATION DOSE REDUCTION: This exam was performed according to the departmental dose-optimization program which includes automated exposure control, adjustment of the mA and/or kV according to patient size and/or use of iterative reconstruction technique. CONTRAST:  100m OMNIPAQUE IOHEXOL 350 MG/ML SOLN COMPARISON:  10/06/2019 FINDINGS: Cardiovascular: Satisfactory opacification of the pulmonary arteries to the segmental level. Segmental pulmonary embolism with occlusion in the left lower lobe affecting the posterior basal segment. Nonocclusive embolus in the superior segment right lower lobe. RV to LV ratio measures 0.8 to 1; the clot burden is not highly suggestive of right heart strain.  Main pulmonary artery measures 3.5 cm, similar to 2021 and suggesting chronic pulmonary hypertension Mediastinum/Nodes: No adenopathy or mass. Lungs/Pleura: Small left pleural effusion. Multi segment atelectasis at the lung bases. There could be superimposed pulmonary ischemia in the left lower lobe. Suspect mild emphysema. Upper Abdomen: No acute finding Musculoskeletal: No acute finding Review of the MIP images confirms the above findings. Critical Value/emergent results were called by telephone at the time of interpretation on 04/19/2022 at 5:54 am to [facility], who verbally acknowledged these  results. IMPRESSION: 1. Acute pulmonary embolism affecting bilateral segmental branches, occlusive in the left lower lobe where there is likely pulmonary ischemia. Stable heart size when compared to 2021. 2. Dependent opacities primarily attributed atelectasis with small left pleural effusion. Electronically Signed   By: Jorje Guild M.D.   On: 04/19/2022 05:55   DG Chest 2 View  Result Date: 04/18/2022 CLINICAL DATA:  Chest pain, productive cough EXAM: CHEST - 2 VIEW COMPARISON:  Chest radiograph dated July 23, 2020. FINDINGS: The heart is mildly enlarged atherosclerotic calcification of the aortic arch right lung is clear. Left basilar opacity suggesting atelectasis or infiltrate. No acute osseous abnormality bilateral glenohumeral osteoarthritis. IMPRESSION: Left basilar opacity suggesting atelectasis or infiltrate. Follow-up examination to resolution is recommended. Electronically Signed   By: Keane Police D.O.   On: 04/18/2022 12:15    Pending Labs Unresulted Labs (From admission, onward)     Start     Ordered   04/20/22 0500  Heparin level (unfractionated)  Daily,   R      04/19/22 0050   04/20/22 0500  Heparin level (unfractionated)  Daily,   R      04/19/22 1006   04/19/22 1400  Heparin level (unfractionated)  Once-Timed,   TIMED        04/19/22 1006   04/19/22 0800  CBC   Daily,   R      04/19/22 0050   04/19/22 0700  CBC  Once,   R        04/19/22 0700            Vitals/Pain Today's Vitals   04/19/22 0630 04/19/22 0949 04/19/22 1000 04/19/22 1410  BP: (!) 152/90  (!) 154/91 107/61  Pulse: 76  74 70  Resp: '17  18 16  '$ Temp:  98.3 F (36.8 C)  97.9 F (36.6 C)  TempSrc:  Oral  Oral  SpO2: 93%  93% 92%  Weight:      Height:      PainSc:        Isolation Precautions No active isolations  Medications Medications  heparin ADULT infusion 100 units/mL (25000 units/260m) (1,600 Units/hr Intravenous New Bag/Given 04/19/22 1414)  HYDROmorphone (DILAUDID) injection 0.5 mg (0.5 mg Intravenous Given 04/19/22 0945)  HYDROcodone-acetaminophen (NORCO/VICODIN) 5-325 MG per tablet 1 tablet (has no administration in time range)  ondansetron (ZOFRAN) injection 4 mg (has no administration in time range)  acetaminophen (TYLENOL) tablet 650 mg (has no administration in time range)  carvedilol (COREG) tablet 12.5 mg (12.5 mg Oral Given 04/19/22 1124)  rosuvastatin (CRESTOR) tablet 10 mg (10 mg Oral Given 04/19/22 1124)  famotidine (PEPCID) tablet 20 mg (20 mg Oral Given 04/19/22 1127)  memantine (NAMENDA) tablet 10 mg (10 mg Oral Given 04/19/22 1124)  pantoprazole (PROTONIX) EC tablet 40 mg (40 mg Oral Given 04/19/22 1125)  tamsulosin (FLOMAX) capsule 0.4 mg (0.4 mg Oral Given 04/19/22 1124)  albuterol (VENTOLIN HFA) 108 (90 Base) MCG/ACT inhaler 1-2 puff (has no administration in time range)  loratadine (CLARITIN) tablet 10 mg (10 mg Oral Given 04/19/22 1124)  montelukast (SINGULAIR) tablet 10 mg (10 mg Oral Given 04/19/22 1124)  furosemide (LASIX) injection 40 mg (has no administration in time range)  ipratropium-albuterol (DUONEB) 0.5-2.5 (3) MG/3ML nebulizer solution 3 mL (has no administration in time range)  mometasone-formoterol (DULERA) 200-5 MCG/ACT inhaler 2 puff (has no administration in time range)  HYDROcodone-acetaminophen (NORCO/VICODIN) 5-325 MG per  tablet 1 tablet (1 tablet Oral Given 04/18/22 2001)  heparin  bolus via infusion 6,000 Units (6,000 Units Intravenous Bolus from Bag 04/19/22 0115)  morphine (PF) 4 MG/ML injection 4 mg (4 mg Intravenous Given 04/19/22 0138)  ondansetron (ZOFRAN) injection 4 mg (4 mg Intravenous Given 04/19/22 0137)  iohexol (OMNIPAQUE) 350 MG/ML injection 75 mL (75 mLs Intravenous Contrast Given 04/19/22 0534)  potassium chloride SA (KLOR-CON M) CR tablet 40 mEq (40 mEq Oral Given 04/19/22 1125)    Mobility walks     Focused Assessments Pulmonary Assessment Handoff:  Lung sounds: Bilateral Breath Sounds: Diminished 2L [place]      R Recommendations: See Admitting Provider Note  Report given to:   Additional Notes:  Aox4 here with SOB, has PE in L Lung, 2L [place] not baseline, DOE when walking, uses urinal, son at bedside, Heparin at 1600 units per hour, Pain with cough PRN works well

## 2022-04-19 NOTE — ED Provider Notes (Addendum)
[facility] Provider Note   CSN: [id number] Arrival date & time: 04/18/22  6789     History  Chief Complaint  Patient presents with   Pulmonary Embolism    Curtis Bell is a 72 y.o. male.  Patient presents to the emergency department at the request of primary care.  Patient has been sick for 4 days with URI symptoms.  Additionally, patient experiencing pain in the left side of his chest and ribs that worsens when he takes deep breath.  He feels short of breath like he cannot take a deep breath.  Patient had an outpatient CT angio study, ordered by his PCP, earlier today.  He was called tonight and told that he had blood clots in both of his lungs and he should come to the ER.       Home Medications Prior to Admission medications   Medication Sig Start Date End Date Taking? Authorizing Provider  acetaminophen (TYLENOL ARTHRITIS PAIN) 650 MG CR tablet Per bottle as needed    [provider]  albuterol (VENTOLIN HFA) 108 (90 Base) MCG/ACT inhaler Inhale into the lungs every 6 (six) hours as needed for wheezing or shortness of breath. Patient has run out of this medication    [provider]  amLODipine (NORVASC) 10 MG tablet Take 0.5 tablets by mouth every morning.  03/31/15   [provider]  Ascorbic Acid (VITAMIN C) 1000 MG tablet Take 1,000 mg by mouth daily. Plus Vitamin E    [provider]  aspirin 81 MG tablet Take 81 mg by mouth daily.      [provider]  Boswellia-Glucosamine-Vit D (OSTEO BI-FLEX ONE PER DAY PO) Take by mouth.    [provider]  budesonide-formoterol (SYMBICORT) 160-4.5 MCG/ACT inhaler Inhale 2 puffs into the lungs 2 (two) times daily. 06/05/15   Mannam, Hart Robinsons, MD  buPROPion (WELLBUTRIN SR) 100 MG 12 hr tablet Take 100 mg by mouth daily. 07/07/19   [provider]  carvedilol (COREG) 12.5 MG tablet 12.5 mg. twice daily 08/13/15   [provider]  cetirizine (ZYRTEC) 10 MG tablet Take 10 mg by mouth daily.    [provider]  Cholecalciferol (VITAMIN D3) 1.25 MG (50000 UT) CAPS TK 1 C PO 1 TIME A WK 05/03/18   [provider]  fluticasone (FLONASE) 50 MCG/ACT nasal spray Place 2 sprays into both nostrils daily. 06/11/15   Mannam, Hart Robinsons, MD  furosemide (LASIX) 40 MG tablet Take 40 mg by mouth.    [provider]  Ginger, Zingiber officinalis, (GINGER PO) Take by mouth.    [provider]  ibuprofen (ADVIL,MOTRIN) 600 MG tablet Take by mouth.    [provider]  losartan (COZAAR) 50 MG tablet Take 50 mg by mouth daily. 01/14/19   [provider]  memantine (NAMENDA) 10 MG tablet Take 1 tablet (10 mg total) by mouth 2 (two) times daily. 03/11/19   Cameron Sprang, MD  montelukast (SINGULAIR) 10 MG tablet Take 10 mg by mouth daily. 06/08/19   [provider]  Multiple Vitamin (MULTIVITAMIN) tablet Take 1 tablet by mouth daily.    [provider]  Naproxen Sodium (ALEVE) 220 MG CAPS Take 1 capsule by mouth 2 (two) times daily.    [provider]  nitroGLYCERIN (NITROSTAT) 0.4 MG SL tablet 1 under tongue every 5 mins x3 if needed for chest pain 08/20/15   [provider]  ondansetron (ZOFRAN-ODT)  4 MG disintegrating tablet Take 1 tablet (4 mg total) by mouth every 8 (eight) hours as needed for nausea or vomiting. 02/12/22   Gareth Morgan, MD  Probiotic Product (CULTURELLE PROBIOTICS PO) Take by mouth.    [provider]  Respiratory Therapy Supplies (FLUTTER) DEVI Use as directed. 08/30/15   Mannam, [place] Robinsons, MD  tadalafil (CIALIS) 5 MG tablet Take 5 mg by mouth as needed.     [provider]  tamsulosin (FLOMAX) 0.4 MG CAPS capsule Take 0.4 mg by mouth daily. Not taking. Ran out of the medication. 04/06/19   [provider]  Turmeric (QC TUMERIC COMPLEX PO) Take by mouth.    [provider]      Allergies     Patient has no known allergies.    Review of Systems   Review of Systems  Physical Exam Updated Vital Signs BP (!) 158/92   Pulse 84   Temp 97.7 F (36.5 C) (Oral)   Resp (!) 23   Ht '5\' 7"'$  (1.702 m)   Wt 104.3 kg   SpO2 93%   BMI 36.02 kg/m  Physical Exam Vitals and nursing note reviewed.  Constitutional:      General: He is not in acute distress.    Appearance: He is well-developed.  HENT:     Head: Normocephalic and atraumatic.     Mouth/Throat:     Mouth: Mucous membranes are moist.  Eyes:     General: Vision grossly intact. Gaze aligned appropriately.     Extraocular Movements: Extraocular movements intact.     Conjunctiva/sclera: Conjunctivae normal.  Cardiovascular:     Rate and Rhythm: Normal rate and regular rhythm.     Pulses: Normal pulses.     Heart sounds: Normal heart sounds, S1 normal and S2 normal. No murmur heard.    No friction rub. No gallop.  Pulmonary:     Effort: Pulmonary effort is normal. Tachypnea present. No respiratory distress.     Breath sounds: Normal breath sounds. Decreased air movement present.  Abdominal:     Palpations: Abdomen is soft.     Tenderness: There is no abdominal tenderness. There is no guarding or rebound.     Hernia: No hernia is present.  Musculoskeletal:        General: No swelling.     Cervical back: Full passive range of motion without pain, normal range of motion and neck supple. No pain with movement, spinous process tenderness or muscular tenderness. Normal range of motion.     Right lower leg: No edema.     Left lower leg: No edema.  Skin:    General: Skin is warm and dry.     Capillary Refill: Capillary refill takes less than 2 seconds.     Findings: No ecchymosis, erythema, lesion or wound.  Neurological:     Mental Status: He is alert and oriented to person, place, and time.     GCS: GCS eye subscore is 4. GCS verbal subscore is 5. GCS motor subscore is 6.     Cranial Nerves: Cranial nerves 2-12 are  intact.     Sensory: Sensation is intact.     Motor: Motor function is intact. No weakness or abnormal muscle tone.     Coordination: Coordination is intact.  Psychiatric:        Mood and Affect: Mood normal.        Speech: Speech normal.        Behavior: Behavior normal.  ED Results / Procedures / Treatments   Labs (all labs ordered are listed, but only abnormal results are displayed) Labs Reviewed  COMPREHENSIVE METABOLIC PANEL - Abnormal; Notable for the following components:      Result Value   Glucose, Bld 104 (*)    Calcium 8.7 (*)    All other components within normal limits  RESP PANEL BY RT-PCR (RSV, FLU A&B, COVID)  [id number]  CBC WITH DIFFERENTIAL/PLATELET  HEPARIN LEVEL (UNFRACTIONATED)  CBC    EKG EKG Interpretation  Date/Time:  Friday April 18 2022 19:53:25 EST Ventricular Rate:  81 PR Interval:  174 QRS Duration: 78 QT Interval:  356 QTC Calculation: 413 R Axis:   58 Text Interpretation: Sinus rhythm with Premature atrial complexes Otherwise normal ECG When compared with ECG of 12-Feb-2022 04:02, PREVIOUS ECG IS PRESENT Confirmed by Orpah Greek [phone]) on 04/19/2022 12:58:16 AM  Radiology DG Chest 2 View  Result Date: 04/18/2022 CLINICAL DATA:  Chest pain, productive cough EXAM: CHEST - 2 VIEW COMPARISON:  Chest radiograph dated July 23, 2020. FINDINGS: The heart is mildly enlarged atherosclerotic calcification of the aortic arch right lung is clear. Left basilar opacity suggesting atelectasis or infiltrate. No acute osseous abnormality bilateral glenohumeral osteoarthritis. IMPRESSION: Left basilar opacity suggesting atelectasis or infiltrate. Follow-up examination to resolution is recommended. Electronically Signed   By: Keane Police D.O.   On: 04/18/2022 12:15    Procedures Procedures    Medications Ordered in ED Medications  heparin ADULT infusion 100 units/mL (25000 units/261m) (1,600 Units/hr Intravenous New Bag/Given 04/19/22 0115)   HYDROcodone-acetaminophen (NORCO/VICODIN) 5-325 MG per tablet 1 tablet (1 tablet Oral Given 04/18/22 2001)  heparin bolus via infusion 6,000 Units (6,000 Units Intravenous Bolus from Bag 04/19/22 0115)  morphine (PF) 4 MG/ML injection 4 mg (4 mg Intravenous Given 04/19/22 0138)  ondansetron (ZOFRAN) injection 4 mg (4 mg Intravenous Given 04/19/22 0137)  iohexol (OMNIPAQUE) 350 MG/ML injection 75 mL (75 mLs Intravenous Contrast Given 04/19/22 0534)    ED Course/ Medical Decision Making/ A&P                             Medical Decision Making Amount and/or Complexity of Data Reviewed Labs: ordered. Decision-making details documented in ED Course. Radiology: ordered and independent interpretation performed. Decision-making details documented in ED Course. ECG/medicine tests: ordered and independent interpretation performed. Decision-making details documented in ED Course.  Risk Prescription drug management. Decision regarding hospitalization.   Patient presents to the emergency department for evaluation of pleuritic chest pain.  Patient was referred to an outpatient imaging center by his PCP and had a CT angiography.  He was told that he had blood clots in his lungs.  The imaging center was a [facility] imaging center.  [place]  He had a CD with him that reportedly had a report on it, but it would not open and therefore it was unclear how much clot and if he had heart strain.  I attempted to gather these records for a number of hours but was unable to get an actual answer.  I discussed with him risks and benefits of repeating the CT.  His renal function is normal.  He does not have an allergy.  We decided to repeat the CT to ensure that we knew how to treat him past.  I did start him on heparin at arrival.  His CT does show bilateral PE  with likely pulmonary infarct causing the pleuritic pain that he is experiencing.  Oxygen saturations are in the low  90s and he does appear to be somewhat uncomfortable.  He has required multiple doses of analgesia.  Will admit to hospitalist.    PESI score - 122 (HIGH RISK)     Final Clinical Impression(s) / ED Diagnoses Final diagnoses:  Acute pulmonary embolism, unspecified pulmonary embolism type, unspecified whether acute cor pulmonale present [facility])    Rx / DC Orders ED Discharge Orders     None         Orpah Greek, MD 04/19/22 0557    Orpah Greek, MD 04/19/22 [phone]

## 2022-04-19 NOTE — H&P (Signed)
History and Physical    Curtis Bell TZG:[id number] DOB: June 08, 1950 DOA: 04/18/2022  Referring MD/NP/PA: EDP PCP:  Patient coming from: Home  Chief Complaint: Dyspnea, chest pain  HPI: Curtis Bell is a 71/M with history of COPD, prostate cancer,?  CHF on Lasix, hypertension presented to the ED with left-sided pleuritic chest pain, cough and shortness of breath, symptoms started about a week ago. -Patient reports having chronic swelling, uses Lasix regularly ED Course: Tachypneic, vital signs stable, troponin 7, CT chest with bilateral PE, atelectasis, small pleural effusion  Review of Systems: As per HPI otherwise 14 point review of systems negative.   Past Medical History:  Diagnosis Date   Biochemically recurrent malignant neoplasm of prostate ([place]) 04/20/2019   Chronic fatigue syndrome    Chronic pansinusitis 04/13/2017   COPD (chronic obstructive pulmonary disease) (HCC)    Deviated nasal septum 06/04/2017   Hypertension    Laryngopharyngeal reflux (LPR) 01/24/2019   Osteoporosis    Other emphysema 08/31/2017   Prostate cancer     Past Surgical History:  Procedure Laterality Date   ANTERIOR CRUCIATE LIGAMENT REPAIR     CATARACT EXTRACTION     HERNIA REPAIR     KNEE SURGERY     PROSTATE SURGERY       reports that he quit smoking about 40 years ago. His smoking use included cigarettes. He has a 15.00 pack-year smoking history. He has never used smokeless tobacco. He reports that he does not drink alcohol and does not use drugs.  No Known Allergies  Family History  Problem Relation Age of Onset   Memory loss Mother        Marena Chancy if ever formally diagnosed with dementia   Prostate cancer Father        lived to be 50 years old   Lung disease Father    Cataracts Father    Amblyopia Neg Hx    Blindness Neg Hx    Glaucoma Neg Hx    Macular degeneration Neg Hx    Retinal detachment Neg Hx    Strabismus Neg Hx    Retinitis pigmentosa Neg Hx    Breast cancer  Neg Hx    Colon cancer Neg Hx    Pancreatic cancer Neg Hx      Prior to Admission medications   Medication Sig Start Date End Date Taking? Authorizing Provider  acetaminophen (TYLENOL) 325 MG tablet Take 650 mg by mouth every 6 (six) hours as needed for moderate pain.   Yes [provider]  albuterol (VENTOLIN HFA) 108 (90 Base) MCG/ACT inhaler Inhale 1-2 puffs into the lungs every 6 (six) hours as needed for wheezing or shortness of breath. Patient has run out of this medication   Yes [provider]  amLODipine-olmesartan (AZOR) 10-40 MG tablet Take 1 tablet by mouth daily.   Yes [provider]  aspirin 81 MG tablet Take 81 mg by mouth daily.     Yes [provider]  carvedilol (COREG) 12.5 MG tablet 12.5 mg. twice daily 08/13/15  Yes [provider]  cetirizine (ZYRTEC) 10 MG tablet Take 10 mg by mouth daily.   Yes [provider]  ergocalciferol (VITAMIN D2) 1.25 MG (50000 UT) capsule Take 50,000 Units by mouth once a week.   Yes [provider]  famotidine (PEPCID) 40 MG tablet Take 20 mg by mouth daily.   Yes [provider]  fluticasone-salmeterol (WIXELA INHUB) 250-50 MCG/ACT AEPB Inhale 1 puff into the lungs  at bedtime.   Yes [provider]  furosemide (LASIX) 40 MG tablet Take 20 mg by mouth daily.   Yes [provider]  guaifenesin (HUMIBID E) 400 MG TABS tablet Take 400 mg by mouth every 6 (six) hours as needed (mucus and cough).   Yes [provider]  ipratropium (ATROVENT) 0.06 % nasal spray Place 2 sprays into both nostrils 4 (four) times daily.   Yes [provider]  meloxicam (MOBIC) 7.5 MG tablet Take 7.5 mg by mouth daily as needed for pain.   Yes [provider]  memantine (NAMENDA) 10 MG tablet Take 1 tablet (10 mg total) by mouth 2 (two) times daily. 03/11/19  Yes Cameron Sprang, MD  montelukast (SINGULAIR) 10 MG tablet Take 10 mg by mouth daily. 06/08/19   Yes [provider]  Multiple Vitamin (MULTIVITAMIN) tablet Take 1 tablet by mouth daily.   Yes [provider]  Multiple Vitamins-Minerals (PRESERVISION AREDS 2 PO) Take 1 capsule by mouth 2 (two) times daily.   Yes [provider]  nitroGLYCERIN (NITROSTAT) 0.4 MG SL tablet 1 under tongue every 5 mins x3 if needed for chest pain 08/20/15  Yes [provider]  pantoprazole (PROTONIX) 40 MG tablet Take 40 mg by mouth daily.   Yes [provider]  rosuvastatin (CRESTOR) 20 MG tablet Take 10 mg by mouth daily.   Yes [provider]  tamsulosin (FLOMAX) 0.4 MG CAPS capsule Take 0.4 mg by mouth daily. Not taking. Ran out of the medication. 04/06/19  Yes [provider]  budesonide-formoterol (SYMBICORT) 160-4.5 MCG/ACT inhaler Inhale 2 puffs into the lungs 2 (two) times daily. Patient not taking: Reported on 04/19/2022 06/05/15   Marshell Garfinkel, MD  fluticasone (FLONASE) 50 MCG/ACT nasal spray Place 2 sprays into both nostrils daily. Patient not taking: Reported on 04/19/2022 06/11/15   Marshell Garfinkel, MD  ondansetron (ZOFRAN-ODT) 4 MG disintegrating tablet Take 1 tablet (4 mg total) by mouth every 8 (eight) hours as needed for nausea or vomiting. Patient not taking: Reported on 04/19/2022 02/12/22   Gareth Morgan, MD  Respiratory Therapy Supplies (FLUTTER) DEVI Use as directed. Patient not taking: Reported on 04/19/2022 08/30/15   Marshell Garfinkel, MD    Physical Exam: Vitals:   04/19/22 0545 04/19/22 0600 04/19/22 0630 04/19/22 0949  BP: (!) 158/92 (!) 156/94 (!) 152/90   Pulse: 84 78 76   Resp: (!) 23 (!) 21 17   Temp:    98.3 F (36.8 C)  TempSrc:    Oral  SpO2: 93% 93% 93%   Weight:      Height:        Obese chronically ill male sitting up in bed, AAOx3, no distress HEENT: Positive JVD CVS: S1-S2, regular rhythm Lungs: Poor air movement bilaterally Abdomen: Soft, nontender, bowel sounds present Extremities: 1+ edema Psych:  Appropriate mood and affect  Labs on Admission: I have personally reviewed following labs and imaging studies  CBC: Recent Labs  Lab 04/18/22 2000  WBC 7.9  NEUTROABS 5.8  HGB 15.5  HCT 47.6  MCV 90.7  PLT 242   Basic Metabolic Panel: Recent Labs  Lab 04/18/22 2000  NA 138  K 4.1  CL 103  CO2 26  GLUCOSE 104*  BUN 8  CREATININE 0.87  CALCIUM 8.7*   GFR: Estimated Creatinine Clearance: 88.4 mL/min (by C-G formula based on SCr of 0.87 mg/dL). Liver Function Tests: Recent Labs  Lab 04/18/22 2000  AST 17  ALT 19  ALKPHOS 86  BILITOT 0.6  PROT 7.5  ALBUMIN 3.8   No results for input(s): "LIPASE", "AMYLASE" in the last 168 hours. No results for input(s): "AMMONIA" in the last 168 hours. Coagulation Profile: No results for input(s): "INR", "PROTIME" in the last 168 hours. Cardiac Enzymes: No results for input(s): "CKTOTAL", "CKMB", "CKMBINDEX", "TROPONINI" in the last 168 hours. BNP (last 3 results) No results for input(s): "PROBNP" in the last 8760 hours. HbA1C: No results for input(s): "HGBA1C" in the last 72 hours. CBG: No results for input(s): "GLUCAP" in the last 168 hours. Lipid Profile: No results for input(s): "CHOL", "HDL", "LDLCALC", "TRIG", "CHOLHDL", "LDLDIRECT" in the last 72 hours. Thyroid Function Tests: No results for input(s): "TSH", "T4TOTAL", "FREET4", "T3FREE", "THYROIDAB" in the last 72 hours. Anemia Panel: No results for input(s): "VITAMINB12", "FOLATE", "FERRITIN", "TIBC", "IRON", "RETICCTPCT" in the last 72 hours. Urine analysis:    Component Value Date/Time   COLORURINE YELLOW 02/12/2022 0745   APPEARANCEUR CLEAR 02/12/2022 0745   LABSPEC 1.020 02/12/2022 0745   PHURINE 7.0 02/12/2022 0745   GLUCOSEU NEGATIVE 02/12/2022 0745   HGBUR NEGATIVE 02/12/2022 0745   BILIRUBINUR NEGATIVE 02/12/2022 0745   KETONESUR NEGATIVE 02/12/2022 0745   PROTEINUR NEGATIVE 02/12/2022 0745   UROBILINOGEN 0.2 03/20/2013 0048   NITRITE NEGATIVE  02/12/2022 0745   LEUKOCYTESUR NEGATIVE 02/12/2022 0745   Sepsis Labs: '@LABRCNTIP'$ (procalcitonin:4,lacticidven:4) ) Recent Results (from the past 240 hour(s))  Resp panel by RT-PCR (RSV, Flu A&B, Covid) Anterior Nasal Swab     Status: None   Collection Time: 04/18/22  3:58 PM   Specimen: Anterior Nasal Swab  Result Value Ref Range Status   SARS Coronavirus 2 by RT PCR NEGATIVE NEGATIVE Final    Comment: (NOTE) SARS-CoV-2 target nucleic acids are NOT DETECTED.  The SARS-CoV-2 RNA is generally detectable in upper respiratory specimens during the acute phase of infection. The lowest concentration of SARS-CoV-2 viral copies this assay can detect is 138 copies/mL. A negative result does not preclude SARS-Cov-2 infection and should not be used as the sole basis for treatment or other patient management decisions. A negative result may occur with  improper specimen collection/handling, submission of specimen other than nasopharyngeal swab, presence of viral mutation(s) within the areas targeted by this assay, and inadequate number of viral copies(<138 copies/mL). A negative result must be combined with clinical observations, patient history, and epidemiological information. The expected result is Negative.  Fact Sheet for Patients:  EntrepreneurPulse.com.au  Fact Sheet for Healthcare Providers:  IncredibleEmployment.be  This test is no t yet approved or cleared by the [place] FDA and  has been authorized for detection and/or diagnosis of SARS-CoV-2 by FDA under an Emergency Use Authorization (EUA). This EUA will remain  in effect (meaning this test can be used) for the duration of the COVID-19 declaration under Section 564(b)(1) of the Act, 21 U.S.C.section 360bbb-3(b)(1), unless the authorization is terminated  or revoked sooner.       Influenza A by PCR NEGATIVE NEGATIVE Final   Influenza B by PCR NEGATIVE NEGATIVE Final    Comment:  (NOTE) The Xpert Xpress SARS-CoV-2/FLU/RSV plus assay is intended as an aid in the diagnosis of influenza from Nasopharyngeal swab specimens and should not be used as a sole basis for treatment. Nasal washings and aspirates are unacceptable for Xpert Xpress SARS-CoV-2/FLU/RSV testing.  Fact Sheet for Patients: EntrepreneurPulse.com.au  Fact Sheet for Healthcare Providers: IncredibleEmployment.be  This test is not yet approved or cleared by the [place] FDA and has been  authorized for detection and/or diagnosis of SARS-CoV-2 by FDA under an Emergency Use Authorization (EUA). This EUA will remain in effect (meaning this test can be used) for the duration of the COVID-19 declaration under Section 564(b)(1) of the Act, 21 U.S.C. section 360bbb-3(b)(1), unless the authorization is terminated or revoked.     Resp Syncytial Virus by PCR NEGATIVE NEGATIVE Final    Comment: (NOTE) Fact Sheet for Patients: EntrepreneurPulse.com.au  Fact Sheet for Healthcare Providers: IncredibleEmployment.be  This test is not yet approved or cleared by the [place] FDA and has been authorized for detection and/or diagnosis of SARS-CoV-2 by FDA under an Emergency Use Authorization (EUA). This EUA will remain in effect (meaning this test can be used) for the duration of the COVID-19 declaration under Section 564(b)(1) of the Act, 21 U.S.C. section 360bbb-3(b)(1), unless the authorization is terminated or revoked.  Performed at [facility], [place] [address], [place], [place] [address]      Radiological Exams on Admission: CT Angio Chest Pulmonary Embolism (PE) W or WO Contrast  Result Date: 04/19/2022 CLINICAL DATA:  Pulmonary embolism suspected EXAM: CT ANGIOGRAPHY CHEST WITH CONTRAST TECHNIQUE: Multidetector CT imaging of the chest was performed using the standard protocol during bolus administration of intravenous  contrast. Multiplanar CT image reconstructions and MIPs were obtained to evaluate the vascular anatomy. RADIATION DOSE REDUCTION: This exam was performed according to the departmental dose-optimization program which includes automated exposure control, adjustment of the mA and/or kV according to patient size and/or use of iterative reconstruction technique. CONTRAST:  6m OMNIPAQUE IOHEXOL 350 MG/ML SOLN COMPARISON:  10/06/2019 FINDINGS: Cardiovascular: Satisfactory opacification of the pulmonary arteries to the segmental level. Segmental pulmonary embolism with occlusion in the left lower lobe affecting the posterior basal segment. Nonocclusive embolus in the superior segment right lower lobe. RV to LV ratio measures 0.8 to 1; the clot burden is not highly suggestive of right heart strain. Main pulmonary artery measures 3.5 cm, similar to 2021 and suggesting chronic pulmonary hypertension Mediastinum/Nodes: No adenopathy or mass. Lungs/Pleura: Small left pleural effusion. Multi segment atelectasis at the lung bases. There could be superimposed pulmonary ischemia in the left lower lobe. Suspect mild emphysema. Upper Abdomen: No acute finding Musculoskeletal: No acute finding Review of the MIP images confirms the above findings. Critical Value/emergent results were called by telephone at the time of interpretation on 04/19/2022 at 5:54 am [facility] who verbally acknowledged these results. IMPRESSION: 1. Acute pulmonary embolism affecting bilateral segmental branches, occlusive in the left lower lobe where there is likely pulmonary ischemia. Stable heart size when compared to 2021. 2. Dependent opacities primarily attributed atelectasis with small left pleural effusion. Electronically Signed   By: JJorje GuildM.D.   On: 04/19/2022 05:55   DG Chest 2 View  Result Date: 04/18/2022 CLINICAL DATA:  Chest pain, productive cough EXAM: CHEST - 2 VIEW COMPARISON:  Chest radiograph dated July 23, 2020. FINDINGS: The heart is mildly enlarged atherosclerotic calcification of the aortic arch right lung is clear. Left basilar opacity suggesting atelectasis or infiltrate. No acute osseous abnormality bilateral glenohumeral osteoarthritis. IMPRESSION: Left basilar opacity suggesting atelectasis or infiltrate. Follow-up examination to resolution is recommended. Electronically Signed   By: IKeane PoliceD.O.   On: 04/18/2022 12:15    EKG: Independently reviewed.  Sinus rhythm, no acute ST-T wave changes  Assessment/Plan  Acute pulmonary embolism (HCC)   -Continue IV heparin today, changed to DOAC tomorrow   -Risk factors include known history  of prostate cancer, has chronic lung nodules   -Check 2D echocardiogram  Suspected diastolic CHF -Check 2D echo, add IV Lasix today -Monitor I's/O, daily weights, BMP in a.m.    Hypertension -Stable, diuretics as above, hold ARB    COPD (chronic obstructive pulmonary disease) (HCC) -Stable continue nebs as needed    Pulmonary nodules/lesions, multiple -Follow-up with pulmonary  Prostate cancer -Follow-up with oncology   = DVT prophylaxis: IV heparin Code Status: Full code Family Communication: None present Disposition Plan: Home likely 2 to 3 days Consults called: NA Admission status: Inpatient  Domenic Polite MD [facility]   04/19/2022, 9:58 AM

## 2022-04-19 NOTE — Progress Notes (Signed)
ANTICOAGULATION CONSULT NOTE - Initial Consult  Pharmacy Consult for heparin Indication: pulmonary embolus  No Known Allergies  Patient Measurements: Height: '5\' 7"'$  (170.2 cm) Weight: 104.3 kg (230 lb) IBW/kg (Calculated) : 66.1 Heparin Dosing Weight: 89 Kg  Vital Signs: Temp: 97.9 F (36.6 C) (01/19 2257) Temp Source: Oral (01/19 2257) BP: 144/93 (01/20 0015) Pulse Rate: 76 (01/20 0015)  Labs: Recent Labs    04/18/22 2000  HGB 15.5  HCT 47.6  PLT 253  CREATININE 0.87    Estimated Creatinine Clearance: 88.4 mL/min (by C-G formula based on SCr of 0.87 mg/dL).   Medical History: Past Medical History:  Diagnosis Date   Biochemically recurrent malignant neoplasm of prostate ([place]) 04/20/2019   Chronic fatigue syndrome    Chronic pansinusitis 04/13/2017   COPD (chronic obstructive pulmonary disease) ([place])    Deviated nasal septum 06/04/2017   Hypertension    Laryngopharyngeal reflux (LPR) 01/24/2019   Osteoporosis    Other emphysema 08/31/2017   Prostate cancer     Assessment: 49 yoM Pt sent by PCP for a confirmed bilateral PE. No anticoagulation reported prior to admission. CBC WNL. Pharmacy to start heparin gtt. Recent visit to GI clinic for positive FIT test, but patient not aware of any melena or hematochezia (concern for Adenomatous colon polyps)  Goal of Therapy:  Heparin level 0.3-0.7 units/ml Monitor platelets by anticoagulation protocol: Yes   Plan:  Give 6000 units bolus x 1 Start heparin infusion at 1600 units/hr Check anti-Xa level in 6 hours and daily while on heparin Continue to monitor H&H and platelets  Georga Bora, PharmD Clinical Pharmacist 04/19/2022 12:42 AM Please check AMION for all [place] numbers

## 2022-04-19 NOTE — Progress Notes (Addendum)
ANTICOAGULATION CONSULT NOTE - Follow-up Note  Pharmacy Consult for Heparin Indication: pulmonary embolus  No Known Allergies  Patient Measurements: Height: '5\' 7"'$  (170.2 cm) Weight: 104.3 kg (230 lb) IBW/kg (Calculated) : 66.1 Heparin Dosing Weight: 89kg  Vital Signs: Temp: 98.3 F (36.8 C) (01/20 0949) Temp Source: Oral (01/20 0949) BP: 152/90 (01/20 0630) Pulse Rate: 76 (01/20 0630)  Labs: Recent Labs    04/18/22 2000 04/19/22 0610 04/19/22 0815  HGB 15.5  --   --   HCT 47.6  --   --   PLT 253  --   --   HEPARINUNFRC  --   --  0.59  CREATININE 0.87  --   --   TROPONINIHS  --  7  --     Estimated Creatinine Clearance: 88.4 mL/min (by C-G formula based on SCr of 0.87 mg/dL).   Medical History: Past Medical History:  Diagnosis Date   Biochemically recurrent malignant neoplasm of prostate ([place]) 04/20/2019   Chronic fatigue syndrome    Chronic pansinusitis 04/13/2017   COPD (chronic obstructive pulmonary disease) ([place])    Deviated nasal septum 06/04/2017   Hypertension    Laryngopharyngeal reflux (LPR) 01/24/2019   Osteoporosis    Other emphysema 08/31/2017   Prostate cancer     Medications:  (Not in a hospital admission)  Scheduled:  Infusions:   heparin 1,600 Units/hr (04/19/22 0115)   PRN: HYDROmorphone (DILAUDID) injection  Assessment: 56 yoM Pt sent by PCP for a confirmed bilateral PE. Pharmacy to start heparin gtt.  Recent visit to GI clinic for positive FIT test, but patient not aware of any melena or hematochezia (concern for Adenomatous colon polyps)   Patient was not on anticoagulation prior to arrival. Started on 1600 units/hr IV heparin following 6000 unit IV heparin bolus. Resulting heparin level is 0.59 which is therapeutic.  No issues with infusion or bleeding per RN.  Hgb 15.5; plt 253 (1/19 labs - not collected this AM)  Goal of Therapy:  Heparin level 0.3-0.7 units/ml Monitor platelets by anticoagulation protocol: Yes   Plan:   Continue heparin infusion at 1600 units/hr Check anti-Xa level at 1400 and daily while on heparin Continue to monitor H&H and platelets  Lorelei Pont, PharmD, BCPS 04/19/2022 10:02 AM ED Clinical Pharmacist -  [phone]   Addendum  HL came back therapeutic again this PM. Cont current rate.  Onnie Boer, PharmD, BCIDP, AAHIVP, CPP Infectious Disease Pharmacist 04/19/2022 3:51 PM

## 2022-04-20 DIAGNOSIS — I2699 Other pulmonary embolism without acute cor pulmonale: Secondary | ICD-10-CM | POA: Diagnosis not present

## 2022-04-20 LAB — ECHOCARDIOGRAM COMPLETE
AR max vel: 2.24 cm2
AV Area VTI: 2.49 cm2
AV Area mean vel: 2.22 cm2
AV Mean grad: 4 mmHg
AV Peak grad: 6.9 mmHg
Ao pk vel: 1.31 m/s
Area-P 1/2: 2.8 cm2
Calc EF: 59 %
Height: 67 in
MV M vel: 2.8 m/s
MV Peak grad: 31.2 mmHg
S' Lateral: 2.7 cm
Single Plane A2C EF: 67.2 %
Single Plane A4C EF: 55.4 %
Weight: 3683.2 [oz_av]

## 2022-04-20 LAB — CBC
HCT: 41.1 % (ref 39.0–52.0)
Hemoglobin: 13.7 g/dL (ref 13.0–17.0)
MCH: 29.9 pg (ref 26.0–34.0)
MCHC: 33.3 g/dL (ref 30.0–36.0)
MCV: 89.7 fL (ref 80.0–100.0)
Platelets: 222 10*3/uL (ref 150–400)
RBC: 4.58 MIL/uL (ref 4.22–5.81)
RDW: 12.7 % (ref 11.5–15.5)
WBC: 6.2 10*3/uL (ref 4.0–10.5)
nRBC: 0 % (ref 0.0–0.2)

## 2022-04-20 LAB — BASIC METABOLIC PANEL
Anion gap: 8 (ref 5–15)
BUN: 10 mg/dL (ref 8–23)
CO2: 27 mmol/L (ref 22–32)
Calcium: 8.4 mg/dL — ABNORMAL LOW (ref 8.9–10.3)
Chloride: 101 mmol/L (ref 98–111)
Creatinine, Ser: 0.83 mg/dL (ref 0.61–1.24)
GFR, Estimated: >60 mL/min (ref >60)
Glucose, Bld: 116 mg/dL — ABNORMAL HIGH (ref 70–99)
Potassium: 4.2 mmol/L (ref 3.5–5.1)
Sodium: 136 mmol/L (ref 135–145)

## 2022-04-20 LAB — HEPARIN LEVEL (UNFRACTIONATED): Heparin Unfractionated: 0.58 [IU]/mL (ref 0.30–0.70)

## 2022-04-20 MED ORDER — APIXABAN 5 MG PO TABS: 5.0000 mg | ORAL_TABLET | Freq: Two times a day (BID) | ORAL | Status: DC

## 2022-04-20 MED ORDER — APIXABAN 5 MG PO TABS
10.0000 mg | ORAL_TABLET | Freq: Two times a day (BID) | ORAL | Status: DC
  Administered 2022-04-20 – 2022-04-21 (×3): 10 mg via ORAL
  Filled 2022-04-20 (×3): qty 2

## 2022-04-20 MED ORDER — POTASSIUM CHLORIDE CRYS ER 20 MEQ PO TBCR
40.0000 meq | EXTENDED_RELEASE_TABLET | Freq: Once | ORAL | Status: AC
  Administered 2022-04-20: 40 meq via ORAL
  Filled 2022-04-20: qty 2

## 2022-04-20 MED ORDER — POLYETHYLENE GLYCOL 3350 17 G PO PACK
17.0000 g | PACK | Freq: Every day | ORAL | Status: DC
  Administered 2022-04-20 – 2022-04-21 (×2): 17 g via ORAL
  Filled 2022-04-20 (×2): qty 1

## 2022-04-20 MED ORDER — FUROSEMIDE 10 MG/ML IJ SOLN
40.0000 mg | Freq: Two times a day (BID) | INTRAMUSCULAR | Status: AC
  Administered 2022-04-20: 40 mg via INTRAVENOUS
  Filled 2022-04-20 (×2): qty 4

## 2022-04-20 NOTE — Discharge Instructions (Signed)
Information on my medicine - ELIQUIS (apixaban)  This medication education was reviewed with me or my healthcare representative as part of my discharge preparation.    Why was Eliquis prescribed for you? Eliquis was prescribed to treat blood clots that may have been found in the veins of your legs (deep vein thrombosis) or in your lungs (pulmonary embolism) and to reduce the risk of them occurring again.  What do You need to know about Eliquis ? The starting dose is 10 mg (two 5 mg tablets) taken TWICE daily for the FIRST SEVEN (7) DAYS, then on 04/28/2022  the dose is reduced to ONE 5 mg tablet taken TWICE daily.  Eliquis may be taken with or without food.   Try to take the dose about the same time in the morning and in the evening. If you have difficulty swallowing the tablet whole please discuss with your pharmacist how to take the medication safely.  Take Eliquis exactly as prescribed and DO NOT stop taking Eliquis without talking to the doctor who prescribed the medication.  Stopping may increase your risk of developing a new blood clot.  Refill your prescription before you run out.  After discharge, you should have regular check-up appointments with your healthcare provider that is prescribing your Eliquis.    What do you do if you miss a dose? If a dose of ELIQUIS is not taken at the scheduled time, take it as soon as possible on the same day and twice-daily administration should be resumed. The dose should not be doubled to make up for a missed dose.  Important Safety Information A possible side effect of Eliquis is bleeding. You should call your healthcare provider right away if you experience any of the following: Bleeding from an injury or your nose that does not stop. Unusual colored urine (red or dark brown) or unusual colored stools (red or black). Unusual bruising for unknown reasons. A serious fall or if you hit your head (even if there is no bleeding).  Some  medicines may interact with Eliquis and might increase your risk of bleeding or clotting while on Eliquis. To help avoid this, consult your healthcare provider or pharmacist prior to using any new prescription or non-prescription medications, including herbals, vitamins, non-steroidal anti-inflammatory drugs (NSAIDs) and supplements.  This website has more information on Eliquis (apixaban): http://www.eliquis.com/eliquis/home

## 2022-04-20 NOTE — Progress Notes (Signed)
PROGRESS NOTE    Curtis Bell  JHE:[id number] DOB: [birth year] DOA: 04/18/2022 PCP: Rogers Blocker, MD  Curtis Bell is a 71/M with history of COPD, prostate cancer,?  CHF on Lasix, hypertension presented to the ED with left-sided pleuritic chest pain, cough and shortness of breath, symptoms started about a week ago. -Patient reports having chronic swelling, uses Lasix regularly ED Course: Tachypneic, vital signs stable, troponin 7, CT chest with bilateral PE, atelectasis, small pleural effusion   Subjective: -Feels better, chest pain and breathing is improving  Assessment and Plan:  Acute pulmonary embolism (HCC)   -Treated with IV heparin yesterday, changed to Eliquis today   -Risk factors include known history of prostate cancer, has chronic lung nodules   -FU 2D echocardiogram   Suspected diastolic CHF -FU 2D echo, continue IV Lasix today -Monitor I's/O, daily weights, BMP in a.m.     Hypertension -Stable, diuretics as above, hold ARB     COPD (chronic obstructive pulmonary disease) (HCC) -Stable continue nebs as needed     Pulmonary nodules/lesions, multiple -Follow-up with pulmonary   Prostate cancer -Follow-up with oncology   = DVT prophylaxis: IV heparin Code Status: Full code Family Communication: None present Disposition Plan: Home likely 1-2 days  Consultants:    Procedures:   Antimicrobials:    Objective: Vitals:   04/19/22 1645 04/19/22 1950 04/20/22 0004 04/20/22 0500  BP:  120/70 122/73 114/69  Pulse: 79 72 76 70  Resp:  '16 20 16  '$ Temp:  97.6 F (36.4 C) 97.6 F (36.4 C) 97.9 F (36.6 C)  TempSrc:  Oral Oral Oral  SpO2: 91% 92% 90% 94%  Weight:      Height:        Intake/Output Summary (Last 24 hours) at 04/20/2022 1137 Last data filed at 04/19/2022 2000 Gross per 24 hour  Intake 342.36 ml  Output --  Net 342.36 ml   Filed Weights   04/18/22 1952 04/18/22 2257 04/19/22 1555  Weight: 104.3 kg 104.3 kg 104.4 kg     Examination:  General exam: Obese appears calm and comfortable  Respiratory system: Few basilar rales Cardiovascular system: S1 & S2 heard, RRR.  Abd: nondistended, soft and nontender.Normal bowel sounds heard. Central nervous system: Alert and oriented. No focal neurological deficits. Extremities: Trace edema Skin: No rashes Psychiatry:  Mood & affect appropriate.     Data Reviewed:   CBC: Recent Labs  Lab 04/18/22 2000 04/19/22 1745 04/20/22 0155  WBC 7.9 6.3 6.2  NEUTROABS 5.8  --   --   HGB 15.5 14.6 13.7  HCT 47.6 44.4 41.1  MCV 90.7 91.0 89.7  PLT 253 241 185   Basic Metabolic Panel: Recent Labs  Lab 04/18/22 2000 04/20/22 0747  NA 138 136  K 4.1 4.2  CL 103 101  CO2 26 27  GLUCOSE 104* 116*  BUN 8 10  CREATININE 0.87 0.83  CALCIUM 8.7* 8.4*   GFR: Estimated Creatinine Clearance: 92.6 mL/min (by C-G formula based on SCr of 0.83 mg/dL). Liver Function Tests: Recent Labs  Lab 04/18/22 2000  AST 17  ALT 19  ALKPHOS 86  BILITOT 0.6  PROT 7.5  ALBUMIN 3.8   No results for input(s): "LIPASE", "AMYLASE" in the last 168 hours. No results for input(s): "AMMONIA" in the last 168 hours. Coagulation Profile: No results for input(s): "INR", "PROTIME" in the last 168 hours. Cardiac Enzymes: No results for input(s): "CKTOTAL", "CKMB", "CKMBINDEX", "TROPONINI" in the last 168 hours. BNP (last  3 results) No results for input(s): "PROBNP" in the last 8760 hours. HbA1C: No results for input(s): "HGBA1C" in the last 72 hours. CBG: No results for input(s): "GLUCAP" in the last 168 hours. Lipid Profile: No results for input(s): "CHOL", "HDL", "LDLCALC", "TRIG", "CHOLHDL", "LDLDIRECT" in the last 72 hours. Thyroid Function Tests: No results for input(s): "TSH", "T4TOTAL", "FREET4", "T3FREE", "THYROIDAB" in the last 72 hours. Anemia Panel: No results for input(s): "VITAMINB12", "FOLATE", "FERRITIN", "TIBC", "IRON", "RETICCTPCT" in the last 72 hours. Urine  analysis:    Component Value Date/Time   COLORURINE YELLOW 02/12/2022 0745   APPEARANCEUR CLEAR 02/12/2022 0745   LABSPEC 1.020 02/12/2022 0745   PHURINE 7.0 02/12/2022 0745   GLUCOSEU NEGATIVE 02/12/2022 0745   HGBUR NEGATIVE 02/12/2022 0745   BILIRUBINUR NEGATIVE 02/12/2022 0745   KETONESUR NEGATIVE 02/12/2022 0745   PROTEINUR NEGATIVE 02/12/2022 0745   UROBILINOGEN 0.2 03/20/2013 0048   NITRITE NEGATIVE 02/12/2022 0745   LEUKOCYTESUR NEGATIVE 02/12/2022 0745   Sepsis Labs: '@LABRCNTIP'$ (procalcitonin:4,lacticidven:4)  ) Recent Results (from the past 240 hour(s))  Resp panel by RT-PCR (RSV, Flu A&B, Covid) Anterior Nasal Swab     Status: None   Collection Time: 04/18/22  3:58 PM   Specimen: Anterior Nasal Swab  Result Value Ref Range Status   SARS Coronavirus 2 by RT PCR NEGATIVE NEGATIVE Final    Comment: (NOTE) SARS-CoV-2 target nucleic acids are NOT DETECTED.  The SARS-CoV-2 RNA is generally detectable in upper respiratory specimens during the acute phase of infection. The lowest concentration of SARS-CoV-2 viral copies this assay can detect is 138 copies/mL. A negative result does not preclude SARS-Cov-2 infection and should not be used as the sole basis for treatment or other patient management decisions. A negative result may occur with  improper specimen collection/handling, submission of specimen other than nasopharyngeal swab, presence of viral mutation(s) within the areas targeted by this assay, and inadequate number of viral copies(<138 copies/mL). A negative result must be combined with clinical observations, patient history, and epidemiological information. The expected result is Negative.  Fact Sheet for Patients:  EntrepreneurPulse.com.au  Fact Sheet for Healthcare Providers:  IncredibleEmployment.be  This test is no t yet approved or cleared by the [place] FDA and  has been authorized for detection and/or  diagnosis of SARS-CoV-2 by FDA under an Emergency Use Authorization (EUA). This EUA will remain  in effect (meaning this test can be used) for the duration of the COVID-19 declaration under Section 564(b)(1) of the Act, 21 U.S.C.section 360bbb-3(b)(1), unless the authorization is terminated  or revoked sooner.       Influenza A by PCR NEGATIVE NEGATIVE Final   Influenza B by PCR NEGATIVE NEGATIVE Final    Comment: (NOTE) The Xpert Xpress SARS-CoV-2/FLU/RSV plus assay is intended as an aid in the diagnosis of influenza from Nasopharyngeal swab specimens and should not be used as a sole basis for treatment. Nasal washings and aspirates are unacceptable for Xpert Xpress SARS-CoV-2/FLU/RSV testing.  Fact Sheet for Patients: EntrepreneurPulse.com.au  Fact Sheet for Healthcare Providers: IncredibleEmployment.be  This test is not yet approved or cleared by the [place] FDA and has been authorized for detection and/or diagnosis of SARS-CoV-2 by FDA under an Emergency Use Authorization (EUA). This EUA will remain in effect (meaning this test can be used) for the duration of the COVID-19 declaration under Section 564(b)(1) of the Act, 21 U.S.C. section 360bbb-3(b)(1), unless the authorization is terminated or revoked.     Resp Syncytial Virus by PCR NEGATIVE NEGATIVE Final  Comment: (NOTE) Fact Sheet for Patients: EntrepreneurPulse.com.au  Fact Sheet for Healthcare Providers: IncredibleEmployment.be  This test is not yet approved or cleared by the [place] FDA and has been authorized for detection and/or diagnosis of SARS-CoV-2 by FDA under an Emergency Use Authorization (EUA). This EUA will remain in effect (meaning this test can be used) for the duration of the COVID-19 declaration under Section 564(b)(1) of the Act, 21 U.S.C. section 360bbb-3(b)(1), unless the authorization is terminated  or revoked.  Performed at [facility], [place] [address], [place], [place] [address]      Radiology Studies: CT Angio Chest Pulmonary Embolism (PE) W or WO Contrast  Result Date: 04/19/2022 CLINICAL DATA:  Pulmonary embolism suspected EXAM: CT ANGIOGRAPHY CHEST WITH CONTRAST TECHNIQUE: Multidetector CT imaging of the chest was performed using the standard protocol during bolus administration of intravenous contrast. Multiplanar CT image reconstructions and MIPs were obtained to evaluate the vascular anatomy. RADIATION DOSE REDUCTION: This exam was performed according to the departmental dose-optimization program which includes automated exposure control, adjustment of the mA and/or kV according to patient size and/or use of iterative reconstruction technique. CONTRAST:  3m OMNIPAQUE IOHEXOL 350 MG/ML SOLN COMPARISON:  10/06/2019 FINDINGS: Cardiovascular: Satisfactory opacification of the pulmonary arteries to the segmental level. Segmental pulmonary embolism with occlusion in the left lower lobe affecting the posterior basal segment. Nonocclusive embolus in the superior segment right lower lobe. RV to LV ratio measures 0.8 to 1; the clot burden is not highly suggestive of right heart strain. Main pulmonary artery measures 3.5 cm, similar to 2021 and suggesting chronic pulmonary hypertension Mediastinum/Nodes: No adenopathy or mass. Lungs/Pleura: Small left pleural effusion. Multi segment atelectasis at the lung bases. There could be superimposed pulmonary ischemia in the left lower lobe. Suspect mild emphysema. Upper Abdomen: No acute finding Musculoskeletal: No acute finding Review of the MIP images confirms the above findings. Critical Value/emergent results were called by telephone at the time of interpretation on 04/19/2022 at 5:54 am to [facility], who verbally acknowledged these results. IMPRESSION: 1. Acute pulmonary embolism affecting bilateral segmental branches, occlusive  in the left lower lobe where there is likely pulmonary ischemia. Stable heart size when compared to 2021. 2. Dependent opacities primarily attributed atelectasis with small left pleural effusion. Electronically Signed   By: JJorje GuildM.D.   On: 04/19/2022 05:55   DG Chest 2 View  Result Date: 04/18/2022 CLINICAL DATA:  Chest pain, productive cough EXAM: CHEST - 2 VIEW COMPARISON:  Chest radiograph dated July 23, 2020. FINDINGS: The heart is mildly enlarged atherosclerotic calcification of the aortic arch right lung is clear. Left basilar opacity suggesting atelectasis or infiltrate. No acute osseous abnormality bilateral glenohumeral osteoarthritis. IMPRESSION: Left basilar opacity suggesting atelectasis or infiltrate. Follow-up examination to resolution is recommended. Electronically Signed   By: IKeane PoliceD.O.   On: 04/18/2022 12:15     Scheduled Meds:  carvedilol  12.5 mg Oral BID WC   erythromycin   Both Eyes BID   famotidine  20 mg Oral Daily   loratadine  10 mg Oral Daily   memantine  10 mg Oral BID   mometasone-formoterol  2 puff Inhalation BID   montelukast  10 mg Oral Daily   pantoprazole  40 mg Oral Daily   rosuvastatin  10 mg Oral Daily   tamsulosin  0.4 mg Oral Daily   Continuous Infusions:  heparin 1,600 Units/hr (04/20/22 0817)     LOS: 1 day  Time spent: 59mn    PDomenic Polite MD [facility]   04/20/2022, 11:37 AM

## 2022-04-20 NOTE — Progress Notes (Signed)
ANTICOAGULATION CONSULT NOTE - Follow-up Note  Pharmacy Consult for Heparin Indication: pulmonary embolus  No Known Allergies  Patient Measurements: Height: '5\' 7"'$  (170.2 cm) Weight: 104.4 kg (230 lb 3.2 oz) IBW/kg (Calculated) : 66.1 Heparin Dosing Weight: 89kg  Vital Signs: Temp: 97.9 F (36.6 C) (01/21 0500) Temp Source: Oral (01/21 0500) BP: 114/69 (01/21 0500) Pulse Rate: 70 (01/21 0500)  Labs: Recent Labs    04/18/22 2000 04/19/22 0610 04/19/22 0815 04/19/22 1500 04/19/22 1745 04/20/22 0155  HGB 15.5  --   --   --  14.6 13.7  HCT 47.6  --   --   --  44.4 41.1  PLT 253  --   --   --  241 222  HEPARINUNFRC  --   --  0.59 0.51  --  0.58  CREATININE 0.87  --   --   --   --   --   TROPONINIHS  --  7  --   --   --   --      Estimated Creatinine Clearance: 88.4 mL/min (by C-G formula based on SCr of 0.87 mg/dL).   Medical History: Past Medical History:  Diagnosis Date   Biochemically recurrent malignant neoplasm of prostate ([place]) 04/20/2019   Chronic fatigue syndrome    Chronic pansinusitis 04/13/2017   COPD (chronic obstructive pulmonary disease) ([place])    Deviated nasal septum 06/04/2017   Hypertension    Laryngopharyngeal reflux (LPR) 01/24/2019   Osteoporosis    Other emphysema 08/31/2017   Prostate cancer     Medications:  Medications Prior to Admission  Medication Sig Dispense Refill Last Dose   acetaminophen (TYLENOL) 325 MG tablet Take 650 mg by mouth every 6 (six) hours as needed for moderate pain.   Past Week   albuterol (VENTOLIN HFA) 108 (90 Base) MCG/ACT inhaler Inhale 1-2 puffs into the lungs every 6 (six) hours as needed for wheezing or shortness of breath. Patient has run out of this medication   Past Week   amLODipine-olmesartan (AZOR) 10-40 MG tablet Take 1 tablet by mouth daily.   04/18/2022   aspirin 81 MG tablet Take 81 mg by mouth daily.     04/18/2022   carvedilol (COREG) 12.5 MG tablet 12.5 mg. twice daily  3 04/18/2022 at 8am    cetirizine (ZYRTEC) 10 MG tablet Take 10 mg by mouth daily.   04/18/2022   ergocalciferol (VITAMIN D2) 1.25 MG (50000 UT) capsule Take 50,000 Units by mouth once a week.   Past Week   famotidine (PEPCID) 40 MG tablet Take 20 mg by mouth daily.   Past Week   fluticasone-salmeterol (WIXELA INHUB) 250-50 MCG/ACT AEPB Inhale 1 puff into the lungs at bedtime.   Past Week   furosemide (LASIX) 40 MG tablet Take 20 mg by mouth daily.   Past Week   guaifenesin (HUMIBID E) 400 MG TABS tablet Take 400 mg by mouth every 6 (six) hours as needed (mucus and cough).   Past Week   ipratropium (ATROVENT) 0.06 % nasal spray Place 2 sprays into both nostrils 4 (four) times daily.   04/18/2022   meloxicam (MOBIC) 7.5 MG tablet Take 7.5 mg by mouth daily as needed for pain.   Past Week   memantine (NAMENDA) 10 MG tablet Take 1 tablet (10 mg total) by mouth 2 (two) times daily. 180 tablet 3 Past Week   montelukast (SINGULAIR) 10 MG tablet Take 10 mg by mouth daily.   Past Week   Multiple Vitamin (  MULTIVITAMIN) tablet Take 1 tablet by mouth daily.   04/18/2022   Multiple Vitamins-Minerals (PRESERVISION AREDS 2 PO) Take 1 capsule by mouth 2 (two) times daily.   04/18/2022   nitroGLYCERIN (NITROSTAT) 0.4 MG SL tablet 1 under tongue every 5 mins x3 if needed for chest pain  3 unk   pantoprazole (PROTONIX) 40 MG tablet Take 40 mg by mouth daily.   04/18/2022   rosuvastatin (CRESTOR) 20 MG tablet Take 10 mg by mouth daily.   Past Week   tamsulosin (FLOMAX) 0.4 MG CAPS capsule Take 0.4 mg by mouth daily. Not taking. Ran out of the medication.   Past Week   budesonide-formoterol (SYMBICORT) 160-4.5 MCG/ACT inhaler Inhale 2 puffs into the lungs 2 (two) times daily. (Patient not taking: Reported on 04/19/2022) 1 Inhaler 3 Not Taking   fluticasone (FLONASE) 50 MCG/ACT nasal spray Place 2 sprays into both nostrils daily. (Patient not taking: Reported on 04/19/2022) 29.7 g 1 Not Taking   ondansetron (ZOFRAN-ODT) 4 MG disintegrating tablet  Take 1 tablet (4 mg total) by mouth every 8 (eight) hours as needed for nausea or vomiting. (Patient not taking: Reported on 04/19/2022) 12 tablet 0 Not Taking   Respiratory Therapy Supplies (FLUTTER) DEVI Use as directed. (Patient not taking: Reported on 04/19/2022) 1 each 0 Not Taking    Scheduled:   carvedilol  12.5 mg Oral BID WC   erythromycin   Both Eyes BID   famotidine  20 mg Oral Daily   loratadine  10 mg Oral Daily   memantine  10 mg Oral BID   mometasone-formoterol  2 puff Inhalation BID   montelukast  10 mg Oral Daily   pantoprazole  40 mg Oral Daily   rosuvastatin  10 mg Oral Daily   tamsulosin  0.4 mg Oral Daily   Infusions:   heparin 1,600 Units/hr (04/20/22 0817)   PRN: acetaminophen, albuterol, guaiFENesin, HYDROcodone-acetaminophen, HYDROmorphone (DILAUDID) injection, ipratropium-albuterol, ondansetron  Assessment: 42 yoM Pt sent by PCP for a confirmed bilateral PE. Pharmacy to start heparin gtt. Recent visit to GI clinic for positive FIT test, but patient not aware of any melena or hematochezia (concern for Adenomatous colon polyps)   Hgb and platelets wnl. No issues with infusion or bleeding per RN. Pharmacy now consulted to transition from IV heparin to oral apixaban.   Goal of Therapy:  Monitor platelets by anticoagulation protocol: Yes   Plan:  Start apixaban 10 mg BID x7 days, then 5 mg bid  Stop IV heparin at the same time as DOAC administration  Continue to monitor H&H and platelets Monitor for s/sx of bleeding   Francena Hanly, PharmD Pharmacy Resident  04/20/2022 8:18 AM

## 2022-04-21 ENCOUNTER — Other Ambulatory Visit (HOSPITAL_COMMUNITY): Payer: Self-pay

## 2022-04-21 DIAGNOSIS — I2699 Other pulmonary embolism without acute cor pulmonale: Secondary | ICD-10-CM | POA: Diagnosis not present

## 2022-04-21 LAB — BASIC METABOLIC PANEL
Anion gap: 7 (ref 5–15)
BUN: 12 mg/dL (ref 8–23)
CO2: 30 mmol/L (ref 22–32)
Calcium: 8.6 mg/dL — ABNORMAL LOW (ref 8.9–10.3)
Chloride: 101 mmol/L (ref 98–111)
Creatinine, Ser: 0.89 mg/dL (ref 0.61–1.24)
GFR, Estimated: >60 mL/min (ref >60)
Glucose, Bld: 116 mg/dL — ABNORMAL HIGH (ref 70–99)
Potassium: 4.2 mmol/L (ref 3.5–5.1)
Sodium: 138 mmol/L (ref 135–145)

## 2022-04-21 LAB — CBC
HCT: 43 % (ref 39.0–52.0)
Hemoglobin: 14.1 g/dL (ref 13.0–17.0)
MCH: 29.5 pg (ref 26.0–34.0)
MCHC: 32.8 g/dL (ref 30.0–36.0)
MCV: 90 fL (ref 80.0–100.0)
Platelets: 240 10*3/uL (ref 150–400)
RBC: 4.78 MIL/uL (ref 4.22–5.81)
RDW: 12.4 % (ref 11.5–15.5)
WBC: 5.4 10*3/uL (ref 4.0–10.5)
nRBC: 0 % (ref 0.0–0.2)

## 2022-04-21 MED ORDER — APIXABAN (ELIQUIS) VTE STARTER PACK (10MG AND 5MG): ORAL_TABLET | ORAL | 0 refills | Status: DC

## 2022-04-21 MED ORDER — FUROSEMIDE 40 MG PO TABS: 40.0000 mg | ORAL_TABLET | Freq: Every day | ORAL | 0 refills | Status: AC

## 2022-04-21 NOTE — TOC Benefit Eligibility Note (Signed)
Patient Teacher, English as a foreign language completed.    The patient is currently admitted and upon discharge could be taking Eliquis 5 mg.  The current 30 day co-pay is $47.00.   The patient is insured through [place], [place] Patient Advocate Specialist [place] Patient Advocate Team Direct Number: [phone]  Fax: [phone]

## 2022-04-21 NOTE — Care Management (Signed)
  Transition of Care [facility]) Screening Note   Patient Details  Name: Curtis Bell Date of Birth: [birth year]   Transition of Care [facility]) CM/SW Contact:    Bethena Roys, RN Phone Number: 04/21/2022, 11:02 AM    Transition of Care Department [facility]) has reviewed the patient. Patient is a member of the [place]. Patient states he gets some of his medications from the [place]. Patient is aware to contact the [facility] to see if they have Eliquis on Formulary and to check his cost via the [place]. Patient's Eliquis Starter pack has been sent to [facility] and patient states he has transportation to the pharmacy. No further needs identified at this time.

## 2022-04-21 NOTE — Progress Notes (Signed)
Heart Failure Navigator Progress Note  Assessed for Heart & Vascular TOC clinic readiness.  Patient EF 55-60%, will follow up with Dr. Terrence Dupont.   Navigator available for reassessment of patient.   Earnestine Leys, BSN, Clinical cytogeneticist Only

## 2022-04-21 NOTE — Discharge Summary (Signed)
Physician Discharge Summary  Curtis Bell BTD:[id number] DOB: [birth year] DOA: 04/18/2022  PCP: Rogers Blocker, MD  Admit date: 04/18/2022 Discharge date: 04/21/2022  Time spent: 45 minutes  Recommendations for Outpatient Follow-up:  PCP in 1 week, please check BMP at follow-up Cardiology Dr. Terrence Dupont in 1 month   Discharge Diagnoses:  Principal Problem:   Acute pulmonary embolism ([place]) Acute on chronic diastolic CHF   Hypertension   COPD (chronic obstructive pulmonary disease) ([place])   Pulmonary nodules/lesions, multiple   Pulmonary embolism (HCC)   CHF (congestive heart failure) ([place])   Discharge Condition: Improved  Diet recommendation: Low-sodium  Filed Weights   04/18/22 1952 04/18/22 2257 04/19/22 1555  Weight: 104.3 kg 104.3 kg 104.4 kg    History of present illness:  Curtis Bell is a 72/M with history of COPD, prostate cancer,?  CHF on Lasix, hypertension presented to the ED with left-sided pleuritic chest pain, cough and shortness of breath, symptoms started about a week ago. -Patient reports having chronic swelling, uses Lasix regularly ED Course: Tachypneic, vital signs stable, troponin 7, CT chest with bilateral PE, atelectasis, small pleural effusion  Hospital Course:   Acute pulmonary embolism (HCC)   -Treated with IV heparin on admission, switched over to Eliquis yesterday -Continue Eliquis for at least 3 to 6 months, possibly long-term   -Risk factors include known history of prostate cancer, has chronic lung nodules   -2D echo with preserved EF, mild LVH, RV is normal -Discharged home in stable condition, follow-up with PCP in 1 week   Acute on chronic diastolic CHF -2D echo with preserved EF, mild LVH -On low-dose Lasix at baseline, diuresed with IV Lasix this admission, transition to oral Lasix 40 Mg daily at discharge -Advised to follow-up with Dr. Terrence Dupont, further GDMT as tolerated at follow-up     Hypertension -Stable, diuretics as above,  resume ARB     COPD (chronic obstructive pulmonary disease) ([place]) -Stable continue nebs as needed     Pulmonary nodules/lesions, multiple -Follow-up with pulmonary   Prostate cancer -Follow-up with oncology  Discharge Exam: Vitals:   04/21/22 0816 04/21/22 1031  BP:  125/78  Pulse:  69  Resp:    Temp:    SpO2: 97%    General exam: Obese appears calm and comfortable  Respiratory system: poor air movt Cardiovascular system: S1 & S2 heard, RRR.  Abd: nondistended, soft and nontender.Normal bowel sounds heard. Central nervous system: Alert and oriented. No focal neurological deficits. Extremities: Trace edema Skin: No rashes Psychiatry:  Mood & affect appropriate.     Discharge Instructions   Discharge Instructions     Diet - low sodium heart healthy   Complete by: As directed    Increase activity slowly   Complete by: As directed       Allergies as of 04/21/2022   No Known Allergies      Medication List     STOP taking these medications    meloxicam 7.5 MG tablet Commonly known as: MOBIC   ondansetron 4 MG disintegrating tablet Commonly known as: ZOFRAN-ODT       TAKE these medications    acetaminophen 325 MG tablet Commonly known as: TYLENOL Take 650 mg by mouth every 6 (six) hours as needed for moderate pain.   albuterol 108 (90 Base) MCG/ACT inhaler Commonly known as: VENTOLIN HFA Inhale 1-2 puffs into the lungs every 6 (six) hours as needed for wheezing or shortness of breath. Patient has run out of this medication  amLODipine-olmesartan 10-40 MG tablet Commonly known as: AZOR Take 1 tablet by mouth daily.   Apixaban Starter Pack ('10mg'$  and '5mg'$ ) Commonly known as: ELIQUIS STARTER PACK Take as directed on package: start with two-'5mg'$  tablets twice daily for 7 days. On day 8, switch to one-'5mg'$  tablet twice daily.   aspirin 81 MG tablet Take 81 mg by mouth daily.   budesonide-formoterol 160-4.5 MCG/ACT inhaler Commonly known as:  Symbicort Inhale 2 puffs into the lungs 2 (two) times daily.   carvedilol 12.5 MG tablet Commonly known as: COREG 12.5 mg. twice daily   cetirizine 10 MG tablet Commonly known as: ZYRTEC Take 10 mg by mouth daily.   ergocalciferol 1.25 MG (50000 UT) capsule Commonly known as: VITAMIN D2 Take 50,000 Units by mouth once a week.   famotidine 40 MG tablet Commonly known as: PEPCID Take 20 mg by mouth daily.   fluticasone 50 MCG/ACT nasal spray Commonly known as: FLONASE Place 2 sprays into both nostrils daily.   Flutter Devi Use as directed.   furosemide 40 MG tablet Commonly known as: LASIX Take 1 tablet (40 mg total) by mouth daily. What changed: how much to take   guaifenesin 400 MG Tabs tablet Commonly known as: HUMIBID E Take 400 mg by mouth every 6 (six) hours as needed (mucus and cough).   ipratropium 0.06 % nasal spray Commonly known as: ATROVENT Place 2 sprays into both nostrils 4 (four) times daily.   memantine 10 MG tablet Commonly known as: NAMENDA Take 1 tablet (10 mg total) by mouth 2 (two) times daily.   montelukast 10 MG tablet Commonly known as: SINGULAIR Take 10 mg by mouth daily.   multivitamin tablet Take 1 tablet by mouth daily.   nitroGLYCERIN 0.4 MG SL tablet Commonly known as: NITROSTAT 1 under tongue every 5 mins x3 if needed for chest pain   pantoprazole 40 MG tablet Commonly known as: PROTONIX Take 40 mg by mouth daily.   PRESERVISION AREDS 2 PO Take 1 capsule by mouth 2 (two) times daily.   rosuvastatin 20 MG tablet Commonly known as: CRESTOR Take 10 mg by mouth daily.   tamsulosin 0.4 MG Caps capsule Commonly known as: FLOMAX Take 0.4 mg by mouth daily. Not taking. Ran out of the medication.   Wixela Inhub 250-50 MCG/ACT Aepb Generic drug: fluticasone-salmeterol Inhale 1 puff into the lungs at bedtime.       No Known Allergies  Follow-up Information     Rogers Blocker, MD. Schedule an appointment as soon as possible  for a visit in 1 week(s).   Specialty: Internal Medicine Contact information: [place] [address] [phone]                  The results of significant diagnostics from this hospitalization (including imaging, microbiology, ancillary and laboratory) are listed below for reference.    Significant Diagnostic Studies: ECHOCARDIOGRAM COMPLETE  Result Date: 04/20/2022    ECHOCARDIOGRAM REPORT   Patient Name:   EINO WHITNER Date of Exam: 04/19/2022 Medical Rec #:  [id number]       Height:       67.0 in Accession #:    [id number]      Weight:       230.2 lb Date of Birth:  1950-06-30        BSA:          2.147 m Patient Age:    72 years  BP:           128/75 mmHg Patient Gender: M               HR:           70 bpm. Exam Location:  Inpatient Procedure: 2D Echo Indications:    CHF  History:        Patient has no prior history of Echocardiogram examinations.                 CHF, COPD; Risk Factors:Hypertension.  Sonographer:    Harvie Junior Referring Phys: [place]  Sonographer Comments: Technically difficult study due to poor echo windows. Image acquisition challenging due to COPD. IMPRESSIONS  1. Left ventricular ejection fraction, by estimation, is 55 to 60%. Left ventricular ejection fraction by 3D volume is 58 %. The left ventricle has normal function. The left ventricle has no regional wall motion abnormalities. There is mild left ventricular hypertrophy. Left ventricular diastolic parameters were normal.  2. Right ventricular systolic function is normal. The right ventricular size is normal. There is normal pulmonary artery systolic pressure. The estimated right ventricular systolic pressure is 27.0 mmHg.  3. The mitral valve is normal in structure. Trivial mitral valve regurgitation.  4. The aortic valve is tricuspid. Aortic valve regurgitation is not visualized. No aortic stenosis is present.  5. The inferior vena cava is normal in size with  greater than 50% respiratory variability, suggesting right atrial pressure of 3 mmHg. FINDINGS  Left Ventricle: Left ventricular ejection fraction, by estimation, is 55 to 60%. Left ventricular ejection fraction by 3D volume is 58 %. The left ventricle has normal function. The left ventricle has no regional wall motion abnormalities. The left ventricular internal cavity size was normal in size. There is mild left ventricular hypertrophy. Left ventricular diastolic parameters were normal. Right Ventricle: The right ventricular size is normal. No increase in right ventricular wall thickness. Right ventricular systolic function is normal. There is normal pulmonary artery systolic pressure. The tricuspid regurgitant velocity is 2.31 m/s, and  with an assumed right atrial pressure of 3 mmHg, the estimated right ventricular systolic pressure is 62.3 mmHg. Left Atrium: Left atrial size was normal in size. Right Atrium: Right atrial size was normal in size. Pericardium: Trivial pericardial effusion is present. Mitral Valve: The mitral valve is normal in structure. Trivial mitral valve regurgitation. Tricuspid Valve: The tricuspid valve is normal in structure. Tricuspid valve regurgitation is trivial. Aortic Valve: The aortic valve is tricuspid. Aortic valve regurgitation is not visualized. No aortic stenosis is present. Aortic valve mean gradient measures 4.0 mmHg. Aortic valve peak gradient measures 6.9 mmHg. Aortic valve area, by VTI measures 2.49 cm. Pulmonic Valve: The pulmonic valve was not well visualized. Pulmonic valve regurgitation is not visualized. Aorta: The aortic root and ascending aorta are structurally normal, with no evidence of dilitation. Venous: The inferior vena cava is normal in size with greater than 50% respiratory variability, suggesting right atrial pressure of 3 mmHg. IAS/Shunts: The interatrial septum was not well visualized.  LEFT VENTRICLE PLAX 2D LVIDd:         3.80 cm         Diastology  LVIDs:         2.70 cm         LV e' medial:    5.00 cm/s LV PW:         1.10 cm         LV E/e' medial:  13.0 LV IVS:        1.10 cm         LV e' lateral:   9.79 cm/s LVOT diam:     2.00 cm         LV E/e' lateral: 6.6 LV SV:         66 LV SV Index:   31 LVOT Area:     3.14 cm        3D Volume EF                                LV 3D EF:    Left                                             ventricul LV Volumes (MOD)                            ar LV vol d, MOD    93.1 ml                    ejection A2C:                                        fraction LV vol d, MOD    105.0 ml                   by 3D A4C:                                        volume is LV vol s, MOD    30.5 ml                    58 %. A2C: LV vol s, MOD    46.8 ml A4C:                           3D Volume EF: LV SV MOD A2C:   62.6 ml       3D EF:        58 % LV SV MOD A4C:   105.0 ml      LV EDV:       148 ml LV SV MOD BP:    58.8 ml       LV ESV:       63 ml                                LV SV:        85 ml RIGHT VENTRICLE RV Basal diam:  3.70 cm RV Mid diam:    3.40 cm RV S prime:     16.40 cm/s TAPSE (M-mode): 1.8 cm LEFT ATRIUM             Index        RIGHT ATRIUM           Index LA diam:        3.50 cm 1.63 cm/m  RA Area:     17.90 cm LA Vol (A2C):   59.4 ml 27.67 ml/m  RA Volume:   49.90 ml  23.24 ml/m LA Vol (A4C):   55.3 ml 25.76 ml/m LA Biplane Vol: 57.3 ml 26.69 ml/m  AORTIC VALVE                    PULMONIC VALVE AV Area (Vmax):    2.24 cm     PV Vmax:          1.18 m/s AV Area (Vmean):   2.22 cm     PV Peak grad:     5.6 mmHg AV Area (VTI):     2.49 cm     PR End Diast Vel: 8.29 msec AV Vmax:           131.00 cm/s AV Vmean:          85.200 cm/s AV VTI:            0.266 m AV Peak Grad:      6.9 mmHg AV Mean Grad:      4.0 mmHg LVOT Vmax:         93.30 cm/s LVOT Vmean:        60.100 cm/s LVOT VTI:          0.211 m LVOT/AV VTI ratio: 0.79  AORTA Ao Root diam: 3.40 cm Ao Asc diam:  3.30 cm MITRAL VALVE               TRICUSPID  VALVE MV Area (PHT): 2.80 cm    TR Peak grad:   21.3 mmHg MV Decel Time: 271 msec    TR Vmax:        231.00 cm/s MR Peak grad: 31.2 mmHg MR Vmax:      279.50 cm/s  SHUNTS MV E velocity: 65.00 cm/s  Systemic VTI:  0.21 m MV A velocity: 72.20 cm/s  Systemic Diam: 2.00 cm MV E/A ratio:  0.90 Oswaldo Milian MD Electronically signed by Oswaldo Milian MD Signature Date/Time: 04/20/2022/12:48:30 PM    Final    CT Angio Chest Pulmonary Embolism (PE) W or WO Contrast  Result Date: 04/19/2022 CLINICAL DATA:  Pulmonary embolism suspected EXAM: CT ANGIOGRAPHY CHEST WITH CONTRAST TECHNIQUE: Multidetector CT imaging of the chest was performed using the standard protocol during bolus administration of intravenous contrast. Multiplanar CT image reconstructions and MIPs were obtained to evaluate the vascular anatomy. RADIATION DOSE REDUCTION: This exam was performed according to the departmental dose-optimization program which includes automated exposure control, adjustment of the mA and/or kV according to patient size and/or use of iterative reconstruction technique. CONTRAST:  29m OMNIPAQUE IOHEXOL 350 MG/ML SOLN COMPARISON:  10/06/2019 FINDINGS: Cardiovascular: Satisfactory opacification of the pulmonary arteries to the segmental level. Segmental pulmonary embolism with occlusion in the left lower lobe affecting the posterior basal segment. Nonocclusive embolus in the superior segment right lower lobe. RV to LV ratio measures 0.8 to 1; the clot burden is not highly suggestive of right heart strain. Main pulmonary artery measures 3.5 cm, similar to 2021 and suggesting chronic pulmonary hypertension Mediastinum/Nodes: No adenopathy or mass. Lungs/Pleura: Small left pleural effusion. Multi segment atelectasis at the lung bases. There could be superimposed pulmonary ischemia in the left lower lobe. Suspect mild emphysema. Upper Abdomen: No acute finding Musculoskeletal: No acute finding Review of the MIP images  confirms the above findings. Critical Value/emergent results were called by telephone at the time of interpretation on 04/19/2022 at 5:54 am  to provider [facility] , who verbally acknowledged these results. IMPRESSION: 1. Acute pulmonary embolism affecting bilateral segmental branches, occlusive in the left lower lobe where there is likely pulmonary ischemia. Stable heart size when compared to 2021. 2. Dependent opacities primarily attributed atelectasis with small left pleural effusion. Electronically Signed   By: Jorje Guild M.D.   On: 04/19/2022 05:55   DG Chest 2 View  Result Date: 04/18/2022 CLINICAL DATA:  Chest pain, productive cough EXAM: CHEST - 2 VIEW COMPARISON:  Chest radiograph dated July 23, 2020. FINDINGS: The heart is mildly enlarged atherosclerotic calcification of the aortic arch right lung is clear. Left basilar opacity suggesting atelectasis or infiltrate. No acute osseous abnormality bilateral glenohumeral osteoarthritis. IMPRESSION: Left basilar opacity suggesting atelectasis or infiltrate. Follow-up examination to resolution is recommended. Electronically Signed   By: Keane Police D.O.   On: 04/18/2022 12:15    Microbiology: Recent Results (from the past 240 hour(s))  Resp panel by RT-PCR (RSV, Flu A&B, Covid) Anterior Nasal Swab     Status: None   Collection Time: 04/18/22  3:58 PM   Specimen: Anterior Nasal Swab  Result Value Ref Range Status   SARS Coronavirus 2 by RT PCR NEGATIVE NEGATIVE Final    Comment: (NOTE) SARS-CoV-2 target nucleic acids are NOT DETECTED.  The SARS-CoV-2 RNA is generally detectable in upper respiratory specimens during the acute phase of infection. The lowest concentration of SARS-CoV-2 viral copies this assay can detect is 138 copies/mL. A negative result does not preclude SARS-Cov-2 infection and should not be used as the sole basis for treatment or other patient management decisions. A negative result may occur with  improper  specimen collection/handling, submission of specimen other than nasopharyngeal swab, presence of viral mutation(s) within the areas targeted by this assay, and inadequate number of viral copies(<138 copies/mL). A negative result must be combined with clinical observations, patient history, and epidemiological information. The expected result is Negative.  Fact Sheet for Patients:  EntrepreneurPulse.com.au  Fact Sheet for Healthcare Providers:  IncredibleEmployment.be  This test is no t yet approved or cleared by the [place] FDA and  has been authorized for detection and/or diagnosis of SARS-CoV-2 by FDA under an Emergency Use Authorization (EUA). This EUA will remain  in effect (meaning this test can be used) for the duration of the COVID-19 declaration under Section 564(b)(1) of the Act, 21 U.S.C.section 360bbb-3(b)(1), unless the authorization is terminated  or revoked sooner.       Influenza A by PCR NEGATIVE NEGATIVE Final   Influenza B by PCR NEGATIVE NEGATIVE Final    Comment: (NOTE) The Xpert Xpress SARS-CoV-2/FLU/RSV plus assay is intended as an aid in the diagnosis of influenza from Nasopharyngeal swab specimens and should not be used as a sole basis for treatment. Nasal washings and aspirates are unacceptable for Xpert Xpress SARS-CoV-2/FLU/RSV testing.  Fact Sheet for Patients: EntrepreneurPulse.com.au  Fact Sheet for Healthcare Providers: IncredibleEmployment.be  This test is not yet approved or cleared by the [place] FDA and has been authorized for detection and/or diagnosis of SARS-CoV-2 by FDA under an Emergency Use Authorization (EUA). This EUA will remain in effect (meaning this test can be used) for the duration of the COVID-19 declaration under Section 564(b)(1) of the Act, 21 U.S.C. section 360bbb-3(b)(1), unless the authorization is terminated or revoked.     Resp  Syncytial Virus by PCR NEGATIVE NEGATIVE Final    Comment: (NOTE) Fact Sheet for Patients: EntrepreneurPulse.com.au  Fact Sheet for Healthcare Providers: IncredibleEmployment.be  This test is not yet approved or cleared by the [place] and has been authorized for detection and/or diagnosis of SARS-CoV-2 by FDA under an Emergency Use Authorization (EUA). This EUA will remain in effect (meaning this test can be used) for the duration of the COVID-19 declaration under Section 564(b)(1) of the Act, 21 U.S.C. section 360bbb-3(b)(1), unless the authorization is terminated or revoked.  Performed at [facility], [place] [address], [place], [place] [address]      Labs: Basic Metabolic Panel: Recent Labs  Lab 04/18/22 2000 04/20/22 0747 04/21/22 0130  NA 138 136 138  K 4.1 4.2 4.2  CL 103 101 101  CO2 '26 27 30  '$ GLUCOSE 104* 116* 116*  BUN '8 10 12  '$ CREATININE 0.87 0.83 0.89  CALCIUM 8.7* 8.4* 8.6*   Liver Function Tests: Recent Labs  Lab 04/18/22 2000  AST 17  ALT 19  ALKPHOS 86  BILITOT 0.6  PROT 7.5  ALBUMIN 3.8   No results for input(s): "LIPASE", "AMYLASE" in the last 168 hours. No results for input(s): "AMMONIA" in the last 168 hours. CBC: Recent Labs  Lab 04/18/22 2000 04/19/22 1745 04/20/22 0155 04/21/22 0130  WBC 7.9 6.3 6.2 5.4  NEUTROABS 5.8  --   --   --   HGB 15.5 14.6 13.7 14.1  HCT 47.6 44.4 41.1 43.0  MCV 90.7 91.0 89.7 90.0  PLT 253 241 222 240   Cardiac Enzymes: No results for input(s): "CKTOTAL", "CKMB", "CKMBINDEX", "TROPONINI" in the last 168 hours. BNP: BNP (last 3 results) No results for input(s): "BNP" in the last 8760 hours.  ProBNP (last 3 results) No results for input(s): "PROBNP" in the last 8760 hours.  CBG: No results for input(s): "GLUCAP" in the last 168 hours.     Signed:  Domenic Polite MD.  [facility] 04/21/2022, 1:40 PM

## 2022-05-06 ENCOUNTER — Telehealth: Payer: Self-pay | Admitting: Hematology

## 2022-05-06 NOTE — Telephone Encounter (Signed)
Scheduled appt per 2/5 referral. Pt is aware of appt date and time. Pt is aware to arrive 15 mins prior to appt time and to bring and updated insurance card. Pt is aware of appt location.   

## 2022-05-09 ENCOUNTER — Telehealth: Payer: Self-pay | Admitting: Gastroenterology

## 2022-05-09 NOTE — Telephone Encounter (Signed)
Amy, what's your thoughts? Please advise.

## 2022-05-09 NOTE — Telephone Encounter (Signed)
And since he was recently placed on one doesn't that mean his procedure would need to be pushed back?

## 2022-05-09 NOTE — Telephone Encounter (Signed)
Curtis Bell this patient was seen by Amy, I was not aware that patient had been scheduled for procedure. Did we send clearance when patient was here at visit?

## 2022-05-09 NOTE — Telephone Encounter (Signed)
Patient came back into the office stating that he was recently hospitalized and was put on new medication. Also eliquis.

## 2022-05-09 NOTE — Telephone Encounter (Signed)
Patient was seen 1-16 and he wasn't on a blood thinner at the time. He was recently placed on one. So we will need a clearance so that me or you now? Want to help me out? And should this be sent to Amy as well?

## 2022-05-13 NOTE — Telephone Encounter (Signed)
Curtis Bell made an appointment for 3-21 with you. Did you want me to go and call him and move his appointment to April or May? I will go ahead and cancel his appointment for the procedure though

## 2022-05-14 NOTE — Telephone Encounter (Signed)
Spoke to patient due to being placed on BT I have cancelled his Colon in March and OV and has scheduled him with Dr Rush Landmark 07-16-2022  I mailed a letter with time and date and reasons why. He said the [place] told him that he needed to see about an EGD as well with the colon. Told him that he can talk about that with his office visit because now that he is on a blood thinner it changes when he can come off it for the procedure

## 2022-05-30 ENCOUNTER — Other Ambulatory Visit: Payer: Self-pay

## 2022-05-30 ENCOUNTER — Telehealth: Payer: Self-pay

## 2022-05-30 ENCOUNTER — Encounter: Payer: Self-pay | Admitting: Hematology

## 2022-05-30 ENCOUNTER — Inpatient Hospital Stay: Payer: No Typology Code available for payment source | Attending: Hematology | Admitting: Hematology

## 2022-05-30 VITALS — BP 125/65 | HR 60 | Temp 98.0°F | Resp 19 | Ht 67.0 in | Wt 229.8 lb

## 2022-05-30 DIAGNOSIS — I82503 Chronic embolism and thrombosis of unspecified deep veins of lower extremity, bilateral: Secondary | ICD-10-CM | POA: Diagnosis not present

## 2022-05-30 DIAGNOSIS — Z8042 Family history of malignant neoplasm of prostate: Secondary | ICD-10-CM | POA: Insufficient documentation

## 2022-05-30 DIAGNOSIS — Z8546 Personal history of malignant neoplasm of prostate: Secondary | ICD-10-CM | POA: Diagnosis not present

## 2022-05-30 DIAGNOSIS — I2699 Other pulmonary embolism without acute cor pulmonale: Secondary | ICD-10-CM | POA: Insufficient documentation

## 2022-05-30 DIAGNOSIS — Z87891 Personal history of nicotine dependence: Secondary | ICD-10-CM | POA: Diagnosis not present

## 2022-05-30 DIAGNOSIS — R918 Other nonspecific abnormal finding of lung field: Secondary | ICD-10-CM

## 2022-05-30 NOTE — Telephone Encounter (Signed)
Spoke with Larkin Ina in Medical Records with [facility] [phone]).  Requested Dr. Gilford Rile' last office note, labs, and diagnostic reports be faxed to Dr. Ernestina Penna office.  Larkin Ina stated that the pt was last seen in September 2023 and he would fax the office note and labs from that time.  Provided Larkin Ina with Dr. Ernestina Penna office fax.  Larkin Ina stated he will fax the requested documents.  Awaiting fax.  Fax received and given to Dr. Burr Medico.

## 2022-05-30 NOTE — Progress Notes (Signed)
[name]   [phone] Fax:([phone]) [name] Note   Patient Care Team: Rogers Blocker, MD as PCP - General (Internal Medicine) Ceasar Mons, MD as Consulting Physician (Urology) Cameron Sprang, MD as Consulting Physician (Neurology) Tyler Pita, MD as Consulting Physician (Radiation Oncology) Cira Rue, RN Nurse Navigator as Registered Nurse (Medical Oncology) 05/30/2022  CHIEF COMPLAINTS/PURPOSE OF CONSULTATION:  PE  HISTORY OF PRESENTING ILLNESS:  Curtis Bell 72 y.o. male is here because of PE. Pt present to the clinic alone.  He was referred by his primary care physician at the [place].    Pt stated he had left pain from his left shoulder all the way down to his left rib cage in January 2024.  Pt denied that he travel or any injury or surgery prior to that. Pt went to his doctor an had an Xray done and he went for a scan and then was told he need to go to the hospital.  Patient was admitted to hospital on April 18, 2022 for bilateral acute PE in segmental branches, occlusive in the left lower lobe where there is likely pulmonary ischemia.  No right heart strain.  He has chronic bilateral lower extremity DVT, no worse when he had a PE.  Doppler of lower extremity was not performed.  He was treated with heparin infusion in the hospital, and changed to Eliquis on discharge.  He has been tolerating Eliquis very well, no bleeding or other noticeable side effects.   He denies recent  history of trauma,  long distance travel, dehydration, recent surgery, smoking or prolonged immobilization.  He was physically active person before the episode of PE, but has been less active since hospital discharge.  He still functions very well at home.  He had diagnosis of Prostate cancer.  It was initially diagnosed in 2003, with Gleason score of 7, status post RRP in 2003.  He subsequently developed biochemical recurrence 2 years later, underwent  radiation, and intermittent ADT therapy.  His PSA was in the range 2-3 during 20 17-20 20, partially related to intermittent ADT therapy.  His PSA level has dropped to undetectable since April 2021 after he restarted ADT.  He was last seen by urologist Dr. Lovena [facility] in [facility] in September 2023.  He had a multiple CT scan and bone scan in the past few years which were negative for radiographic evidence of metastasis.   MEDICAL HISTORY:  Past Medical History:  Diagnosis Date   Biochemically recurrent malignant neoplasm of prostate ([place]) 04/20/2019   Chronic fatigue syndrome    Chronic pansinusitis 04/13/2017   COPD (chronic obstructive pulmonary disease) (HCC)    Deviated nasal septum 06/04/2017   Hypertension    Laryngopharyngeal reflux (LPR) 01/24/2019   Osteoporosis    Other emphysema 08/31/2017   Prostate cancer     SURGICAL HISTORY: Past Surgical History:  Procedure Laterality Date   ANTERIOR CRUCIATE LIGAMENT REPAIR     CATARACT EXTRACTION     HERNIA REPAIR     KNEE SURGERY     PROSTATE SURGERY      SOCIAL HISTORY: Social History   Socioeconomic History   Marital status: Married    Spouse name: Curtis Bell   Number of children: 2   Years of education: 12   Highest education level: High school graduate  Occupational History    Comment: retired  Tobacco Use   Smoking status: Former    Packs/day: 0.75  Years: 20.00    Total pack years: 15.00    Types: Cigarettes    Quit date: 03/31/1982    Years since quitting: 40.1   Smokeless tobacco: Never  Vaping Use   Vaping Use: Never used  Substance and Sexual Activity   Alcohol use: No   Drug use: No   Sexual activity: Not Currently  Other Topics Concern   Not on file  Social History Narrative   Left handed      Highest level of edu- HS      One story home with wife and son   Social Determinants of Health   Financial Resource Strain: Not on file  Food Insecurity: Not on file  Transportation Needs: Not on  file  Physical Activity: Not on file  Stress: Not on file  Social Connections: Not on file  Intimate Partner Violence: Not on file    FAMILY HISTORY: Family History  Problem Relation Age of Onset   Memory loss Mother        Marena Chancy if ever formally diagnosed with dementia   Prostate cancer Father        lived to be 32 years old   Lung disease Father    Cataracts Father    Amblyopia Neg Hx    Blindness Neg Hx    Glaucoma Neg Hx    Macular degeneration Neg Hx    Retinal detachment Neg Hx    Strabismus Neg Hx    Retinitis pigmentosa Neg Hx    Breast cancer Neg Hx    Colon cancer Neg Hx    Pancreatic cancer Neg Hx     ALLERGIES:  has No Known Allergies.  MEDICATIONS:  Current Outpatient Medications  Medication Sig Dispense Refill   acetaminophen (TYLENOL) 325 MG tablet Take 650 mg by mouth every 6 (six) hours as needed for moderate pain.     albuterol (VENTOLIN HFA) 108 (90 Base) MCG/ACT inhaler Inhale 1-2 puffs into the lungs every 6 (six) hours as needed for wheezing or shortness of breath. Patient has run out of this medication     amLODipine-olmesartan (AZOR) 10-40 MG tablet Take 1 tablet by mouth daily.     APIXABAN (ELIQUIS) VTE STARTER PACK ('10MG'$  AND '5MG'$ ) Take as directed on package: start with two-'5mg'$  tablets twice daily for 7 days. On day 8, switch to one-'5mg'$  tablet twice daily. 1 each 0   aspirin 81 MG tablet Take 81 mg by mouth daily.       budesonide-formoterol (SYMBICORT) 160-4.5 MCG/ACT inhaler Inhale 2 puffs into the lungs 2 (two) times daily. (Patient not taking: Reported on 04/19/2022) 1 Inhaler 3   carvedilol (COREG) 12.5 MG tablet 12.5 mg. twice daily  3   cetirizine (ZYRTEC) 10 MG tablet Take 10 mg by mouth daily.     ergocalciferol (VITAMIN D2) 1.25 MG (50000 UT) capsule Take 50,000 Units by mouth once a week.     famotidine (PEPCID) 40 MG tablet Take 20 mg by mouth daily.     fluticasone (FLONASE) 50 MCG/ACT nasal spray Place 2 sprays into both nostrils  daily. (Patient not taking: Reported on 04/19/2022) 29.7 g 1   fluticasone-salmeterol (WIXELA INHUB) 250-50 MCG/ACT AEPB Inhale 1 puff into the lungs at bedtime.     furosemide (LASIX) 40 MG tablet Take 1 tablet (40 mg total) by mouth daily. 30 tablet 0   guaifenesin (HUMIBID E) 400 MG TABS tablet Take 400 mg by mouth every 6 (six) hours as needed (mucus and cough).  ipratropium (ATROVENT) 0.06 % nasal spray Place 2 sprays into both nostrils 4 (four) times daily.     memantine (NAMENDA) 10 MG tablet Take 1 tablet (10 mg total) by mouth 2 (two) times daily. 180 tablet 3   montelukast (SINGULAIR) 10 MG tablet Take 10 mg by mouth daily.     Multiple Vitamin (MULTIVITAMIN) tablet Take 1 tablet by mouth daily.     Multiple Vitamins-Minerals (PRESERVISION AREDS 2 PO) Take 1 capsule by mouth 2 (two) times daily.     nitroGLYCERIN (NITROSTAT) 0.4 MG SL tablet 1 under tongue every 5 mins x3 if needed for chest pain  3   pantoprazole (PROTONIX) 40 MG tablet Take 40 mg by mouth daily.     Respiratory Therapy Supplies (FLUTTER) DEVI Use as directed. (Patient not taking: Reported on 04/19/2022) 1 each 0   rosuvastatin (CRESTOR) 20 MG tablet Take 10 mg by mouth daily.     tamsulosin (FLOMAX) 0.4 MG CAPS capsule Take 0.4 mg by mouth daily. Not taking. Ran out of the medication.     No current facility-administered medications for this visit.    REVIEW OF SYSTEMS:   Constitutional: Denies fevers, chills or abnormal night sweats Eyes: Denies blurriness of vision, double vision or watery eyes Ears, nose, mouth, throat, and face: Denies mucositis or sore throat Respiratory: Denies cough, dyspnea or wheezes Cardiovascular: Denies palpitation, chest discomfort or lower extremity swelling Gastrointestinal:  Denies nausea, heartburn or change in bowel habits Skin: Denies abnormal skin rashes Lymphatics: Denies new lymphadenopathy or easy bruising Neurological:Denies numbness, tingling or new  weaknesses Behavioral/Psych: Mood is stable, no new changes  All other systems were reviewed with the patient and are negative.  PHYSICAL EXAMINATION: ECOG PERFORMANCE STATUS: 0 - Asymptomatic  Vitals:   05/30/22 1542  BP: 125/65  Pulse: 60  Resp: 19  Temp: 98 F (36.7 C)  SpO2: 95%   Filed Weights   05/30/22 1542  Weight: 229 lb 12.8 oz (104.2 kg)      NECK:(-)  supple, thyroid normal size, non-tender, without nodularity LYMPH:  (-)no palpable lymphadenopathy in the cervical, axillary or inguinal LUNGS: (-) clear to auscultation and percussion with normal breathing effort HEART: (-) regular rate & rhythm and no murmurs and no lower extremity edema ABDOMEN:(-) abdomen soft, non-tender and normal bowel sounds   LABORATORY DATA:  I have reviewed the data as listed Recent Results (from the past 2160 hour(s))  Resp panel by RT-PCR (RSV, Flu A&B, Covid) Anterior Nasal Swab     Status: None   Collection Time: 04/18/22  3:58 PM   Specimen: Anterior Nasal Swab  Result Value Ref Range   SARS Coronavirus 2 by RT PCR NEGATIVE NEGATIVE    Comment: (NOTE) SARS-CoV-2 target nucleic acids are NOT DETECTED.  The SARS-CoV-2 RNA is generally detectable in upper respiratory specimens during the acute phase of infection. The lowest concentration of SARS-CoV-2 viral copies this assay can detect is 138 copies/mL. A negative result does not preclude SARS-Cov-2 infection and should not be used as the sole basis for treatment or other patient management decisions. A negative result may occur with  improper specimen collection/handling, submission of specimen other than nasopharyngeal swab, presence of viral mutation(s) within the areas targeted by this assay, and inadequate number of viral copies(<138 copies/mL). A negative result must be combined with clinical observations, patient history, and epidemiological information. The expected result is Negative.  Fact Sheet for Patients:   EntrepreneurPulse.com.au  Fact Sheet for Healthcare Providers:  IncredibleEmployment.be  This test is no t yet approved or cleared by the [place] and  has been authorized for detection and/or diagnosis of SARS-CoV-2 by FDA under an Emergency Use Authorization (EUA). This EUA will remain  in effect (meaning this test can be used) for the duration of the COVID-19 declaration under Section 564(b)(1) of the Act, 21 U.S.C.section 360bbb-3(b)(1), unless the authorization is terminated  or revoked sooner.       Influenza A by PCR NEGATIVE NEGATIVE   Influenza B by PCR NEGATIVE NEGATIVE    Comment: (NOTE) The Xpert Xpress SARS-CoV-2/FLU/RSV plus assay is intended as an aid in the diagnosis of influenza from Nasopharyngeal swab specimens and should not be used as a sole basis for treatment. Nasal washings and aspirates are unacceptable for Xpert Xpress SARS-CoV-2/FLU/RSV testing.  Fact Sheet for Patients: EntrepreneurPulse.com.au  Fact Sheet for Healthcare Providers: IncredibleEmployment.be  This test is not yet approved or cleared by the [place] FDA and has been authorized for detection and/or diagnosis of SARS-CoV-2 by FDA under an Emergency Use Authorization (EUA). This EUA will remain in effect (meaning this test can be used) for the duration of the COVID-19 declaration under Section 564(b)(1) of the Act, 21 U.S.C. section 360bbb-3(b)(1), unless the authorization is terminated or revoked.     Resp Syncytial Virus by PCR NEGATIVE NEGATIVE    Comment: (NOTE) Fact Sheet for Patients: EntrepreneurPulse.com.au  Fact Sheet for Healthcare Providers: IncredibleEmployment.be  This test is not yet approved or cleared by the [place] FDA and has been authorized for detection and/or diagnosis of SARS-CoV-2 by FDA under an Emergency Use Authorization (EUA).  This EUA will remain in effect (meaning this test can be used) for the duration of the COVID-19 declaration under Section 564(b)(1) of the Act, 21 U.S.C. section 360bbb-3(b)(1), unless the authorization is terminated or revoked.  Performed at [facility], [place] [address], [place], [place] [address]   Comprehensive metabolic panel     Status: Abnormal   Collection Time: 04/18/22  8:00 PM  Result Value Ref Range   Sodium 138 135 - 145 mmol/L   Potassium 4.1 3.5 - 5.1 mmol/L   Chloride 103 98 - 111 mmol/L   CO2 26 22 - 32 mmol/L   Glucose, Bld 104 (H) 70 - 99 mg/dL    Comment: Glucose reference range applies only to samples taken after fasting for at least 8 hours.   BUN 8 8 - 23 mg/dL   Creatinine, Ser 0.87 0.61 - 1.24 mg/dL   Calcium 8.7 (L) 8.9 - 10.3 mg/dL   Total Protein 7.5 6.5 - 8.1 g/dL   Albumin 3.8 3.5 - 5.0 g/dL   AST 17 15 - 41 U/L   ALT 19 0 - 44 U/L   Alkaline Phosphatase 86 38 - 126 U/L   Total Bilirubin 0.6 0.3 - 1.2 mg/dL   GFR, Estimated >60 >60 mL/min    Comment: (NOTE) Calculated using the CKD-EPI Creatinine Equation (2021)    Anion gap 9 5 - 15    Comment: Performed at [facility] [address], [place], [place] [address]  CBC with Differential     Status: None   Collection Time: 04/18/22  8:00 PM  Result Value Ref Range   WBC 7.9 4.0 - 10.5 K/uL   RBC 5.25 4.22 - 5.81 MIL/uL   Hemoglobin 15.5 13.0 - 17.0 g/dL   HCT 47.6 39.0 - 52.0 %   MCV 90.7 80.0 -  100.0 fL   MCH 29.5 26.0 - 34.0 pg   MCHC 32.6 30.0 - 36.0 g/dL   RDW 12.8 11.5 - 15.5 %   Platelets 253 150 - 400 K/uL   nRBC 0.0 0.0 - 0.2 %   Neutrophils Relative % 74 %   Neutro Abs 5.8 1.7 - 7.7 K/uL   Lymphocytes Relative 10 %   Lymphs Abs 0.8 0.7 - 4.0 K/uL   Monocytes Relative 11 %   Monocytes Absolute 0.8 0.1 - 1.0 K/uL   Eosinophils Relative 4 %   Eosinophils Absolute 0.3 0.0 - 0.5 K/uL   Basophils Relative 0 %   Basophils Absolute 0.0 0.0 - 0.1 K/uL   Immature  Granulocytes 1 %   Abs Immature Granulocytes 0.04 0.00 - 0.07 K/uL    Comment: Performed at [facility] [address], [place], [place] [address]  Troponin I (High Sensitivity)     Status: None   Collection Time: 04/19/22  6:10 AM  Result Value Ref Range   Troponin I (High Sensitivity) 7 <18 ng/L    Comment: (NOTE) Elevated high sensitivity troponin I (hsTnI) values and significant  changes across serial measurements may suggest ACS but many other  chronic and acute conditions are known to elevate hsTnI results.  Refer to the "Links" section for chest pain algorithms and additional  guidance. Performed at [facility], [place] [address], [place], [place] [address]   Heparin level (unfractionated)     Status: None   Collection Time: 04/19/22  8:15 AM  Result Value Ref Range   Heparin Unfractionated 0.59 0.30 - 0.70 IU/mL    Comment: (NOTE) The clinical reportable range upper limit is being lowered to >1.10 to align with the FDA approved guidance for the current laboratory assay.  If heparin results are below expected values, and patient dosage has  been confirmed, suggest follow up testing of antithrombin III levels. Performed at [facility], [place] [address], [place], [place] [address]   Heparin level (unfractionated)     Status: None   Collection Time: 04/19/22  3:00 PM  Result Value Ref Range   Heparin Unfractionated 0.51 0.30 - 0.70 IU/mL    Comment: (NOTE) The clinical reportable range upper limit is being lowered to >1.10 to align with the FDA approved guidance for the current laboratory assay.  If heparin results are below expected values, and patient dosage has  been confirmed, suggest follow up testing of antithrombin III levels. Performed at [facility], [place] [address], [place] [address]   CBC     Status: None   Collection Time: 04/19/22  5:45 PM  Result Value Ref Range   WBC 6.3 4.0 - 10.5 K/uL   RBC 4.88 4.22 - 5.81 MIL/uL    Hemoglobin 14.6 13.0 - 17.0 g/dL   HCT 44.4 39.0 - 52.0 %   MCV 91.0 80.0 - 100.0 fL   MCH 29.9 26.0 - 34.0 pg   MCHC 32.9 30.0 - 36.0 g/dL   RDW 12.8 11.5 - 15.5 %   Platelets 241 150 - 400 K/uL   nRBC 0.0 0.0 - 0.2 %    Comment: Performed at [facility], [place] [address], [place], [place] [address]  ECHOCARDIOGRAM COMPLETE     Status: None   Collection Time: 04/19/22  5:55 PM  Result Value Ref Range   Weight 3,683.2 oz   Height 67 in   BP 128/75 mmHg  Single Plane A2C EF 67.2 %   Single Plane A4C EF 55.4 %   Calc EF 59.0 %   S' Lateral 2.70 cm   AR max vel 2.24 cm2   AV Area VTI 2.49 cm2   AV Mean grad 4.0 mmHg   AV Peak grad 6.9 mmHg   Ao pk vel 1.31 m/s   Area-P 1/2 2.80 cm2   MV M vel 2.80 m/s   AV Area mean vel 2.22 cm2   MV Peak grad 31.2 mmHg   Est EF 55 - 60%   Heparin level (unfractionated)     Status: None   Collection Time: 04/20/22  1:55 AM  Result Value Ref Range   Heparin Unfractionated 0.58 0.30 - 0.70 IU/mL    Comment: (NOTE) The clinical reportable range upper limit is being lowered to >1.10 to align with the FDA approved guidance for the current laboratory assay.  If heparin results are below expected values, and patient dosage has  been confirmed, suggest follow up testing of antithrombin III levels. Performed at [facility], [place] [address], [place] [address]   CBC     Status: None   Collection Time: 04/20/22  1:55 AM  Result Value Ref Range   WBC 6.2 4.0 - 10.5 K/uL   RBC 4.58 4.22 - 5.81 MIL/uL   Hemoglobin 13.7 13.0 - 17.0 g/dL   HCT 41.1 39.0 - 52.0 %   MCV 89.7 80.0 - 100.0 fL   MCH 29.9 26.0 - 34.0 pg   MCHC 33.3 30.0 - 36.0 g/dL   RDW 12.7 11.5 - 15.5 %   Platelets 222 150 - 400 K/uL   nRBC 0.0 0.0 - 0.2 %    Comment: Performed at [facility], [place] [address] [place], [place] [id number]  Basic metabolic panel     Status: Abnormal   Collection Time: 04/20/22  7:47 AM  Result Value Ref Range   Sodium 136  135 - 145 mmol/L   Potassium 4.2 3.5 - 5.1 mmol/L   Chloride 101 98 - 111 mmol/L   CO2 27 22 - 32 mmol/L   Glucose, Bld 116 (H) 70 - 99 mg/dL    Comment: Glucose reference range applies only to samples taken after fasting for at least 8 hours.   BUN 10 8 - 23 mg/dL   Creatinine, Ser 0.83 0.61 - 1.24 mg/dL   Calcium 8.4 (L) 8.9 - 10.3 mg/dL   GFR, Estimated >60 >60 mL/min    Comment: (NOTE) Calculated using the CKD-EPI Creatinine Equation (2021)    Anion gap 8 5 - 15    Comment: Performed at [facility] [address], [place] [address]  CBC     Status: None   Collection Time: 04/21/22  1:30 AM  Result Value Ref Range   WBC 5.4 4.0 - 10.5 K/uL   RBC 4.78 4.22 - 5.81 MIL/uL   Hemoglobin 14.1 13.0 - 17.0 g/dL   HCT 43.0 39.0 - 52.0 %   MCV 90.0 80.0 - 100.0 fL   MCH 29.5 26.0 - 34.0 pg   MCHC 32.8 30.0 - 36.0 g/dL   RDW 12.4 11.5 - 15.5 %   Platelets 240 150 - 400 K/uL   nRBC 0.0 0.0 - 0.2 %    Comment: Performed at [facility], [place] [address], [place], [place] [id number]  Basic metabolic panel     Status: Abnormal   Collection Time:  04/21/22  1:30 AM  Result Value Ref Range   Sodium 138 135 - 145 mmol/L   Potassium 4.2 3.5 - 5.1 mmol/L   Chloride 101 98 - 111 mmol/L   CO2 30 22 - 32 mmol/L   Glucose, Bld 116 (H) 70 - 99 mg/dL    Comment: Glucose reference range applies only to samples taken after fasting for at least 8 hours.   BUN 12 8 - 23 mg/dL   Creatinine, Ser 0.89 0.61 - 1.24 mg/dL   Calcium 8.6 (L) 8.9 - 10.3 mg/dL   GFR, Estimated >60 >60 mL/min    Comment: (NOTE) Calculated using the CKD-EPI Creatinine Equation (2021)    Anion gap 7 5 - 15    Comment: Performed at [facility] [address] [place], [place] [address]    RADIOGRAPHIC STUDIES: I have personally reviewed the radiological images as listed and agreed with the findings in the report. No results found.  ASSESSMENT:   Bilateral segmental PE in 03/2022  PLAN:  This  is his first episode of thrombosis, unprovoked.  Hypercoagulopathy workup We discussed about the pros and cons about testing for thrombophilia disorder. his current anticoagulation therapy will interfere with some the tests and it is not possible to interpret the test results.  Taking him off the anticoagulation therapy to do the tests may precipitate another thrombotic event.  Given his advanced age, lack of family history of thrombosis, and the likely the cause of thrombosis is his underlying prostate cancer, I do not recommend hypercoagulopathy workup.  Duration of anticoagulation Giving his underlying prostate cancer (biochemical relapse, no radiographic distant metastasis), I recommend continue anticoagulation indefinitely.    Anticoagulation options We discussed about various options of anticoagulation therapies including warfarin, low molecular weight heparin such as Lovenox or newer agents such as Xarelto or Eliquis. Some of the risks and benefits discussed including costs involved, the need for monitoring, risks of life-threatening bleeding/hospitalization, reversibility of each agent in the event of bleeding or overdose, safety profile of each drug and taking into account other social issues such as ease of administration of medications, etc. Ultimately, we have made an informed decision for the patient to continue his treatment with Eliquis, I recommend to monitor renal function when he is on the medicine.  Other preventive strategy for DVT  I recommend the patient to use elastic compression stockings at 20-30 mmHg to reduce risks of chronic thrombophlebitis.  Finally, at the end of our consultation today, I reinforced the importance of preventive strategies such as avoiding hormonal supplement, avoiding cigarette smoking, keeping up-to-date with screening programs for early cancer detection, frequent ambulation for long distance travel and aggressive DVT prophylaxis in all surgical  settings.  Pan -I recommend lifelong anticoagulation due to his biochemical relapse of prostate cancer -He will continue Eliquis, monitor renal function every 6 to 12 months -He will follow-up with his PCP, I will see him as needed in the future.  All questions were answered. The patient knows to call the clinic with any problems, questions or concerns. I spent 35 minutes counseling the patient face to face. The total time spent in the appointment was 45 minutes and more than 50% was on counseling.     Truitt Merle, MD 05/30/2022 4:19 PM

## 2022-06-02 NOTE — Progress Notes (Signed)
Per verbal order from Dr. Burr Medico, faxed referring provider Shella Spearing PA-C with the Dept of [facility], Dr. Ernestina Penna office note from the initial consult.  Fax confirmation received.  [phone]

## 2022-06-03 ENCOUNTER — Encounter: Payer: Self-pay | Admitting: Hematology

## 2022-06-17 ENCOUNTER — Encounter: Payer: No Typology Code available for payment source | Admitting: Gastroenterology

## 2022-06-19 ENCOUNTER — Ambulatory Visit: Payer: No Typology Code available for payment source | Admitting: Physician Assistant

## 2022-07-07 ENCOUNTER — Other Ambulatory Visit: Payer: Self-pay

## 2022-07-07 ENCOUNTER — Encounter (HOSPITAL_COMMUNITY): Payer: Self-pay

## 2022-07-07 ENCOUNTER — Emergency Department (HOSPITAL_COMMUNITY): Payer: No Typology Code available for payment source

## 2022-07-07 ENCOUNTER — Emergency Department (HOSPITAL_COMMUNITY)
Admission: EM | Admit: 2022-07-07 | Discharge: 2022-07-07 | Disposition: A | Discharge status: Home / Self Care | Payer: No Typology Code available for payment source | Attending: Emergency Medicine | Admitting: Emergency Medicine

## 2022-07-07 DIAGNOSIS — Z7901 Long term (current) use of anticoagulants: Secondary | ICD-10-CM | POA: Diagnosis not present

## 2022-07-07 DIAGNOSIS — Z79899 Other long term (current) drug therapy: Secondary | ICD-10-CM | POA: Diagnosis not present

## 2022-07-07 DIAGNOSIS — R1030 Lower abdominal pain, unspecified: Secondary | ICD-10-CM

## 2022-07-07 DIAGNOSIS — J449 Chronic obstructive pulmonary disease, unspecified: Secondary | ICD-10-CM | POA: Insufficient documentation

## 2022-07-07 DIAGNOSIS — I10 Essential (primary) hypertension: Secondary | ICD-10-CM | POA: Diagnosis not present

## 2022-07-07 DIAGNOSIS — R61 Generalized hyperhidrosis: Secondary | ICD-10-CM | POA: Insufficient documentation

## 2022-07-07 DIAGNOSIS — Z8546 Personal history of malignant neoplasm of prostate: Secondary | ICD-10-CM | POA: Insufficient documentation

## 2022-07-07 LAB — CBC WITH DIFFERENTIAL/PLATELET
Abs Immature Granulocytes: 0.02 10*3/uL (ref 0.00–0.07)
Basophils Absolute: 0 10*3/uL (ref 0.0–0.1)
Basophils Relative: 1 %
Eosinophils Absolute: 0.2 10*3/uL (ref 0.0–0.5)
Eosinophils Relative: 4 %
HCT: 44.2 % (ref 39.0–52.0)
Hemoglobin: 14.6 g/dL (ref 13.0–17.0)
Immature Granulocytes: 0 %
Lymphocytes Relative: 17 %
Lymphs Abs: 1 10*3/uL (ref 0.7–4.0)
MCH: 29.6 pg (ref 26.0–34.0)
MCHC: 33 g/dL (ref 30.0–36.0)
MCV: 89.5 fL (ref 80.0–100.0)
Monocytes Absolute: 0.5 10*3/uL (ref 0.1–1.0)
Monocytes Relative: 8 %
Neutro Abs: 4.1 10*3/uL (ref 1.7–7.7)
Neutrophils Relative %: 70 %
Platelets: 223 10*3/uL (ref 150–400)
RBC: 4.94 MIL/uL (ref 4.22–5.81)
RDW: 13 % (ref 11.5–15.5)
WBC: 5.9 10*3/uL (ref 4.0–10.5)
nRBC: 0 % (ref 0.0–0.2)

## 2022-07-07 LAB — COMPREHENSIVE METABOLIC PANEL
ALT: 23 U/L (ref 0–44)
AST: 20 U/L (ref 15–41)
Albumin: 3.5 g/dL (ref 3.5–5.0)
Alkaline Phosphatase: 69 U/L (ref 38–126)
Anion gap: 9 (ref 5–15)
BUN: 14 mg/dL (ref 8–23)
CO2: 28 mmol/L (ref 22–32)
Calcium: 8.8 mg/dL — ABNORMAL LOW (ref 8.9–10.3)
Chloride: 104 mmol/L (ref 98–111)
Creatinine, Ser: 1.01 mg/dL (ref 0.61–1.24)
GFR, Estimated: >60 mL/min (ref >60)
Glucose, Bld: 113 mg/dL — ABNORMAL HIGH (ref 70–99)
Potassium: 3.7 mmol/L (ref 3.5–5.1)
Sodium: 141 mmol/L (ref 135–145)
Total Bilirubin: 0.6 mg/dL (ref 0.3–1.2)
Total Protein: 6.8 g/dL (ref 6.5–8.1)

## 2022-07-07 LAB — URINALYSIS, ROUTINE W REFLEX MICROSCOPIC
Bilirubin Urine: NEGATIVE
Glucose, UA: NEGATIVE mg/dL
Hgb urine dipstick: NEGATIVE
Ketones, ur: NEGATIVE mg/dL
Leukocytes,Ua: NEGATIVE
Nitrite: NEGATIVE
Protein, ur: NEGATIVE mg/dL
Specific Gravity, Urine: 1.019 (ref 1.005–1.030)
pH: 6 (ref 5.0–8.0)

## 2022-07-07 LAB — LIPASE, BLOOD: Lipase: 36 U/L (ref 11–51)

## 2022-07-07 LAB — PROTIME-INR
INR: 1.2 (ref 0.8–1.2)
Prothrombin Time: 14.8 seconds (ref 11.4–15.2)

## 2022-07-07 LAB — POC OCCULT BLOOD, ED: Fecal Occult Bld: POSITIVE — AB

## 2022-07-07 MED ORDER — IOHEXOL 350 MG/ML SOLN
100.0000 mL | Freq: Once | INTRAVENOUS | Status: AC | PRN
  Administered 2022-07-07: 100 mL via INTRAVENOUS

## 2022-07-07 NOTE — ED Triage Notes (Signed)
Pt to ED by EMS from home c/o lower abdominal pain which began this morning. Pt reports dark stool. Original pulse was in the 30's, HR is now in the 50's following 650NS. Arrives A+O. MD at bedside.

## 2022-07-07 NOTE — ED Notes (Signed)
Patient transported to CT 

## 2022-07-07 NOTE — ED Provider Notes (Signed)
Care was taken over from Dr. Bebe Shaggy.  Patient presented with lower abdominal pain.  He did not have any testicular pain.  He was awaiting a CT of his abdomen pelvis.  This does not show any acute abnormalities.  There is some nonconcerning appearing renal and liver cyst per the radiology report.  His labs are reviewed and are nonconcerning.  His urinalysis is not consistent with infection.  He did have Hemoccult positive stool but did not have any visual blood or melena per Dr. Bebe Shaggy.  Patient is completely asymptomatic currently.  His abdominal exam is benign.  He was discharged home in good condition.  Was encouraged to have follow-up with his primary care doctor for recheck.  Was advised to keep an eye on his stool for any blood or black stools.  Return precautions were given.   Curtis Bucco, MD 07/07/22 1020

## 2022-07-07 NOTE — Discharge Instructions (Signed)
Follow-up with your primary care doctor.  Watch out for any blood in your stool or black stools.  Return to emergency room if you have any worsening symptoms.

## 2022-07-07 NOTE — ED Provider Notes (Signed)
[facility] [place] Provider [facility]   CSN: [id number] Arrival date & time: 07/07/22  0449     History  Chief Complaint  Patient presents with   Abdominal Pain    Doyce LooseRonald L Vaccaro is a 72 y.o. male.  The history is provided by the patient.  Abdominal Pain Pain location:  Suprapubic Pain quality: sharp   Pain radiates to:  Does not radiate Pain severity:  Moderate Onset quality:  Sudden Timing:  Constant Progression:  Improving Chronicity:  New Associated symptoms: no fever   Patient with history of prostate cancer, COPD, obesity presents with lower abdominal pain. Patient reports prior to arrival he started having sharp lower abdominal pain that started having dark stools that became loose.  Denies any bloody stool.  No vomiting.  He reports feeling dizzy and diaphoretic at the time.  No chest pain or shortness of breath.  EMS was called and they noted his pulse was in the 30s but this improved into the 50s after IV fluids  Patient reports he is now feeling improved.  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 Authorizing Provider  acetaminophen (TYLENOL) 325 MG tablet Take 650 mg by mouth every 6 (six) hours as needed for moderate pain.    [provider]  albuterol (VENTOLIN HFA) 108 (90 Base) MCG/ACT inhaler Inhale 1-2 puffs into the lungs every 6 (six) hours as needed for wheezing or shortness of breath. Patient has  run out of this medication    [provider]  amLODipine-olmesartan (AZOR) 10-40 MG tablet Take 1 tablet by mouth daily.    [provider]  APIXABAN Everlene Balls(ELIQUIS) VTE STARTER PACK (10MG  AND 5MG ) Take as directed on package: start with two-5mg  tablets twice daily for 7 days. On day 8, switch to one-5mg  tablet twice daily. 04/21/22   Zannie CoveJoseph, Preetha, MD  aspirin 81 MG tablet Take 81 mg by mouth daily.      [provider]  budesonide-formoterol (SYMBICORT) 160-4.5 MCG/ACT inhaler Inhale 2 puffs into the lungs 2 (two) times daily. Patient not taking: Reported on 04/19/2022 06/05/15   Chilton GreathouseMannam, Praveen, MD  carvedilol (COREG) 12.5 MG tablet 12.5 mg. twice daily 08/13/15   [provider]  cetirizine (ZYRTEC) 10 MG tablet Take 10 mg by mouth daily.    [provider]  ergocalciferol (VITAMIN D2) 1.25 MG (50000 UT) capsule Take 50,000 Units by mouth once a week.    [provider]  famotidine (PEPCID) 40 MG tablet Take 20 mg by mouth daily.    [provider]  fluticasone (FLONASE) 50 MCG/ACT nasal spray Place 2 sprays into both nostrils daily. Patient not taking: Reported on 04/19/2022 06/11/15   Chilton GreathouseMannam, Praveen, MD  fluticasone-salmeterol (WIXELA INHUB) 250-50 MCG/ACT AEPB Inhale 1 puff into the lungs at bedtime.    [provider]  furosemide (LASIX) 40 MG tablet Take 1 tablet (40 mg total) by mouth daily. 04/21/22   Zannie CoveJoseph, Preetha, MD  guaifenesin (HUMIBID E) 400 MG TABS tablet Take 400 mg by mouth every 6 (six) hours as needed (mucus and cough).    [provider]  ipratropium (ATROVENT) 0.06 % nasal spray Place 2 sprays into both nostrils 4 (four) times daily.    [provider]  memantine (NAMENDA) 10 MG tablet Take 1 tablet (10 mg total) by mouth 2 (two) times daily. 03/11/19   Van Clines, MD  montelukast (SINGULAIR) 10 MG tablet Take 10 mg by mouth daily. 06/08/19   [provider]  Multiple Vitamin  (MULTIVITAMIN) tablet Take 1 tablet by mouth daily.    [provider]  Multiple Vitamins-Minerals (PRESERVISION AREDS 2 PO) Take 1 capsule by mouth 2 (two) times daily.    [provider]  nitroGLYCERIN (NITROSTAT) 0.4 MG SL tablet 1 under tongue every 5 mins x3 if needed for chest pain 08/20/15   [provider]  pantoprazole (PROTONIX) 40 MG tablet Take 40 mg by mouth daily.    [provider]  Respiratory Therapy Supplies (FLUTTER) DEVI Use as directed. Patient not taking: Reported on 04/19/2022 08/30/15   Chilton Greathouse, MD  rosuvastatin (CRESTOR) 20 MG tablet Take 10 mg by mouth daily.    [provider]  tamsulosin (FLOMAX) 0.4 MG CAPS capsule Take 0.4 mg by mouth daily. Not taking. Ran out of the medication. 04/06/19   [provider]      Allergies    Patient has no known allergies.    Review of Systems   Review of Systems  Constitutional:  Positive for diaphoresis. Negative for fever.  Gastrointestinal:  Positive for abdominal pain.    Physical Exam Updated Vital Signs BP 127/77   Pulse (!) 49   Temp (!) 97 F (36.1 C) (Axillary)   Resp 18   Ht 1.702 m (5\' 7" )   Wt 101.2 kg   SpO2 97%   BMI 34.93 kg/m  Physical Exam CONSTITUTIONAL: Well developed/well nourished HEAD: Normocephalic/atraumatic EYES: EOMI/PERRL ENMT: Mucous membranes moist NECK: supple no meningeal signs SPINE/BACK:entire spine nontender CV: S1/S2 noted, no murmurs/rubs/gallops noted LUNGS: Lungs are clear to auscultation bilaterally, no apparent distress ABDOMEN: soft, obese, nontender, no rebound or guarding, bowel sounds noted throughout abdomen GU:no cva tenderness, no scrotal tenderness is noted, chaperone present Rectal-no blood or melena, Hemoccult positive, chaperone present NEURO: Pt is awake/alert/appropriate, moves all extremitiesx4.  No facial droop.   EXTREMITIES: pulses normal/equal, full ROM SKIN: warm, color normal PSYCH: no  abnormalities of mood noted, alert and oriented to situation  ED Results / Procedures / Treatments   Labs (all labs ordered are listed, but only abnormal results are displayed) Labs Reviewed  COMPREHENSIVE METABOLIC PANEL - Abnormal; Notable for the following components:      Result Value   Glucose, Bld 113 (*)    Calcium 8.8 (*)    All other components within normal limits  POC OCCULT BLOOD, ED - Abnormal; Notable for the following components:   Fecal Occult Bld POSITIVE (*)    All other components within normal limits  LIPASE, BLOOD  CBC WITH DIFFERENTIAL/PLATELET  PROTIME-INR  URINALYSIS, ROUTINE W REFLEX MICROSCOPIC    EKG EKG Interpretation  Date/Time:  Monday July 07 2022 05:07:16 EDT Ventricular Rate:  55 PR Interval:  199 QRS Duration: 91 QT Interval:  588 QTC Calculation: 563 R Axis:   85 Text Interpretation: Sinus rhythm Consider left atrial enlargement Borderline right axis deviation Anteroseptal infarct, old Nonspecific T abnormalities, lateral leads Interpretation limited  secondary to artifact Confirmed by Zadie Rhine ([id number]) on 07/07/2022 5:12:57 AM Prehospital EKG reviewed which reveals sinus bradycardia  Radiology No results found.  Procedures Procedures    Medications Ordered in ED Medications - No data to display  ED Course/ Medical Decision Making/ A&P Clinical Course as of 07/07/22 0657  Mon Jul 07, 2022  7092 Patient presented for lower abdominal pain that resulted in loose stool that he said was dark.  Overall he is improving.  However he does report some mild lower abdominal pain.  Given his age and previous history, will proceed with CT imaging.  There are conflicting signs of acute GI bleed as there was no melena or blood on exam, but was Hemoccult positive.  Will monitor for any signs of bleeding. [DW]  [phone] Signed out to dr Fredderick Phenix at shift change.  If CT imaging unremarkable and no new issues patient can be discharged [DW]    Clinical  Course User Index [DW] Zadie Rhine, MD                             Medical Decision Making Amount and/or Complexity of Data Reviewed Labs: ordered. Radiology: ordered.   This patient presents to the ED for concern of abdominal pain, this involves an extensive number of treatment options, and is a complaint that carries with it a high risk of complications and morbidity.  The differential diagnosis includes but is not limited to cholecystitis, cholelithiasis, pancreatitis, gastritis, peptic ulcer disease, appendicitis, bowel obstruction, bowel perforation, diverticulitis, AAA, ischemic bowel    Comorbidities that complicate the patient evaluation: Patient's presentation is complicated by their history of obesity, hypertension   Additional history obtained: Additional history obtained from EMS  Records reviewed  oncology notes reviewed  Lab Tests: I Ordered, and personally interpreted labs.  The pertinent results include:  labs unremarkable  Reevaluation: After the interventions noted above, I reevaluated the patient and found that they have :improved  Complexity of problems addressed: Patient's presentation is most consistent with  acute presentation with potential threat to life or bodily function            Final Clinical Impression(s) / ED Diagnoses Final diagnoses:  None    Rx / DC Orders ED Discharge Orders     None         Zadie Rhine, MD 07/07/22 [phone]

## 2022-07-16 ENCOUNTER — Ambulatory Visit: Payer: No Typology Code available for payment source | Admitting: Gastroenterology

## 2022-09-04 HISTORY — PX: SKIN TAG REMOVAL: SHX780

## 2022-09-05 ENCOUNTER — Ambulatory Visit (INDEPENDENT_AMBULATORY_CARE_PROVIDER_SITE_OTHER): Payer: No Typology Code available for payment source | Admitting: Physician Assistant

## 2022-09-05 ENCOUNTER — Telehealth: Payer: Self-pay

## 2022-09-05 ENCOUNTER — Encounter: Payer: Self-pay | Admitting: Physician Assistant

## 2022-09-05 VITALS — BP 118/68 | HR 66 | Ht 67.0 in | Wt 225.5 lb

## 2022-09-05 DIAGNOSIS — R09A2 Foreign body sensation, throat: Secondary | ICD-10-CM | POA: Diagnosis not present

## 2022-09-05 DIAGNOSIS — Z86711 Personal history of pulmonary embolism: Secondary | ICD-10-CM

## 2022-09-05 DIAGNOSIS — Z1211 Encounter for screening for malignant neoplasm of colon: Secondary | ICD-10-CM | POA: Diagnosis not present

## 2022-09-05 DIAGNOSIS — R195 Other fecal abnormalities: Secondary | ICD-10-CM | POA: Diagnosis not present

## 2022-09-05 DIAGNOSIS — K219 Gastro-esophageal reflux disease without esophagitis: Secondary | ICD-10-CM

## 2022-09-05 DIAGNOSIS — Z7901 Long term (current) use of anticoagulants: Secondary | ICD-10-CM

## 2022-09-05 MED ORDER — NA SULFATE-K SULFATE-MG SULF 17.5-3.13-1.6 GM/177ML PO SOLN: 1.0000 | Freq: Once | ORAL | 0 refills | Status: AC

## 2022-09-05 NOTE — Progress Notes (Signed)
Attending Physician's Attestation   I have reviewed the chart.   I agree with the Advanced Practitioner's note, impression, and recommendations with any updates as below. Agree with obtaining clearance from an anticoagulation perspective.    Corliss Parish, MD [facility] Advanced Endoscopy Office # [phone]

## 2022-09-05 NOTE — Patient Instructions (Addendum)
You have been scheduled for an endoscopy and colonoscopy. We have set you up for a free pre-visit with the nurse to go over your instructions. Please pick up your prep supplies at the pharmacy within the next 1-3 days. If you use inhalers (even only as needed), please bring them with you on the day of your procedure.  You will be contaced by our office prior to your procedure for directions on holding your Eliquis.  If you do not hear from our office 1 week prior to your scheduled procedure, please call [phone] to discuss.  I appreciate the opportunity to care for you. Hyacinth Meeker PA-C

## 2022-09-05 NOTE — Telephone Encounter (Signed)
Anti-coag letter faxed to the [facility] at fax # [phone]. I called Mr Defranco and he said his [place] [facility] does the Eliquis. Will await their response.

## 2022-09-05 NOTE — Progress Notes (Signed)
Chief Complaint: Discuss colonoscopy  HPI:    Mr. Curtis Bell is a 72 year old male with a past medical history of COPD, CHF with echo 04/19/2022 LVEF 55-60% and multiple others listed below including chronic anticoagulation with Eliquis, known to Dr. Meridee Score, who was referred to me by Gwenyth Bender, MD for consideration of a colonoscopy.    07/28/2014 colonoscopy by Dr. Janae Bridgeman guide for personal history of colon polyps with a colonoscopy prior in May 2013 with external and internal hemorrhoids, diverticulosis in the sigmoid and descending colon otherwise normal.  Repeat recommended in 5 years.    04/15/2022 patient seen in clinic by Mike Gip for positive fit test.  At that time had been in the ER in November with acute abdominal pain and CT showing low-grade partial small bowel obstruction.  Also describes intermittent left lower pain at that time.  Was recommended to be scheduled for colonoscopy with Dr. Meridee Score.    February patient diagnosed with pulmonary embolism.  He was started on Eliquis.  Recommended we wait at least 6 months to schedule colonoscopy so that he can hold this.  He was called and this was discussed but he also said he may need an endoscopy.    07/07/2022 patient seen in the ER for lower abdominal pain.  He had Hemoccult positive stool at the time.  CBC, lipase and PT/INR normal.  CMP with minimally elevated glucose of 118 otherwise normal.  CTAP at that time with no evidence of obstruction.  There are a few subcentimeter low-density foci in the liver suggesting cysts or hemangiomas.  Also diffuse wall thickening of the urinary bladder which may be due to chronic cystitis or chronic bladder outlet obstruction.    Today, patient presents to clinic and tells me that he is here to reschedule his colonoscopy.  He would also like to talk about an endoscopy given that he always feels like "something is in my throat".  He tells me he has had this feeling for a couple of years and now and  feels like he has to clear his throat constantly in order to move something.  Sometimes feels like things get stuck in a "pocket in there".  Does tell me he has some sinus issues and is not sure if drainage is contributing but also relates some hoarseness.  He is on Pantoprazole 40 daily and Pepcid 20 mg daily and is not sure if it has helped at all.    Does tell me about the PE in February and being started on Eliquis.    Denies fever, chills, weight loss or blood in his stool.  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) 04/20/2019   Chronic fatigue syndrome    Chronic pansinusitis 04/13/2017   COPD (chronic obstructive pulmonary disease) (HCC)    Deviated nasal septum 06/04/2017   Hypertension    Laryngopharyngeal reflux (LPR) 01/24/2019   Osteoporosis    Other emphysema 08/31/2017   Prostate cancer    Skin tag     Past Surgical History:  Procedure Laterality Date   ANTERIOR CRUCIATE LIGAMENT REPAIR     CATARACT EXTRACTION     HERNIA REPAIR     KNEE SURGERY     PROSTATE SURGERY     SKIN TAG REMOVAL  09/04/2022   chest and on side taken from the [place]    Current Outpatient Medications  Medication Sig Dispense Refill   acetaminophen (TYLENOL) 325 MG tablet Take 650  mg by mouth every 6 (six) hours as needed for moderate pain.     albuterol (VENTOLIN HFA) 108 (90 Base) MCG/ACT inhaler Inhale 1-2 puffs into the lungs every 6 (six) hours as needed for wheezing or shortness of breath. Patient has run out of this medication     amLODipine-olmesartan (AZOR) 10-40 MG tablet Take 1 tablet by mouth daily.     APIXABAN (ELIQUIS) VTE STARTER PACK (10MG  AND 5MG ) Take as directed on package: start with two-5mg  tablets twice daily for 7 days. On day 8, switch to one-5mg  tablet twice daily. (Patient taking differently: 5 mg 2 (two) times daily. Take as directed on package: start with two-5mg  tablets twice daily for 7 days. On day 8, switch to  one-5mg  tablet twice daily.) 1 each 0   aspirin 81 MG tablet Take 81 mg by mouth daily.       budesonide-formoterol (SYMBICORT) 160-4.5 MCG/ACT inhaler Inhale 2 puffs into the lungs 2 (two) times daily. 1 Inhaler 3   carvedilol (COREG) 12.5 MG tablet 12.5 mg. twice daily  3   cetirizine (ZYRTEC) 10 MG tablet Take 10 mg by mouth daily.     ergocalciferol (VITAMIN D2) 1.25 MG (50000 UT) capsule Take 50,000 Units by mouth once a week.     famotidine (PEPCID) 40 MG tablet Take 20 mg by mouth daily.     fluticasone (FLONASE) 50 MCG/ACT nasal spray Place 2 sprays into both nostrils daily. 29.7 g 1   fluticasone-salmeterol (WIXELA INHUB) 250-50 MCG/ACT AEPB Inhale 1 puff into the lungs at bedtime.     furosemide (LASIX) 40 MG tablet Take 1 tablet (40 mg total) by mouth daily. 30 tablet 0   guaifenesin (HUMIBID E) 400 MG TABS tablet Take 400 mg by mouth every 6 (six) hours as needed (mucus and cough).     ipratropium (ATROVENT) 0.06 % nasal spray Place 2 sprays into both nostrils 4 (four) times daily.     memantine (NAMENDA) 10 MG tablet Take 1 tablet (10 mg total) by mouth 2 (two) times daily. 180 tablet 3   montelukast (SINGULAIR) 10 MG tablet Take 10 mg by mouth daily.     Multiple Vitamin (MULTIVITAMIN) tablet Take 1 tablet by mouth daily.     Multiple Vitamins-Minerals (PRESERVISION AREDS 2 PO) Take 1 capsule by mouth 2 (two) times daily.     nitroGLYCERIN (NITROSTAT) 0.4 MG SL tablet 1 under tongue every 5 mins x3 if needed for chest pain  3   pantoprazole (PROTONIX) 40 MG tablet Take 40 mg by mouth daily.     Respiratory Therapy Supplies (FLUTTER) DEVI Use as directed. 1 each 0   rosuvastatin (CRESTOR) 20 MG tablet Take 10 mg by mouth daily.     tamsulosin (FLOMAX) 0.4 MG CAPS capsule Take 0.4 mg by mouth daily. Not taking. Ran out of the medication.     No current facility-administered medications for this visit.    Allergies as of 09/05/2022   (No Known Allergies)    Family History   Problem Relation Age of Onset   Memory loss Mother        Posey Rea if ever formally diagnosed with dementia   Prostate cancer Father        lived to be 42 years old   Lung disease Father    Cataracts Father    Amblyopia Neg Hx    Blindness Neg Hx    Glaucoma Neg Hx    Macular degeneration Neg Hx  Retinal detachment Neg Hx    Strabismus Neg Hx    Retinitis pigmentosa Neg Hx    Breast cancer Neg Hx    Colon cancer Neg Hx    Pancreatic cancer Neg Hx     Social History   Socioeconomic History   Marital status: Married    Spouse name: Malachi Bonds   Number of children: 2   Years of education: 12   Highest education level: High school graduate  Occupational History    Comment: retired  Tobacco Use   Smoking status: Former    Packs/day: 0.75    Years: 20.00    Additional pack years: 0.00    Total pack years: 15.00    Types: Cigarettes    Quit date: 03/31/1982    Years since quitting: 40.4   Smokeless tobacco: Never  Vaping Use   Vaping Use: Never used  Substance and Sexual Activity   Alcohol use: No   Drug use: No   Sexual activity: Not Currently  Other Topics Concern   Not on file  Social History Narrative   Left handed      Highest level of edu- HS      One story home with wife and son   Social Determinants of Health   Financial Resource Strain: Not on file  Food Insecurity: Not on file  Transportation Needs: Not on file  Physical Activity: Not on file  Stress: Not on file  Social Connections: Not on file  Intimate Partner Violence: Not on file    Review of Systems:    Constitutional: No weight loss, fever or chills Cardiovascular: No chest pain   Respiratory: No SOB  Gastrointestinal: See HPI and otherwise negative   Physical Exam:  Vital signs: BP 118/68   Pulse 66   Ht 5\' 7"  (1.702 m)   Wt 225 lb 8 oz (102.3 kg)   BMI 35.32 kg/m    Constitutional:   Pleasant overweight AA male appears to be in NAD, Well developed, Well nourished, alert and  cooperative Respiratory: Respirations even and unlabored. Lungs clear to auscultation bilaterally.   No wheezes, crackles, or rhonchi.  Cardiovascular: Normal S1, S2. No MRG. Regular rate and rhythm. No peripheral edema, cyanosis or pallor.  Gastrointestinal:  Soft, nondistended, mild generalized ttp. No rebound or guarding. Normal bowel sounds. No appreciable masses or hepatomegaly. Rectal:  Not performed.  Psychiatric: Demonstrates good judgement and reason without abnormal affect or behaviors.  RELEVANT LABS AND IMAGING: CBC    Component Value Date/Time   WBC 5.9 07/07/2022 0519   RBC 4.94 07/07/2022 0519   HGB 14.6 07/07/2022 0519   HCT 44.2 07/07/2022 0519   PLT 223 07/07/2022 0519   MCV 89.5 07/07/2022 0519   MCH 29.6 07/07/2022 0519   MCHC 33.0 07/07/2022 0519   RDW 13.0 07/07/2022 0519   LYMPHSABS 1.0 07/07/2022 0519   MONOABS 0.5 07/07/2022 0519   EOSABS 0.2 07/07/2022 0519   BASOSABS 0.0 07/07/2022 0519    CMP     Component Value Date/Time   NA 141 07/07/2022 0519   K 3.7 07/07/2022 0519   CL 104 07/07/2022 0519   CO2 28 07/07/2022 0519   GLUCOSE 113 (H) 07/07/2022 0519   BUN 14 07/07/2022 0519   CREATININE 1.01 07/07/2022 0519   CALCIUM 8.8 (L) 07/07/2022 0519   PROT 6.8 07/07/2022 0519   ALBUMIN 3.5 07/07/2022 0519   AST 20 07/07/2022 0519   ALT 23 07/07/2022 0519   ALKPHOS 69 07/07/2022  0519   BILITOT 0.6 07/07/2022 0519   GFRNONAA >60 07/07/2022 0519   GFRAA >60 08/23/2015 0930    Assessment: 1.  Positive fit test: Seen in clinic back in January and recommended colonoscopy, we will reschedule this for later in the year when he can hold his blood thinner 2.  PE on Eliquis: PE in February, started on Eliquis at the time 3.  Generalized abdominal pain: Recent CT showing wall thickening in the urinary bladder which could be contributing versus GI source 4.  Globus sensation  Plan: 1.  Rescheduled patient for an EGD and colonoscopy in the [facility] with Dr.  Meridee Score.  This will be scheduled in September to allow him at least 6 months continuous anticoagulation after his PE in February.  Did provide the patient a detailed list of risks for the procedures and he agrees to proceed. Patient is appropriate for endoscopic procedure(s) in the ambulatory ([facility]) setting.  2.  Recommend the patient hold his Eliquis for 2 days prior to time of procedure.  We will send clearance to his prescribing physician to ensure this is acceptable for him.  Did discuss increased risk of blood clot during this time. 3.  Continue Pantoprazole 40 daily and Pepcid 20 daily 4.  Patient to follow in clinic per recommendations from Dr. Meridee Score after time of procedures.  Hyacinth Meeker, PA-C [facility] 09/05/2022, 9:51 AM  Cc: Gwenyth Bender, MD

## 2022-09-15 NOTE — Telephone Encounter (Signed)
We heard back from Doyce Para PA-C  at the [place] in [place] with the following message: Patient is clear to hold Eliquis 2 days prior to procedure. Their fax # is [phone]. I called and told Dale and he wrote this down. They will go over this again at his pre-visit in August. I will have this letter scanned into epic.

## 2022-11-19 ENCOUNTER — Ambulatory Visit (AMBULATORY_SURGERY_CENTER): Payer: Medicare HMO

## 2022-11-19 VITALS — Ht 67.0 in | Wt 223.0 lb

## 2022-11-19 DIAGNOSIS — R09A2 Foreign body sensation, throat: Secondary | ICD-10-CM

## 2022-11-19 DIAGNOSIS — Z860100 Personal history of colon polyps, unspecified: Secondary | ICD-10-CM

## 2022-11-19 DIAGNOSIS — K219 Gastro-esophageal reflux disease without esophagitis: Secondary | ICD-10-CM

## 2022-11-19 MED ORDER — NA SULFATE-K SULFATE-MG SULF 17.5-3.13-1.6 GM/177ML PO SOLN: 1.0000 | Freq: Once | ORAL | 0 refills | Status: AC

## 2022-11-19 NOTE — Progress Notes (Signed)
Pre visit completed in person; Patient verified name, DOB, and address;  No egg or soy allergy known to patient; No issues known to pt with past sedation with any surgeries or procedures; Patient denies ever being told they had issues or difficulty with intubation;  No FH of Malignant Hyperthermia; Pt is not on diet pills; Pt is not on home 02;   Pt IS ON blood thinners - Eliquis is to be held x 2 days- prior to procedure- patient is aware;  Pt reports issues with constipation - does not have a bowel movement every day and reports his bowel movements are not always soft but denies having to strain to defecate; patient advised to increase oral fluids, activity, and fruits/veggies that are allowed prior to procedure;   No A fib or A flutter; Have any cardiac testing pending--NO Insurance verified during PV appt--- [employer] Pt can ambulate without assistance;  Pt denies use of chewing tobacco; Discussed diabetic/weight loss medication holds; Discussed NSAID holds; Checked BMI to be less than 50; Pt instructed to use Single[employer] or GoodRx for a price reduction on prep  Patient's chart reviewed by Cathlyn Parsons CNRA prior to previsit and patient appropriate for the [facility].  Pre visit completed and red dot placed by patient's name on their procedure day (on provider's schedule).    Instructions printed and given to patient during PV appt;

## 2022-11-24 ENCOUNTER — Encounter: Payer: Self-pay | Admitting: Gastroenterology

## 2022-12-04 ENCOUNTER — Telehealth: Payer: Self-pay | Admitting: Physician Assistant

## 2022-12-04 NOTE — Telephone Encounter (Signed)
Inbound call from patient requesting to speak in regards to prep. Please advise.   Thank you

## 2022-12-04 NOTE — Telephone Encounter (Signed)
I spoke with Curtis Bell and went over his prep and medicines to take/not take. He verbalized understanding .

## 2022-12-09 ENCOUNTER — Ambulatory Visit (AMBULATORY_SURGERY_CENTER): Payer: Medicare HMO | Admitting: Gastroenterology

## 2022-12-09 ENCOUNTER — Encounter: Payer: Self-pay | Admitting: Gastroenterology

## 2022-12-09 VITALS — BP 113/70 | HR 53 | Temp 97.3°F | Resp 18 | Ht 67.0 in | Wt 223.0 lb

## 2022-12-09 DIAGNOSIS — K259 Gastric ulcer, unspecified as acute or chronic, without hemorrhage or perforation: Secondary | ICD-10-CM

## 2022-12-09 DIAGNOSIS — K5730 Diverticulosis of large intestine without perforation or abscess without bleeding: Secondary | ICD-10-CM

## 2022-12-09 DIAGNOSIS — K229 Disease of esophagus, unspecified: Secondary | ICD-10-CM

## 2022-12-09 DIAGNOSIS — K449 Diaphragmatic hernia without obstruction or gangrene: Secondary | ICD-10-CM

## 2022-12-09 DIAGNOSIS — D120 Benign neoplasm of cecum: Secondary | ICD-10-CM | POA: Diagnosis not present

## 2022-12-09 DIAGNOSIS — K2289 Other specified disease of esophagus: Secondary | ICD-10-CM | POA: Diagnosis not present

## 2022-12-09 DIAGNOSIS — Z86010 Personal history of colonic polyps: Secondary | ICD-10-CM | POA: Diagnosis not present

## 2022-12-09 DIAGNOSIS — R09A2 Foreign body sensation, throat: Secondary | ICD-10-CM

## 2022-12-09 DIAGNOSIS — K253 Acute gastric ulcer without hemorrhage or perforation: Secondary | ICD-10-CM

## 2022-12-09 DIAGNOSIS — D123 Benign neoplasm of transverse colon: Secondary | ICD-10-CM | POA: Diagnosis not present

## 2022-12-09 DIAGNOSIS — K2970 Gastritis, unspecified, without bleeding: Secondary | ICD-10-CM | POA: Diagnosis not present

## 2022-12-09 DIAGNOSIS — Z09 Encounter for follow-up examination after completed treatment for conditions other than malignant neoplasm: Secondary | ICD-10-CM | POA: Diagnosis present

## 2022-12-09 DIAGNOSIS — K319 Disease of stomach and duodenum, unspecified: Secondary | ICD-10-CM | POA: Diagnosis not present

## 2022-12-09 DIAGNOSIS — K648 Other hemorrhoids: Secondary | ICD-10-CM

## 2022-12-09 MED ORDER — PANTOPRAZOLE SODIUM 40 MG PO TBEC
40.0000 mg | DELAYED_RELEASE_TABLET | Freq: Two times a day (BID) | ORAL | 6 refills | Status: DC
Start: 2022-12-09 — End: 2023-04-10

## 2022-12-09 MED ORDER — SODIUM CHLORIDE 0.9 % IV SOLN
500.0000 mL | Freq: Once | INTRAVENOUS | Status: DC
Start: 2022-12-09 — End: 2022-12-09

## 2022-12-09 NOTE — Patient Instructions (Addendum)
Increase Protonix to 40mg  twice daily (A Rx has been sent to your pharmacy.)  Repeat EGD/colonoscopy, rescheduled for 04/10/23 @ 10:30, arrive at 9:30  High fiber diet. Use FiberCon 1-2 tablets daily.  May restart Eliquis on 12/10/22 or 12/11/22.  Handouts on polyps, hemorrhoids,diverticulitis, gastritis, and GERD provided.   YOU HAD AN ENDOSCOPIC PROCEDURE TODAY AT THE [facility]:   Refer to the procedure report that was given to you for any specific questions about what was found during the examination.  If the procedure report does not answer your questions, please call your gastroenterologist to clarify.  If you requested that your care partner not be given the details of your procedure findings, then the procedure report has been included in a sealed envelope for you to review at your convenience later.  YOU SHOULD EXPECT: Some feelings of bloating in the abdomen. Passage of more gas than usual.  Walking can help get rid of the air that was put into your GI tract during the procedure and reduce the bloating. If you had a lower endoscopy (such as a colonoscopy or flexible sigmoidoscopy) you may notice spotting of blood in your stool or on the toilet paper. If you underwent a bowel prep for your procedure, you may not have a normal bowel movement for a few days.  Please Note:  You might notice some irritation and congestion in your nose or some drainage.  This is from the oxygen used during your procedure.  There is no need for concern and it should clear up in a day or so.  SYMPTOMS TO REPORT IMMEDIATELY:  Following lower endoscopy (colonoscopy or flexible sigmoidoscopy):  Excessive amounts of blood in the stool  Significant tenderness or worsening of abdominal pains  Swelling of the abdomen that is new, acute  Fever of 100F or higher  Following upper endoscopy (EGD)  Vomiting of blood or coffee ground material  New chest pain or pain under the shoulder blades  Painful or  persistently difficult swallowing  New shortness of breath  Fever of 100F or higher  Black, tarry-looking stools  For urgent or emergent issues, a gastroenterologist can be reached at any hour by calling [phone]. Do not use MyChart messaging for urgent concerns.    DIET:  We do recommend a small meal at first, but then you may proceed to your regular diet.  Drink plenty of fluids but you should avoid alcoholic beverages for 24 hours.  ACTIVITY:  You should plan to take it easy for the rest of today and you should NOT DRIVE or use heavy machinery until tomorrow (because of the sedation medicines used during the test).    FOLLOW UP: Our staff will call the number listed on your records the next business day following your procedure.  We will call around 7:15- 8:00 am to check on you and address any questions or concerns that you may have regarding the information given to you following your procedure. If we do not reach you, we will leave a message.     If any biopsies were taken you will be contacted by phone or by letter within the next 1-3 weeks.  Please call us at [phone] if you have not heard about the biopsies in 3 weeks.    SIGNATURES/CONFIDENTIALITY: You and/or your care partner have signed paperwork which will be entered into your electronic medical record.  These signatures attest to the fact that that the information above on your After Visit Summary  has been reviewed and is understood.  Full responsibility of the confidentiality of this discharge information lies with you and/or your care-partner.

## 2022-12-09 NOTE — Progress Notes (Signed)
Pt's states no medical or surgical changes since previsit or office visit. 

## 2022-12-09 NOTE — Op Note (Signed)
[facility] Patient Name: Curtis Bell Procedure Date: 12/09/2022 10:53 AM MRN: [id number] Endoscopist: Corliss Parish , MD, [id number] Age: 72 Referring MD:  Date of Birth: Nov 23, 1950 Gender: Male Account #: [id number] Procedure:                Colonoscopy Indications:              Surveillance: Personal history of adenomatous                            polyps on last colonoscopy > 5 years ago,                            Incidental - Positive fecal immunochemical test Medicines:                Monitored Anesthesia Care Procedure:                Pre-Anesthesia Assessment:                           - Prior to the procedure, a History and Physical                            was performed, and patient medications and                            allergies were reviewed. The patient's tolerance of                            previous anesthesia was also reviewed. The risks                            and benefits of the procedure and the sedation                            options and risks were discussed with the patient.                            All questions were answered, and informed consent                            was obtained. Prior Anticoagulants: The patient has                            taken Eliquis (apixaban), last dose was 2 days                            prior to procedure. ASA Grade Assessment: III - A                            patient with severe systemic disease. After                            reviewing the risks and benefits, the patient was  deemed in satisfactory condition to undergo the                            procedure.                           After obtaining informed consent, the colonoscope                            was passed under direct vision. Throughout the                            procedure, the patient's blood pressure, pulse, and                            oxygen saturations were monitored continuously.  The                            CF [id number] #[id number] was introduced through the anus                            and advanced to the the cecum, identified by                            appendiceal orifice and ileocecal valve. The                            colonoscopy was performed without difficulty. The                            patient tolerated the procedure. The quality of the                            bowel preparation was fair. The ileocecal valve,                            appendiceal orifice, and rectum were photographed. Scope In: 11:15:55 AM Scope Out: 11:33:40 AM Scope Withdrawal Time: 0 hours 15 minutes 2 seconds  Total Procedure Duration: 0 hours 17 minutes 45 seconds  Findings:                 The digital rectal exam findings include                            hemorrhoids. Pertinent negatives include no                            palpable rectal lesions.                           Extensive amounts of semi-liquid stool was found in                            the entire colon, interfering with visualization.  Lavage of the area was performed using copious                            amounts, resulting in clearance with fair                            visualization.                           Three sessile polyps were found in the ascending                            colon, cecum and ileocecal valve. The polyps were 2                            to 5 mm in size. These polyps were removed with a                            cold snare. Resection and retrieval were complete.                           Multiple small-mouthed diverticula were found in                            the recto-sigmoid colon, sigmoid colon and                            descending colon.                           Non-bleeding non-thrombosed external and internal                            hemorrhoids were found during retroflexion, during                            perianal exam and  during digital exam. The                            hemorrhoids were Grade II (internal hemorrhoids                            that prolapse but reduce spontaneously). Complications:            No immediate complications. Estimated Blood Loss:     Estimated blood loss was minimal. Impression:               - Preparation of the colon was fair even after                            copious lavage.                           - Hemorrhoids found on digital rectal exam.                           -  Stool in the entire examined colon.                           - Three 2 to 5 mm polyps in the ascending colon, in                            the cecum and at the ileocecal valve, removed with                            a cold snare. Resected and retrieved.                           - Diverticulosis in the recto-sigmoid colon, in the                            sigmoid colon and in the descending colon.                           - Non-bleeding non-thrombosed external and internal                            hemorrhoids. Recommendation:           - The patient will be observed post-procedure,                            until all discharge criteria are met.                           - Discharge patient to home.                           - Patient has a contact number available for                            emergencies. The signs and symptoms of potential                            delayed complications were discussed with the                            patient. Return to normal activities tomorrow.                            Written discharge instructions were provided to the                            patient.                           - High fiber diet.                           - Use FiberCon 1-2 tablets PO daily.                           -  May restart Eliquis on 9/11 PM or 9/12 AM.                           - Await pathology results.                           - Repeat colonoscopy within next 6-12  months                            because the bowel preparation was inadequate and                            smaller lesions could have been missed. Could                            consider doing this at the same time as the EGD                            recall for gastric ulcer surveillance if desired.                           - The findings and recommendations were discussed                            with the patient.                           - The findings and recommendations were discussed                            with the designated responsible adult. Corliss Parish, MD 12/09/2022 11:45:43 AM

## 2022-12-09 NOTE — Progress Notes (Signed)
GASTROENTEROLOGY PROCEDURE H&P NOTE   Primary Care Physician: Gwenyth Bender, MD  HPI: Curtis Bell is a 72 y.o. male who presents for EGD/Colonoscopy for evaluation of GERD, Globus, Dysphagia, and history of prior colon polyps and positive FIT (also on anticoagulation).  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) 04/20/2019   Chronic fatigue syndrome    Chronic pansinusitis 04/13/2017   COPD (chronic obstructive pulmonary disease) (HCC)    Deviated nasal septum 06/04/2017   Hypertension    Laryngopharyngeal reflux (LPR) 01/24/2019   Osteoporosis    Other emphysema 08/31/2017   Prostate cancer    Skin tag    Past Surgical History:  Procedure Laterality Date   ANTERIOR CRUCIATE LIGAMENT REPAIR     CATARACT EXTRACTION     HERNIA REPAIR     KNEE SURGERY     PROSTATE SURGERY     SKIN TAG REMOVAL  09/04/2022   chest and on side taken from the [place]   Current Outpatient Medications  Medication Sig Dispense Refill   acetaminophen (TYLENOL) 325 MG tablet Take 650 mg by mouth every 6 (six) hours as needed for moderate pain.     albuterol (VENTOLIN HFA) 108 (90 Base) MCG/ACT inhaler Inhale 1-2 puffs into the lungs every 6 (six) hours as needed for wheezing or shortness of breath. Patient has run out of this medication     amLODipine-olmesartan (AZOR) 10-40 MG tablet Take 1 tablet by mouth daily.     apixaban (ELIQUIS) 5 MG TABS tablet Take 5 mg by mouth 2 (two) times daily.     budesonide-formoterol (SYMBICORT) 160-4.5 MCG/ACT inhaler Inhale 2 puffs into the lungs 2 (two) times daily. 1 Inhaler 3   carvedilol (COREG) 12.5 MG tablet Take 12.5 mg by mouth 2 (two) times daily with a meal. twice daily  3   diclofenac Sodium (VOLTAREN) 1 % GEL Apply 2 g topically 4 (four) times daily.     ergocalciferol (VITAMIN D2) 1.25 MG (50000 UT) capsule Take 50,000 Units by mouth once a week.     fluticasone (FLONASE) 50 MCG/ACT nasal spray  Place 2 sprays into both nostrils daily. 29.7 g 1   fluticasone-salmeterol (WIXELA INHUB) 250-50 MCG/ACT AEPB Inhale 1 puff into the lungs at bedtime.     furosemide (LASIX) 40 MG tablet Take 1 tablet (40 mg total) by mouth daily. 30 tablet 0   ipratropium (ATROVENT) 0.06 % nasal spray Place 2 sprays into both nostrils 4 (four) times daily.     lidocaine (LIDODERM) 5 % Place 1 patch onto the skin daily.     memantine (NAMENDA) 10 MG tablet Take 1 tablet (10 mg total) by mouth 2 (two) times daily. 180 tablet 3   montelukast (SINGULAIR) 10 MG tablet Take 10 mg by mouth daily.     Multiple Vitamin (MULTIVITAMIN) tablet Take 1 tablet by mouth daily.     Multiple Vitamins-Minerals (PRESERVISION AREDS 2 PO) Take 1 capsule by mouth 2 (two) times daily.     pantoprazole (PROTONIX) 40 MG tablet Take 40 mg by mouth daily as needed.     rosuvastatin (CRESTOR) 20 MG tablet Take 10 mg by mouth daily.     triamterene-hydrochlorothiazide (DYAZIDE) 37.5-25 MG capsule Take 1 capsule by mouth daily.     cetirizine (ZYRTEC) 10 MG tablet Take 10 mg by mouth daily.     doxycycline (VIBRA-TABS) 100 MG tablet Take 100 mg by mouth 2 (two) times daily. (  Patient not taking: Reported on 12/09/2022)     famotidine (PEPCID) 40 MG tablet Take 40 mg by mouth daily as needed for heartburn or indigestion.     guaifenesin (HUMIBID E) 400 MG TABS tablet Take 400 mg by mouth every 6 (six) hours as needed (mucus and cough).     psyllium (METAMUCIL) 58.6 % packet Take 1 packet by mouth daily. (Patient not taking: Reported on 12/09/2022)     Respiratory Therapy Supplies (FLUTTER) DEVI Use as directed. 1 each 0   tamsulosin (FLOMAX) 0.4 MG CAPS capsule Take 0.4 mg by mouth daily. Not taking. Ran out of the medication. (Patient not taking: Reported on 12/09/2022)     Current Facility-Administered Medications  Medication Dose Route Frequency Provider Last Rate Last Admin   0.9 %  sodium chloride infusion  500 mL Intravenous Once  Mansouraty, Netty Starring., MD        Current Outpatient Medications:    acetaminophen (TYLENOL) 325 MG tablet, Take 650 mg by mouth every 6 (six) hours as needed for moderate pain., Disp: , Rfl:    albuterol (VENTOLIN HFA) 108 (90 Base) MCG/ACT inhaler, Inhale 1-2 puffs into the lungs every 6 (six) hours as needed for wheezing or shortness of breath. Patient has run out of this medication, Disp: , Rfl:    amLODipine-olmesartan (AZOR) 10-40 MG tablet, Take 1 tablet by mouth daily., Disp: , Rfl:    apixaban (ELIQUIS) 5 MG TABS tablet, Take 5 mg by mouth 2 (two) times daily., Disp: , Rfl:    budesonide-formoterol (SYMBICORT) 160-4.5 MCG/ACT inhaler, Inhale 2 puffs into the lungs 2 (two) times daily., Disp: 1 Inhaler, Rfl: 3   carvedilol (COREG) 12.5 MG tablet, Take 12.5 mg by mouth 2 (two) times daily with a meal. twice daily, Disp: , Rfl: 3   diclofenac Sodium (VOLTAREN) 1 % GEL, Apply 2 g topically 4 (four) times daily., Disp: , Rfl:    ergocalciferol (VITAMIN D2) 1.25 MG (50000 UT) capsule, Take 50,000 Units by mouth once a week., Disp: , Rfl:    fluticasone (FLONASE) 50 MCG/ACT nasal spray, Place 2 sprays into both nostrils daily., Disp: 29.7 g, Rfl: 1   fluticasone-salmeterol (WIXELA INHUB) 250-50 MCG/ACT AEPB, Inhale 1 puff into the lungs at bedtime., Disp: , Rfl:    furosemide (LASIX) 40 MG tablet, Take 1 tablet (40 mg total) by mouth daily., Disp: 30 tablet, Rfl: 0   ipratropium (ATROVENT) 0.06 % nasal spray, Place 2 sprays into both nostrils 4 (four) times daily., Disp: , Rfl:    lidocaine (LIDODERM) 5 %, Place 1 patch onto the skin daily., Disp: , Rfl:    memantine (NAMENDA) 10 MG tablet, Take 1 tablet (10 mg total) by mouth 2 (two) times daily., Disp: 180 tablet, Rfl: 3   montelukast (SINGULAIR) 10 MG tablet, Take 10 mg by mouth daily., Disp: , Rfl:    Multiple Vitamin (MULTIVITAMIN) tablet, Take 1 tablet by mouth daily., Disp: , Rfl:    Multiple Vitamins-Minerals (PRESERVISION AREDS 2 PO),  Take 1 capsule by mouth 2 (two) times daily., Disp: , Rfl:    pantoprazole (PROTONIX) 40 MG tablet, Take 40 mg by mouth daily as needed., Disp: , Rfl:    rosuvastatin (CRESTOR) 20 MG tablet, Take 10 mg by mouth daily., Disp: , Rfl:    triamterene-hydrochlorothiazide (DYAZIDE) 37.5-25 MG capsule, Take 1 capsule by mouth daily., Disp: , Rfl:    cetirizine (ZYRTEC) 10 MG tablet, Take 10 mg by mouth daily., Disp: , Rfl:  doxycycline (VIBRA-TABS) 100 MG tablet, Take 100 mg by mouth 2 (two) times daily. (Patient not taking: Reported on 12/09/2022), Disp: , Rfl:    famotidine (PEPCID) 40 MG tablet, Take 40 mg by mouth daily as needed for heartburn or indigestion., Disp: , Rfl:    guaifenesin (HUMIBID E) 400 MG TABS tablet, Take 400 mg by mouth every 6 (six) hours as needed (mucus and cough)., Disp: , Rfl:    psyllium (METAMUCIL) 58.6 % packet, Take 1 packet by mouth daily. (Patient not taking: Reported on 12/09/2022), Disp: , Rfl:    Respiratory Therapy Supplies (FLUTTER) DEVI, Use as directed., Disp: 1 each, Rfl: 0   tamsulosin (FLOMAX) 0.4 MG CAPS capsule, Take 0.4 mg by mouth daily. Not taking. Ran out of the medication. (Patient not taking: Reported on 12/09/2022), Disp: , Rfl:   Current Facility-Administered Medications:    0.9 %  sodium chloride infusion, 500 mL, Intravenous, Once, Mansouraty, Netty Starring., MD No Known Allergies Family History  Problem Relation Age of Onset   Memory loss Mother        Posey Rea if ever formally diagnosed with dementia   Prostate cancer Father        lived to be 58 years old   Lung disease Father    Cataracts Father    Amblyopia Neg Hx    Blindness Neg Hx    Glaucoma Neg Hx    Macular degeneration Neg Hx    Retinal detachment Neg Hx    Strabismus Neg Hx    Retinitis pigmentosa Neg Hx    Breast cancer Neg Hx    Colon cancer Neg Hx    Pancreatic cancer Neg Hx    Colon polyps Neg Hx    Stomach cancer Neg Hx    Rectal cancer Neg Hx    Esophageal cancer Neg  Hx    Social History   Socioeconomic History   Marital status: Married    Spouse name: Malachi Bonds   Number of children: 2   Years of education: 12   Highest education level: High school graduate  Occupational History    Comment: retired  Tobacco Use   Smoking status: Former    Current packs/day: 0.00    Average packs/day: 0.8 packs/day for 20.0 years (15.0 ttl pk-yrs)    Types: Cigarettes    Start date: 03/31/1962    Quit date: 03/31/1982    Years since quitting: 40.7   Smokeless tobacco: Never  Vaping Use   Vaping status: Never Used  Substance and Sexual Activity   Alcohol use: No   Drug use: No   Sexual activity: Not Currently  Other Topics Concern   Not on file  Social History Narrative   Left handed      Highest level of edu- HS      One story home with wife and son   Social Determinants of Health   Financial Resource Strain: Not on file  Food Insecurity: Not on file  Transportation Needs: Not on file  Physical Activity: Not on file  Stress: Not on file  Social Connections: Unknown (02/27/2022)   Received from [facility], [facility]   Social Network    Social Network: Not on file  Intimate Partner Violence: Unknown (02/27/2022)   Received from [employer], [facility]   HITS    Physically Hurt: Not on file    Insult or Talk Down To: Not on file    Threaten Physical Harm: Not on file  Scream or Curse: Not on file    Physical Exam: Today's Vitals   12/09/22 1034  BP: (!) 133/56  Pulse: (!) 51  Temp: (!) 97.3 F (36.3 C)  TempSrc: Temporal  SpO2: 90%  Weight: 223 lb (101.2 kg)  Height: 5\' 7"  (1.702 m)   Body mass index is 34.93 kg/m. GEN: NAD EYE: Sclerae anicteric ENT: MMM CV: Non-tachycardic GI: Soft, NT/ND NEURO:  Alert & Oriented x 3  Lab Results: No results for input(s): "WBC", "HGB", "HCT", "PLT" in the last 72 hours. BMET No results for input(s): "NA", "K", "CL", "CO2", "GLUCOSE", "BUN", "CREATININE", "CALCIUM" in the last 72  hours. LFT No results for input(s): "PROT", "ALBUMIN", "AST", "ALT", "ALKPHOS", "BILITOT", "BILIDIR", "IBILI" in the last 72 hours. PT/INR No results for input(s): "LABPROT", "INR" in the last 72 hours.   Impression / Plan: This is a 72 y.o.male who presents for EGD/Colonoscopy for evaluation of GERD, Globus, Dysphagia, and history of prior colon polyps and positive FIT (also on anticoagulation).  The risks and benefits of endoscopic evaluation/treatment were discussed with the patient and/or family; these include but are not limited to the risk of perforation, infection, bleeding, missed lesions, lack of diagnosis, severe illness requiring hospitalization, as well as anesthesia and sedation related illnesses.  The patient's history has been reviewed, patient examined, no change in status, and deemed stable for procedure.  The patient and/or family is agreeable to proceed.    Corliss Parish, MD [facility]oscopy Office # [phone]

## 2022-12-09 NOTE — Progress Notes (Signed)
Sedate, gd SR, tolerated procedure well, VSS, report to RN 

## 2022-12-09 NOTE — Op Note (Signed)
[facility] Patient Name: Curtis Bell Procedure Date: 12/09/2022 10:58 AM MRN: [id number] Endoscopist: Corliss Parish , MD, [id number] Age: 71 Referring MD:  Date of Birth: Apr 03, 1950 Gender: Male Account #: [id number] Procedure:                Upper GI endoscopy Indications:              Heartburn, Globus sensation Medicines:                Monitored Anesthesia Care Procedure:                Pre-Anesthesia Assessment:                           - Prior to the procedure, a History and Physical                            was performed, and patient medications and                            allergies were reviewed. The patient's tolerance of                            previous anesthesia was also reviewed. The risks                            and benefits of the procedure and the sedation                            options and risks were discussed with the patient.                            All questions were answered, and informed consent                            was obtained. Prior Anticoagulants: The patient has                            taken Eliquis (apixaban), last dose was 2 days                            prior to procedure. ASA Grade Assessment: III - A                            patient with severe systemic disease. After                            reviewing the risks and benefits, the patient was                            deemed in satisfactory condition to undergo the                            procedure.  After obtaining informed consent, the endoscope was                            passed under direct vision. Throughout the                            procedure, the patient's blood pressure, pulse, and                            oxygen saturations were monitored continuously. The                            GIF [id number] was introduced through the                            mouth, and advanced to the second part of duodenum.                             The upper GI endoscopy was accomplished without                            difficulty. The patient tolerated the procedure. Scope In: Scope Out: Findings:                 No gross lesions were noted in the proximal                            esophagus and in the mid esophagus.                           White nummular lesions were noted in the distal                            esophagus. Biopsies were taken with a cold forceps                            for histology to rule out Candida.                           The Z-line was irregular and was found 39 cm from                            the incisors.                           A 1 cm hiatal hernia was present.                           One non-bleeding linear gastric ulcer was found on                            the greater curvature of the stomach. The lesion  was 8 mm in largest dimension.                           Patchy moderate inflammation characterized by                            erosions, erythema and friability was found in the                            entire examined stomach. Biopsies were taken with a                            cold forceps for histology and Helicobacter pylori                            testing.                           No gross lesions were noted in the duodenal bulb,                            in the first portion of the duodenum and in the                            second portion of the duodenum. Complications:            No immediate complications. Estimated Blood Loss:     Estimated blood loss was minimal. Impression:               - No gross lesions in the proximal esophagus and in                            the mid esophagus.                           - White nummular lesions in esophageal mucosa found                            distally - biopsied to rule out Candida.                           - Z-line irregular, 39 cm from the incisors.                            - 1 cm hiatal hernia.                           - Non-bleeding gastric ulcer in greater curve of                            body. Gastritis. Biopsied.                           - No gross lesions in the duodenal bulb, in the  first portion of the duodenum and in the second                            portion of the duodenum. Recommendation:           - Proceed to scheduled colonoscopy.                           - Increase PPI to twice daily for the next 3-4                            months.                           - Await pathology results. Treat Candida if                            positive.                           - Observe patient's clinical course.                           - Repeat EGD in 3-4 months to ensure healing of                            gastric ulcer.                           - The findings and recommendations were discussed                            with the patient.                           - The findings and recommendations were discussed                            with the designated responsible adult. Corliss Parish, MD 12/09/2022 11:41:18 AM

## 2022-12-09 NOTE — Progress Notes (Signed)
Called to room to assist during endoscopic procedure.  Patient ID and intended procedure confirmed with present staff. Received instructions for my participation in the procedure from the performing physician.  

## 2022-12-10 ENCOUNTER — Telehealth: Payer: Self-pay

## 2022-12-10 NOTE — Telephone Encounter (Signed)
  Follow up Call-     12/09/2022   10:36 AM  Call back number  Post procedure Call Back phone  # [phone]  Permission to leave phone message Yes     Patient questions:  Do you have a fever, pain , or abdominal swelling? No. Pain Score  0 *  Have you tolerated food without any problems? Yes.    Have you been able to return to your normal activities? Yes.    Do you have any questions about your discharge instructions: Diet   No. Medications  No. Follow up visit  No.  Do you have questions or concerns about your Care? No.  Actions: * If pain score is 4 or above: No action needed, pain <4.

## 2022-12-11 LAB — SURGICAL PATHOLOGY

## 2022-12-15 ENCOUNTER — Encounter: Payer: Self-pay | Admitting: Gastroenterology

## 2022-12-16 ENCOUNTER — Ambulatory Visit
Admission: RE | Admit: 2022-12-16 | Discharge: 2022-12-16 | Disposition: A | Discharge status: Home / Self Care | Payer: Medicare HMO | Source: Ambulatory Visit | Attending: Internal Medicine | Admitting: Internal Medicine

## 2022-12-16 ENCOUNTER — Other Ambulatory Visit: Payer: Self-pay | Admitting: Internal Medicine

## 2022-12-16 DIAGNOSIS — R059 Cough, unspecified: Secondary | ICD-10-CM

## 2023-02-24 ENCOUNTER — Telehealth: Payer: Self-pay

## 2023-02-24 NOTE — Telephone Encounter (Signed)
RN preparing pre-endoscopic/colonoscopy chart for patient. Of note, patient has a history of chronic bilateral lower extremity DVTS and was diagnosed with acute bilateral PEs in January, 2024. Patient has been on Eliquis since this diagnosis.  RN to inquire of GI MD as to how to proceed with preparing for this patient's procedures; may need office visit and cardiac clearance.

## 2023-02-24 NOTE — Telephone Encounter (Signed)
RN preparing pre-endoscopic/colonoscopy chart for patient. Of note, patient has a history of chronic bilateral lower extremity DVTS and was diagnosed with acute bilateral PEs in January, 2024. Patient has been on Eliquis since this diagnosis.   RN to inquire of GI MD as to how to proceed with preparing for this patient's procedures; may need office visit and cardiac clearance.

## 2023-02-24 NOTE — Telephone Encounter (Signed)
See June 18 media note the ok to hold anti coag is scanned into the chart as well as documented on the phone note dated 09/05/22

## 2023-03-10 ENCOUNTER — Ambulatory Visit (AMBULATORY_SURGERY_CENTER): Payer: Medicare HMO

## 2023-03-10 VITALS — Ht 67.0 in | Wt 225.0 lb

## 2023-03-10 DIAGNOSIS — Z860100 Personal history of colon polyps, unspecified: Secondary | ICD-10-CM

## 2023-03-10 MED ORDER — NA SULFATE-K SULFATE-MG SULF 17.5-3.13-1.6 GM/177ML PO SOLN: 1.0000 | Freq: Once | ORAL | 0 refills | Status: AC

## 2023-03-10 NOTE — Progress Notes (Signed)
No egg or soy allergy known to patient  No issues known to pt with past sedation with any surgeries or procedures Patient denies ever being told they had issues or difficulty with intubation  No FH of Malignant Hyperthermia Pt is not on diet pills Pt is not on  home 02  Pt is on blood thinners Eliquis Hold for 2 days Pt denies issues with constipation  No A fib or A flutter Have any cardiac testing pending--no Pt can ambulate independently Pt denies use of chewing tobacco Discussed diabetic I weight loss medication holds Discussed NSAID holds Checked BMI Pt instructed to use [employer] or GoodRx for a price reduction on prep  Patient's chart reviewed by Cathlyn Parsons CNRA prior to previsit and patient appropriate for the [facility].  Pre visit completed and red dot placed by patient's name on their procedure day (on provider's schedule).

## 2023-04-07 ENCOUNTER — Telehealth: Payer: Self-pay | Admitting: Gastroenterology

## 2023-04-07 NOTE — Telephone Encounter (Signed)
 Spoke with patient regarding cold symptoms.  Patient understands that he needs to reschedule if fever persists or flu like symptoms become worse.  Patient will evaluate  the day prior to colonoscopy to see if he will need to reschedule.  Patient was instructed to wear a mask if productively coughing on the day of procedure.

## 2023-04-07 NOTE — Telephone Encounter (Signed)
 Inbound call from patient stating he currently has a cold. States he was seen with PCP yesterday 1/6 and tested negative for COVID. States he was given an antibiotic prescription. Requesting a call to discuss how to proceed with 1/10 procedures. Please advise, thank you.

## 2023-04-08 ENCOUNTER — Encounter: Payer: Self-pay | Admitting: Gastroenterology

## 2023-04-10 ENCOUNTER — Other Ambulatory Visit: Payer: Self-pay | Admitting: *Deleted

## 2023-04-10 ENCOUNTER — Ambulatory Visit (AMBULATORY_SURGERY_CENTER): Payer: No Typology Code available for payment source | Admitting: Gastroenterology

## 2023-04-10 ENCOUNTER — Other Ambulatory Visit (HOSPITAL_BASED_OUTPATIENT_CLINIC_OR_DEPARTMENT_OTHER): Payer: Self-pay

## 2023-04-10 ENCOUNTER — Encounter: Payer: Non-veteran care | Admitting: Gastroenterology

## 2023-04-10 ENCOUNTER — Encounter: Payer: Self-pay | Admitting: Gastroenterology

## 2023-04-10 VITALS — BP 105/63 | HR 73 | Temp 97.5°F | Resp 18 | Ht 67.0 in | Wt 225.0 lb

## 2023-04-10 DIAGNOSIS — Z1211 Encounter for screening for malignant neoplasm of colon: Secondary | ICD-10-CM | POA: Diagnosis not present

## 2023-04-10 DIAGNOSIS — Z860101 Personal history of adenomatous and serrated colon polyps: Secondary | ICD-10-CM | POA: Diagnosis not present

## 2023-04-10 DIAGNOSIS — K562 Volvulus: Secondary | ICD-10-CM | POA: Diagnosis not present

## 2023-04-10 DIAGNOSIS — K5730 Diverticulosis of large intestine without perforation or abscess without bleeding: Secondary | ICD-10-CM | POA: Diagnosis not present

## 2023-04-10 DIAGNOSIS — B3781 Candidal esophagitis: Secondary | ICD-10-CM

## 2023-04-10 DIAGNOSIS — Z8711 Personal history of peptic ulcer disease: Secondary | ICD-10-CM | POA: Diagnosis not present

## 2023-04-10 DIAGNOSIS — K222 Esophageal obstruction: Secondary | ICD-10-CM

## 2023-04-10 DIAGNOSIS — Z860100 Personal history of colon polyps, unspecified: Secondary | ICD-10-CM

## 2023-04-10 DIAGNOSIS — K449 Diaphragmatic hernia without obstruction or gangrene: Secondary | ICD-10-CM

## 2023-04-10 DIAGNOSIS — K641 Second degree hemorrhoids: Secondary | ICD-10-CM

## 2023-04-10 DIAGNOSIS — K253 Acute gastric ulcer without hemorrhage or perforation: Secondary | ICD-10-CM

## 2023-04-10 DIAGNOSIS — K2970 Gastritis, unspecified, without bleeding: Secondary | ICD-10-CM

## 2023-04-10 MED ORDER — PANTOPRAZOLE SODIUM 40 MG PO TBEC
40.0000 mg | DELAYED_RELEASE_TABLET | Freq: Every day | ORAL | 6 refills | Status: AC
  Filled 2023-04-10: qty 30, 30d supply, fill #0

## 2023-04-10 MED ORDER — SODIUM CHLORIDE 0.9 % IV SOLN: 500.0000 mL | Freq: Once | INTRAVENOUS | Status: DC

## 2023-04-10 NOTE — Patient Instructions (Addendum)
 Handouts Provided:  Diverticulosis and High Fiber Diet  RESTART Eliquis  on Saturday April 11, 2023.  DECREASE PPI to 40 mg once daily.  Use FiberCon 1-2 tablets daily.  REPEAT Colonoscopy in 3 years for surveillance.   YOU HAD AN ENDOSCOPIC PROCEDURE TODAY AT THE [facility]:   Refer to the procedure report that was given to you for any specific questions about what was found during the examination.  If the procedure report does not answer your questions, please call your gastroenterologist to clarify.  If you requested that your care partner not be given the details of your procedure findings, then the procedure report has been included in a sealed envelope for you to review at your convenience later.  YOU SHOULD EXPECT: Some feelings of bloating in the abdomen. Passage of more gas than usual.  Walking can help get rid of the air that was put into your GI tract during the procedure and reduce the bloating. If you had a lower endoscopy (such as a colonoscopy or flexible sigmoidoscopy) you may notice spotting of blood in your stool or on the toilet paper. If you underwent a bowel prep for your procedure, you may not have a normal bowel movement for a few days.  Please Note:  You might notice some irritation and congestion in your nose or some drainage.  This is from the oxygen used during your procedure.  There is no need for concern and it should clear up in a day or so.  SYMPTOMS TO REPORT IMMEDIATELY:  Following lower endoscopy (colonoscopy or flexible sigmoidoscopy):  Excessive amounts of blood in the stool  Significant tenderness or worsening of abdominal pains  Swelling of the abdomen that is new, acute  Fever of 100F or higher  Following upper endoscopy (EGD)  Vomiting of blood or coffee ground material  New chest pain or pain under the shoulder blades  Painful or persistently difficult swallowing  New shortness of breath  Fever of 100F or higher  Black,  tarry-looking stools  For urgent or emergent issues, a gastroenterologist can be reached at any hour by calling [phone]. Do not use MyChart messaging for urgent concerns.    DIET:  We do recommend a small meal at first, but then you may proceed to your regular diet.  Drink plenty of fluids but you should avoid alcoholic beverages for 24 hours.  ACTIVITY:  You should plan to take it easy for the rest of today and you should NOT DRIVE or use heavy machinery until tomorrow (because of the sedation medicines used during the test).    FOLLOW UP: Our staff will call the number listed on your records the next business day following your procedure.  We will call around 7:15- 8:00 am to check on you and address any questions or concerns that you may have regarding the information given to you following your procedure. If we do not reach you, we will leave a message.     If any biopsies were taken you will be contacted by phone or by letter within the next 1-3 weeks.  Please call us  at [phone] if you have not heard about the biopsies in 3 weeks.    SIGNATURES/CONFIDENTIALITY: You and/or your care partner have signed paperwork which will be entered into your electronic medical record.  These signatures attest to the fact that that the information above on your After Visit Summary has been reviewed and is understood.  Full responsibility of the confidentiality of  this discharge information lies with you and/or your care-partner.

## 2023-04-10 NOTE — Progress Notes (Signed)
 Called to room to assist during endoscopic procedure.  Patient ID and intended procedure confirmed with present staff. Received instructions for my participation in the procedure from the performing physician.

## 2023-04-10 NOTE — Op Note (Signed)
 [facility] Patient Name: Curtis Bell Procedure Date: 04/10/2023 10:35 AM MRN: [id number] Endoscopist: Aloha Finner , MD, [id number] Age: 73 Referring MD:  Date of Birth: Jul 27, 1950 Gender: Male Account #: [id number] Procedure:                Upper GI endoscopy Indications:              Follow-up of gastric ulcer Medicines:                Monitored Anesthesia Care Procedure:                Pre-Anesthesia Assessment:                           - Prior to the procedure, a History and Physical                            was performed, and patient medications and                            allergies were reviewed. The patient's tolerance of                            previous anesthesia was also reviewed. The risks                            and benefits of the procedure and the sedation                            options and risks were discussed with the patient.                            All questions were answered, and informed consent                            was obtained. Prior Anticoagulants: The patient has                            taken Eliquis  (apixaban ), last dose was 2 days                            prior to procedure. ASA Grade Assessment: III - A                            patient with severe systemic disease. After                            reviewing the risks and benefits, the patient was                            deemed in satisfactory condition to undergo the                            procedure.  After obtaining informed consent, the endoscope was                            passed under direct vision. Throughout the                            procedure, the patient's blood pressure, pulse, and                            oxygen saturations were monitored continuously. The                            GIF [id number] #[id number] was introduced through the                            mouth, and advanced to the second part of duodenum.                             The upper GI endoscopy was accomplished without                            difficulty. The patient tolerated the procedure. Scope In: Scope Out: Findings:                 White nummular lesions were noted in the entire                            esophagus. Biopsies were taken with a cold forceps                            for histology.                           The Z-line was regular and was found 38 cm from the                            incisors.                           A non-obstructing Schatzki ring was found at the                            gastroesophageal junction.                           A 3 cm hiatal hernia was present.                           No gross lesions were noted in the entire examined                            stomach.                           No gross lesions were noted in the duodenal bulb,  in the first portion of the duodenum and in the                            second portion of the duodenum. Complications:            No immediate complications. Estimated Blood Loss:     Estimated blood loss was minimal. Impression:               - White nummular lesions in esophageal mucosa.                            Biopsied.                           - Z-line regular, 38 cm from the incisors.                           - Non-obstructing Schatzki ring.                           - 3 cm hiatal hernia.                           - No gross lesions in the entire stomach.                           - No gross lesions in the duodenal bulb, in the                            first portion of the duodenum and in the second                            portion of the duodenum. Recommendation:           - Proceed to scheduled colonoscopy.                           - Can consider decreasing PPI to 40 mg once daily                            if patient's symptoms are otherwise controlled.                           - Observe patient's  clinical course.                           - Await pathology results.                           - The findings and recommendations were discussed                            with the patient.                           - The findings and recommendations were discussed  with the patient's family. Aloha Finner, MD 04/10/2023 11:14:08 AM

## 2023-04-10 NOTE — Progress Notes (Signed)
 Report to PACU, RN, vss, BBS= Clear.

## 2023-04-10 NOTE — Progress Notes (Signed)
 GASTROENTEROLOGY PROCEDURE H&P NOTE   Primary Care Physician: Addie Camellia CROME, MD  HPI: Curtis Bell is a 73 y.o. male who presents for EGD/Colonoscopy for followup of gastric ulcer healing and inadequate preparation on last colonoscopy with known history of TAs.  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) 04/20/2019   Chronic fatigue syndrome    Chronic pansinusitis 04/13/2017   COPD (chronic obstructive pulmonary disease) (HCC)    Deviated nasal septum 06/04/2017   GERD (gastroesophageal reflux disease)    Hyperlipidemia    Hypertension    Laryngopharyngeal reflux (LPR) 01/24/2019   Osteoporosis    Other emphysema 08/31/2017   Prostate cancer    Skin tag    Past Surgical History:  Procedure Laterality Date   ANTERIOR CRUCIATE LIGAMENT REPAIR     CATARACT EXTRACTION     HERNIA REPAIR     KNEE SURGERY     PROSTATE SURGERY     SKIN TAG REMOVAL  09/04/2022   chest and on side taken from the [place]   Current Outpatient Medications  Medication Sig Dispense Refill   acetaminophen  (TYLENOL ) 325 MG tablet Take 650 mg by mouth every 6 (six) hours as needed for moderate pain.     albuterol  (VENTOLIN  HFA) 108 (90 Base) MCG/ACT inhaler Inhale 1-2 puffs into the lungs every 6 (six) hours as needed for wheezing or shortness of breath. Patient has run out of this medication     amLODipine-olmesartan (AZOR) 10-40 MG tablet Take 1 tablet by mouth daily.     apixaban  (ELIQUIS ) 5 MG TABS tablet Take 5 mg by mouth 2 (two) times daily.     budesonide -formoterol  (SYMBICORT ) 160-4.5 MCG/ACT inhaler Inhale 2 puffs into the lungs 2 (two) times daily. 1 Inhaler 3   carvedilol  (COREG ) 12.5 MG tablet Take 12.5 mg by mouth 2 (two) times daily with a meal. twice daily  3   cetirizine (ZYRTEC) 10 MG tablet Take 10 mg by mouth daily.     diclofenac Sodium (VOLTAREN) 1 % GEL Apply 2 g topically 4 (four) times daily.     doxycycline  (VIBRA-TABS) 100 MG tablet Take 100 mg by mouth 2 (two) times daily. (Patient not taking: Reported on 12/09/2022)     ergocalciferol (VITAMIN D2) 1.25 MG (50000 UT) capsule Take 50,000 Units by mouth once a week.     famotidine  (PEPCID ) 40 MG tablet Take 40 mg by mouth daily as needed for heartburn or indigestion.     fluticasone  (FLONASE ) 50 MCG/ACT nasal spray Place 2 sprays into both nostrils daily. 29.7 g 1   fluticasone -salmeterol (WIXELA INHUB) 250-50 MCG/ACT AEPB Inhale 1 puff into the lungs at bedtime.     furosemide  (LASIX ) 40 MG tablet Take 1 tablet (40 mg total) by mouth daily. 30 tablet 0   guaifenesin  (HUMIBID E) 400 MG TABS tablet Take 400 mg by mouth every 6 (six) hours as needed (mucus and cough).     ipratropium (ATROVENT) 0.06 % nasal spray Place 2 sprays into both nostrils 4 (four) times daily.     lidocaine (LIDODERM) 5 % Place 1 patch onto the skin daily.     memantine  (NAMENDA ) 10 MG tablet Take 1 tablet (10 mg total) by mouth 2 (two) times daily. 180 tablet 3   montelukast  (SINGULAIR ) 10 MG tablet Take 10 mg by mouth daily.     Multiple Vitamin (MULTIVITAMIN) tablet Take 1 tablet by mouth daily.  Multiple Vitamins-Minerals (PRESERVISION AREDS 2 PO) Take 1 capsule by mouth 2 (two) times daily.     pantoprazole  (PROTONIX ) 40 MG tablet Take 40 mg by mouth daily as needed.     pantoprazole  (PROTONIX ) 40 MG tablet Take 1 tablet (40 mg total) by mouth 2 (two) times daily. 60 tablet 6   psyllium (METAMUCIL) 58.6 % packet Take 1 packet by mouth daily.     Respiratory Therapy Supplies (FLUTTER) DEVI Use as directed. 1 each 0   rosuvastatin  (CRESTOR ) 20 MG tablet Take 10 mg by mouth daily.     tamsulosin  (FLOMAX ) 0.4 MG CAPS capsule Take 0.4 mg by mouth daily. Not taking. Ran out of the medication. (Patient not taking: Reported on 12/09/2022)     triamterene-hydrochlorothiazide (DYAZIDE) 37.5-25 MG capsule Take 1 capsule by mouth daily. (Patient not taking: Reported on 03/10/2023)      zinc sulfate, 50mg  elemental zinc, 220 (50 Zn) MG capsule Take by mouth.     No current facility-administered medications for this visit.    Current Outpatient Medications:    acetaminophen  (TYLENOL ) 325 MG tablet, Take 650 mg by mouth every 6 (six) hours as needed for moderate pain., Disp: , Rfl:    albuterol  (VENTOLIN  HFA) 108 (90 Base) MCG/ACT inhaler, Inhale 1-2 puffs into the lungs every 6 (six) hours as needed for wheezing or shortness of breath. Patient has run out of this medication, Disp: , Rfl:    amLODipine-olmesartan (AZOR) 10-40 MG tablet, Take 1 tablet by mouth daily., Disp: , Rfl:    apixaban  (ELIQUIS ) 5 MG TABS tablet, Take 5 mg by mouth 2 (two) times daily., Disp: , Rfl:    budesonide -formoterol  (SYMBICORT ) 160-4.5 MCG/ACT inhaler, Inhale 2 puffs into the lungs 2 (two) times daily., Disp: 1 Inhaler, Rfl: 3   carvedilol  (COREG ) 12.5 MG tablet, Take 12.5 mg by mouth 2 (two) times daily with a meal. twice daily, Disp: , Rfl: 3   cetirizine (ZYRTEC) 10 MG tablet, Take 10 mg by mouth daily., Disp: , Rfl:    diclofenac Sodium (VOLTAREN) 1 % GEL, Apply 2 g topically 4 (four) times daily., Disp: , Rfl:    doxycycline (VIBRA-TABS) 100 MG tablet, Take 100 mg by mouth 2 (two) times daily. (Patient not taking: Reported on 12/09/2022), Disp: , Rfl:    ergocalciferol (VITAMIN D2) 1.25 MG (50000 UT) capsule, Take 50,000 Units by mouth once a week., Disp: , Rfl:    famotidine  (PEPCID ) 40 MG tablet, Take 40 mg by mouth daily as needed for heartburn or indigestion., Disp: , Rfl:    fluticasone  (FLONASE ) 50 MCG/ACT nasal spray, Place 2 sprays into both nostrils daily., Disp: 29.7 g, Rfl: 1   fluticasone -salmeterol (WIXELA INHUB) 250-50 MCG/ACT AEPB, Inhale 1 puff into the lungs at bedtime., Disp: , Rfl:    furosemide  (LASIX ) 40 MG tablet, Take 1 tablet (40 mg total) by mouth daily., Disp: 30 tablet, Rfl: 0   guaifenesin  (HUMIBID E) 400 MG TABS tablet, Take 400 mg by mouth every 6 (six) hours as  needed (mucus and cough)., Disp: , Rfl:    ipratropium (ATROVENT) 0.06 % nasal spray, Place 2 sprays into both nostrils 4 (four) times daily., Disp: , Rfl:    lidocaine (LIDODERM) 5 %, Place 1 patch onto the skin daily., Disp: , Rfl:    memantine  (NAMENDA ) 10 MG tablet, Take 1 tablet (10 mg total) by mouth 2 (two) times daily., Disp: 180 tablet, Rfl: 3   montelukast  (SINGULAIR ) 10 MG tablet, Take  10 mg by mouth daily., Disp: , Rfl:    Multiple Vitamin (MULTIVITAMIN) tablet, Take 1 tablet by mouth daily., Disp: , Rfl:    Multiple Vitamins-Minerals (PRESERVISION AREDS 2 PO), Take 1 capsule by mouth 2 (two) times daily., Disp: , Rfl:    pantoprazole  (PROTONIX ) 40 MG tablet, Take 40 mg by mouth daily as needed., Disp: , Rfl:    pantoprazole  (PROTONIX ) 40 MG tablet, Take 1 tablet (40 mg total) by mouth 2 (two) times daily., Disp: 60 tablet, Rfl: 6   psyllium (METAMUCIL) 58.6 % packet, Take 1 packet by mouth daily., Disp: , Rfl:    Respiratory Therapy Supplies (FLUTTER) DEVI, Use as directed., Disp: 1 each, Rfl: 0   rosuvastatin  (CRESTOR ) 20 MG tablet, Take 10 mg by mouth daily., Disp: , Rfl:    tamsulosin  (FLOMAX ) 0.4 MG CAPS capsule, Take 0.4 mg by mouth daily. Not taking. Ran out of the medication. (Patient not taking: Reported on 12/09/2022), Disp: , Rfl:    triamterene-hydrochlorothiazide (DYAZIDE) 37.5-25 MG capsule, Take 1 capsule by mouth daily. (Patient not taking: Reported on 03/10/2023), Disp: , Rfl:    zinc sulfate, 50mg  elemental zinc, 220 (50 Zn) MG capsule, Take by mouth., Disp: , Rfl:  No Known Allergies Family History  Problem Relation Age of Onset   Memory loss Mother        Kurt if ever formally diagnosed with dementia   Prostate cancer Father        lived to be 65 years old   Lung disease Father    Cataracts Father    Amblyopia Neg Hx    Blindness Neg Hx    Glaucoma Neg Hx    Macular degeneration Neg Hx    Retinal detachment Neg Hx    Strabismus Neg Hx    Retinitis  pigmentosa Neg Hx    Breast cancer Neg Hx    Colon cancer Neg Hx    Pancreatic cancer Neg Hx    Colon polyps Neg Hx    Stomach cancer Neg Hx    Rectal cancer Neg Hx    Esophageal cancer Neg Hx    Social History   Socioeconomic History   Marital status: Married    Spouse name: Meade   Number of children: 2   Years of education: 12   Highest education level: High school graduate  Occupational History    Comment: retired  Tobacco Use   Smoking status: Former    Current packs/day: 0.00    Average packs/day: 0.8 packs/day for 20.0 years (15.0 ttl pk-yrs)    Types: Cigarettes    Start date: 03/31/1962    Quit date: 03/31/1982    Years since quitting: 41.0   Smokeless tobacco: Never  Vaping Use   Vaping status: Never Used  Substance and Sexual Activity   Alcohol use: No   Drug use: No   Sexual activity: Not Currently  Other Topics Concern   Not on file  Social History Narrative   Left handed      Highest level of edu- HS      One story home with wife and son   Social Drivers of Corporate Investment Banker Strain: Not on file  Food Insecurity: Not on file  Transportation Needs: Not on file  Physical Activity: Not on file  Stress: Not on file  Social Connections: Unknown (02/27/2022)   Received from [facility], [facility]   Social Network    Social Network: Not on file  Intimate Partner Violence: Unknown (02/27/2022)   Received from [facility], [facility]   HITS    Physically Hurt: Not on file    Insult or Talk Down To: Not on file    Threaten Physical Harm: Not on file    Scream or Curse: Not on file    Physical Exam: There were no vitals filed for this visit. There is no height or weight on file to calculate BMI. GEN: NAD EYE: Sclerae anicteric ENT: MMM CV: Non-tachycardic GI: Soft, NT/ND NEURO:  Alert & Oriented x 3  Lab Results: No results for input(s): WBC, HGB, HCT, PLT in the last 72 hours. BMET No results for input(s): NA,  K, CL, CO2, GLUCOSE, BUN, CREATININE, CALCIUM  in the last 72 hours. LFT No results for input(s): PROT, ALBUMIN, AST, ALT, ALKPHOS, BILITOT, BILIDIR, IBILI in the last 72 hours. PT/INR No results for input(s): LABPROT, INR in the last 72 hours.   Impression / Plan: This is a 73 y.o.male who presents for EGD/Colonoscopy for followup of gastric ulcer healing and inadequate preparation on last colonoscopy with known history of TAs.  The risks and benefits of endoscopic evaluation/treatment were discussed with the patient and/or family; these include but are not limited to the risk of perforation, infection, bleeding, missed lesions, lack of diagnosis, severe illness requiring hospitalization, as well as anesthesia and sedation related illnesses.  The patient's history has been reviewed, patient examined, no change in status, and deemed stable for procedure.  The patient and/or family is agreeable to proceed.    Aloha Finner, MD [facility] Office # [phone]

## 2023-04-10 NOTE — Progress Notes (Signed)
 Pt's states no medical or surgical changes since previsit or office visit.

## 2023-04-10 NOTE — Op Note (Signed)
 [facility] Patient Name: Curtis Bell Procedure Date: 04/10/2023 10:32 AM MRN: [id number] Endoscopist: Aloha Finner , MD, [id number] Age: 73 Referring MD:  Date of Birth: November 22, 1950 Gender: Male Account #: [id number] Procedure:                Colonoscopy Indications:              Surveillance: Personal history of adenomatous                            polyps, inadequate prep on last colonoscopy (less                            than 1 year ago) Medicines:                Monitored Anesthesia Care Procedure:                Pre-Anesthesia Assessment:                           - Prior to the procedure, a History and Physical                            was performed, and patient medications and                            allergies were reviewed. The patient's tolerance of                            previous anesthesia was also reviewed. The risks                            and benefits of the procedure and the sedation                            options and risks were discussed with the patient.                            All questions were answered, and informed consent                            was obtained. Prior Anticoagulants: The patient has                            taken Eliquis  (apixaban ), last dose was 2 days                            prior to procedure. ASA Grade Assessment: III - A                            patient with severe systemic disease. After                            reviewing the risks and benefits, the patient was  deemed in satisfactory condition to undergo the                            procedure.                           After obtaining informed consent, the colonoscope                            was passed under direct vision. Throughout the                            procedure, the patient's blood pressure, pulse, and                            oxygen saturations were monitored continuously. The                             Olympus Scope SN [id number] was introduced through the                            anus and advanced to the the cecum, identified by                            appendiceal orifice and ileocecal valve. The                            colonoscopy was performed without difficulty. The                            patient tolerated the procedure. The quality of the                            bowel preparation was adequate. The ileocecal                            valve, appendiceal orifice, and rectum were                            photographed. Scope In: 10:52:22 AM Scope Out: 11:02:01 AM Scope Withdrawal Time: 0 hours 5 minutes 43 seconds  Total Procedure Duration: 0 hours 9 minutes 39 seconds  Findings:                 The digital rectal exam findings include                            hemorrhoids. Pertinent negatives include no                            palpable rectal lesions.                           The colon (entire examined portion) revealed  moderately excessive looping.                           Multiple small-mouthed diverticula were found in                            the recto-sigmoid colon and sigmoid colon.                           Normal mucosa was found in the entire colon                            otherwise.                           Non-bleeding non-thrombosed external and internal                            hemorrhoids were found during retroflexion, during                            perianal exam and during digital exam. The                            hemorrhoids were Grade II (internal hemorrhoids                            that prolapse but reduce spontaneously). Complications:            No immediate complications. Estimated Blood Loss:     Estimated blood loss was minimal. Impression:               - Hemorrhoids found on digital rectal exam.                           - There was significant looping of the colon.                            - Diverticulosis in the recto-sigmoid colon and in                            the sigmoid colon.                           - Normal mucosa in the entire examined colon                            otherwise.                           - Non-bleeding non-thrombosed external and internal                            hemorrhoids. Recommendation:           - The patient will be observed post-procedure,  until all discharge criteria are met.                           - Discharge patient to home.                           - Patient has a contact number available for                            emergencies. The signs and symptoms of potential                            delayed complications were discussed with the                            patient. Return to normal activities tomorrow.                            Written discharge instructions were provided to the                            patient.                           - High fiber diet.                           - Use FiberCon 1-2 tablets PO daily.                           - May restart Eliquis  on 1/11.                           - Continue present medications.                           - Repeat colonoscopy in 3 years for surveillance                            due to total number of polyps found within this                            last year on 2 procedures.                           - The findings and recommendations were discussed                            with the patient.                           - The findings and recommendations were discussed                            with the patient's family. Aloha Finner, MD 04/10/2023 11:11:19 AM

## 2023-04-13 ENCOUNTER — Telehealth: Payer: Self-pay

## 2023-04-13 NOTE — Telephone Encounter (Signed)
  Follow up Call-     04/10/2023    9:55 AM 12/09/2022   10:36 AM  Call back number  Post procedure Call Back phone  # [phone]  Permission to leave phone message Yes Yes     Patient questions:  Do you have a fever, pain , or abdominal swelling? No. Pain Score  0 *  Have you tolerated food without any problems? Yes.    Have you been able to return to your normal activities? Yes.    Do you have any questions about your discharge instructions: Diet   No. Medications  No. Follow up visit  No.  Do you have questions or concerns about your Care? No.  Actions: * If pain score is 4 or above: No action needed, pain <4.

## 2023-04-14 LAB — SURGICAL PATHOLOGY

## 2023-04-15 ENCOUNTER — Encounter: Payer: Self-pay | Admitting: Gastroenterology

## 2023-04-15 ENCOUNTER — Other Ambulatory Visit: Payer: Self-pay

## 2023-04-15 MED ORDER — FLUCONAZOLE 100 MG PO TABS: ORAL_TABLET | ORAL | 0 refills | Status: AC

## 2023-04-15 NOTE — Telephone Encounter (Signed)
 The pt has been advised and prescription has been sent.

## 2023-06-23 ENCOUNTER — Other Ambulatory Visit: Payer: Self-pay | Admitting: Internal Medicine

## 2023-06-23 ENCOUNTER — Ambulatory Visit
Admission: RE | Admit: 2023-06-23 | Discharge: 2023-06-23 | Disposition: A | Discharge status: Home / Self Care | Source: Ambulatory Visit | Attending: Internal Medicine | Admitting: Internal Medicine

## 2023-06-23 DIAGNOSIS — R059 Cough, unspecified: Secondary | ICD-10-CM

## 2023-12-02 ENCOUNTER — Other Ambulatory Visit (HOSPITAL_COMMUNITY): Payer: Self-pay | Admitting: Nurse Practitioner

## 2023-12-02 DIAGNOSIS — Z8546 Personal history of malignant neoplasm of prostate: Secondary | ICD-10-CM

## 2023-12-04 ENCOUNTER — Other Ambulatory Visit (HOSPITAL_BASED_OUTPATIENT_CLINIC_OR_DEPARTMENT_OTHER): Payer: Self-pay | Admitting: Nurse Practitioner

## 2023-12-04 DIAGNOSIS — Z0189 Encounter for other specified special examinations: Secondary | ICD-10-CM

## 2023-12-08 ENCOUNTER — Ambulatory Visit (HOSPITAL_BASED_OUTPATIENT_CLINIC_OR_DEPARTMENT_OTHER)

## 2023-12-08 ENCOUNTER — Encounter (HOSPITAL_BASED_OUTPATIENT_CLINIC_OR_DEPARTMENT_OTHER): Payer: Self-pay

## 2023-12-09 ENCOUNTER — Ambulatory Visit (HOSPITAL_COMMUNITY)
Admission: RE | Admit: 2023-12-09 | Discharge: 2023-12-09 | Disposition: A | Discharge status: Home / Self Care | Source: Ambulatory Visit | Attending: Nurse Practitioner | Admitting: Nurse Practitioner

## 2023-12-09 DIAGNOSIS — Z8546 Personal history of malignant neoplasm of prostate: Secondary | ICD-10-CM | POA: Insufficient documentation

## 2023-12-09 MED ORDER — IOHEXOL 300 MG/ML  SOLN
100.0000 mL | Freq: Once | INTRAMUSCULAR | Status: AC | PRN
  Administered 2023-12-09: 100 mL via INTRAVENOUS

## 2023-12-11 ENCOUNTER — Ambulatory Visit (HOSPITAL_BASED_OUTPATIENT_CLINIC_OR_DEPARTMENT_OTHER): Admission: RE | Admit: 2023-12-11 | Source: Ambulatory Visit

## 2023-12-25 ENCOUNTER — Other Ambulatory Visit: Payer: Self-pay | Admitting: Internal Medicine

## 2023-12-25 ENCOUNTER — Ambulatory Visit
Admission: RE | Admit: 2023-12-25 | Discharge: 2023-12-25 | Disposition: A | Discharge status: Home / Self Care | Source: Ambulatory Visit | Attending: Internal Medicine | Admitting: Internal Medicine

## 2023-12-25 DIAGNOSIS — M25552 Pain in left hip: Secondary | ICD-10-CM

## 2023-12-28 ENCOUNTER — Ambulatory Visit (HOSPITAL_BASED_OUTPATIENT_CLINIC_OR_DEPARTMENT_OTHER)
Admission: RE | Admit: 2023-12-28 | Discharge: 2023-12-28 | Disposition: A | Discharge status: Home / Self Care | Source: Ambulatory Visit | Attending: Nurse Practitioner | Admitting: Nurse Practitioner

## 2023-12-28 DIAGNOSIS — Z0189 Encounter for other specified special examinations: Secondary | ICD-10-CM | POA: Diagnosis present

## 2024-04-28 ENCOUNTER — Other Ambulatory Visit: Payer: Self-pay

## 2024-04-28 ENCOUNTER — Encounter (HOSPITAL_BASED_OUTPATIENT_CLINIC_OR_DEPARTMENT_OTHER): Payer: Self-pay | Admitting: Emergency Medicine

## 2024-04-28 ENCOUNTER — Emergency Department (HOSPITAL_BASED_OUTPATIENT_CLINIC_OR_DEPARTMENT_OTHER)
Admission: EM | Admit: 2024-04-28 | Discharge: 2024-04-28 | Disposition: A | Discharge status: Home / Self Care | Attending: Emergency Medicine | Admitting: Emergency Medicine

## 2024-04-28 DIAGNOSIS — Z7951 Long term (current) use of inhaled steroids: Secondary | ICD-10-CM | POA: Insufficient documentation

## 2024-04-28 DIAGNOSIS — J449 Chronic obstructive pulmonary disease, unspecified: Secondary | ICD-10-CM | POA: Insufficient documentation

## 2024-04-28 DIAGNOSIS — R1030 Lower abdominal pain, unspecified: Secondary | ICD-10-CM | POA: Insufficient documentation

## 2024-04-28 DIAGNOSIS — Z8546 Personal history of malignant neoplasm of prostate: Secondary | ICD-10-CM | POA: Insufficient documentation

## 2024-04-28 DIAGNOSIS — R1013 Epigastric pain: Secondary | ICD-10-CM | POA: Insufficient documentation

## 2024-04-28 DIAGNOSIS — I10 Essential (primary) hypertension: Secondary | ICD-10-CM | POA: Insufficient documentation

## 2024-04-28 DIAGNOSIS — R1010 Upper abdominal pain, unspecified: Secondary | ICD-10-CM | POA: Insufficient documentation

## 2024-04-28 DIAGNOSIS — Z7901 Long term (current) use of anticoagulants: Secondary | ICD-10-CM | POA: Insufficient documentation

## 2024-04-28 DIAGNOSIS — R197 Diarrhea, unspecified: Secondary | ICD-10-CM | POA: Insufficient documentation

## 2024-04-28 DIAGNOSIS — Z79899 Other long term (current) drug therapy: Secondary | ICD-10-CM | POA: Insufficient documentation

## 2024-04-28 DIAGNOSIS — R112 Nausea with vomiting, unspecified: Secondary | ICD-10-CM | POA: Insufficient documentation

## 2024-04-28 LAB — CBC
HCT: 48.8 % (ref 39.0–52.0)
Hemoglobin: 16.5 g/dL (ref 13.0–17.0)
MCH: 30.7 pg (ref 26.0–34.0)
MCHC: 33.8 g/dL (ref 30.0–36.0)
MCV: 90.7 fL (ref 80.0–100.0)
Platelets: 207 10*3/uL (ref 150–400)
RBC: 5.38 MIL/uL (ref 4.22–5.81)
RDW: 13.1 % (ref 11.5–15.5)
WBC: 6.8 10*3/uL (ref 4.0–10.5)
nRBC: 0 % (ref 0.0–0.2)

## 2024-04-28 LAB — COMPREHENSIVE METABOLIC PANEL WITH GFR
ALT: 19 U/L (ref 0–44)
AST: 19 U/L (ref 15–41)
Albumin: 4.3 g/dL (ref 3.5–5.0)
Alkaline Phosphatase: 87 U/L (ref 38–126)
Anion gap: 12 (ref 5–15)
BUN: 14 mg/dL (ref 8–23)
CO2: 26 mmol/L (ref 22–32)
Calcium: 9.4 mg/dL (ref 8.9–10.3)
Chloride: 103 mmol/L (ref 98–111)
Creatinine, Ser: 0.94 mg/dL (ref 0.61–1.24)
GFR, Estimated: >60 mL/min
Glucose, Bld: 117 mg/dL — ABNORMAL HIGH (ref 70–99)
Potassium: 3.9 mmol/L (ref 3.5–5.1)
Sodium: 141 mmol/L (ref 135–145)
Total Bilirubin: 0.9 mg/dL (ref 0.0–1.2)
Total Protein: 7.6 g/dL (ref 6.5–8.1)

## 2024-04-28 LAB — URINALYSIS, ROUTINE W REFLEX MICROSCOPIC
Bilirubin Urine: NEGATIVE
Glucose, UA: NEGATIVE mg/dL
Hgb urine dipstick: NEGATIVE
Ketones, ur: NEGATIVE mg/dL
Leukocytes,Ua: NEGATIVE
Nitrite: NEGATIVE
Specific Gravity, Urine: 1.025 (ref 1.005–1.030)
pH: 5.5 (ref 5.0–8.0)

## 2024-04-28 LAB — LIPASE, BLOOD: Lipase: 15 U/L (ref 11–51)

## 2024-04-28 MED ORDER — LOPERAMIDE HCL 2 MG PO CAPS
2.0000 mg | ORAL_CAPSULE | Freq: Once | ORAL | Status: AC
  Administered 2024-04-28: 2 mg via ORAL
  Filled 2024-04-28: qty 1

## 2024-04-28 NOTE — ED Provider Notes (Signed)
 " [facility] DEPARTMENT AT [facility] Provider Note   CSN: [id number] Arrival date & time: 04/28/24  1347     Patient presents with: Abdominal Pain   Curtis Bell is a 74 y.o. male.   Patient is a 74 year old male with a history of hypertension, prostate cancer status postresection, COPD, hyperlipidemia and prior hernia repair who is presenting today with complaints of nausea vomiting abdominal pain and diarrhea.  He reports all of the symptoms started last night.  He started getting intense abdominal cramping and had projectile vomiting.  He reports since the middle of the night the vomiting has ceased but he has continued to have multiple episodes of diarrhea.  He reports his abdominal pain is almost completely resolved and he was able to eat a pack of crackers before he came back and denies any vomiting and reports that his stomach feels pretty good.  He had gone to a clinic today and had reported to them that a few weeks ago he had noticed that his stool looked red but at the same time it is when he was eating beets and it resolved after he stopped eating beets.  He had a rectal exam there that showed that there was blood on the card and they sent him here for further evaluation.  He denies any bad food exposure, known sick contacts and denies any medication changes.  He has not had a fever and denies any chest pain or shortness of breath.  The history is provided by the patient.  Abdominal Pain      Prior to Admission medications  Medication Sig Start Date End Date Taking? Authorizing Provider  acetaminophen  (TYLENOL ) 325 MG tablet Take 650 mg by mouth every 6 (six) hours as needed for moderate pain.    [provider]  albuterol  (VENTOLIN  HFA) 108 (90 Base) MCG/ACT inhaler Inhale 1-2 puffs into the lungs every 6 (six) hours as needed for wheezing or shortness of breath. Patient has run out of this medication    [provider]   amLODipine-olmesartan (AZOR) 10-40 MG tablet Take 1 tablet by mouth daily.    [provider]  apixaban  (ELIQUIS ) 5 MG TABS tablet Take 5 mg by mouth 2 (two) times daily. 06/30/22   [provider]  budesonide -formoterol  (SYMBICORT ) 160-4.5 MCG/ACT inhaler Inhale 2 puffs into the lungs 2 (two) times daily. 06/05/15   Mannam, Praveen, MD  carvedilol  (COREG ) 12.5 MG tablet Take 12.5 mg by mouth 2 (two) times daily with a meal. twice daily 08/13/15   [provider]  cetirizine (ZYRTEC) 10 MG tablet Take 10 mg by mouth daily.    [provider]  diclofenac Sodium (VOLTAREN) 1 % GEL Apply 2 g topically 4 (four) times daily. 10/28/22   [provider]  doxycycline (VIBRA-TABS) 100 MG tablet Take 100 mg by mouth 2 (two) times daily. Patient not taking: Reported on 04/10/2023 11/04/22   [provider]  ergocalciferol (VITAMIN D2) 1.25 MG (50000 UT) capsule Take 50,000 Units by mouth once a week.    [provider]  famotidine  (PEPCID ) 40 MG tablet Take 40 mg by mouth daily as needed for heartburn or indigestion.    [provider]  fluticasone  (FLONASE ) 50 MCG/ACT nasal spray Place 2 sprays into both nostrils daily. 06/11/15   Mannam, Praveen, MD  fluticasone -salmeterol (WIXELA INHUB) 250-50 MCG/ACT AEPB Inhale 1 puff into the lungs at bedtime.    [provider]  furosemide  (LASIX ) 40 MG  tablet Take 1 tablet (40 mg total) by mouth daily. 04/21/22   Fairy Frames, MD  guaifenesin  (HUMIBID E) 400 MG TABS tablet Take 400 mg by mouth every 6 (six) hours as needed (mucus and cough).    [provider]  ipratropium (ATROVENT) 0.06 % nasal spray Place 2 sprays into both nostrils 4 (four) times daily.    [provider]  lidocaine (LIDODERM) 5 % Place 1 patch onto the skin daily. 06/30/22   [provider]  memantine  (NAMENDA ) 10 MG tablet Take 1 tablet (10 mg total) by mouth 2 (two) times daily. 03/11/19   Georjean Darice HERO, MD  montelukast  (SINGULAIR ) 10 MG tablet Take 10 mg by mouth daily. 06/08/19   [provider]  Multiple Vitamin (MULTIVITAMIN) tablet Take 1 tablet by mouth daily.    [provider]  Multiple Vitamins-Minerals (PRESERVISION AREDS 2 PO) Take 1 capsule by mouth 2 (two) times daily.    [provider]  pantoprazole  (PROTONIX ) 40 MG tablet Take 1 tablet (40 mg total) by mouth daily. 04/10/23   Mansouraty, Gabriel Jr., MD  psyllium (METAMUCIL) 58.6 % packet Take 1 packet by mouth daily. 12/12/21   [provider]  Respiratory Therapy Supplies (FLUTTER) DEVI Use as directed. 08/30/15   Mannam, Praveen, MD  rosuvastatin  (CRESTOR ) 20 MG tablet Take 10 mg by mouth daily.    [provider]  tamsulosin  (FLOMAX ) 0.4 MG CAPS capsule Take 0.4 mg by mouth daily. Not taking. Ran out of the medication. Patient not taking: Reported on 12/09/2022 04/06/19   [provider]  triamterene-hydrochlorothiazide (DYAZIDE) 37.5-25 MG capsule Take 1 capsule by mouth daily. Patient not taking: Reported on 04/10/2023 01/24/19   [provider]  zinc sulfate, 50mg  elemental zinc, 220 (50 Zn) MG capsule Take by mouth. 05/02/22   [provider]    Allergies: Patient has no known allergies.    Review of Systems  Gastrointestinal:  Positive for abdominal pain.    Updated Vital Signs BP 120/67 (BP Location: Right Arm)   Pulse 77   Temp 98.8 F (37.1 C)   Resp 17   SpO2 92%   Physical Exam Vitals and nursing note reviewed.  Constitutional:      General: He is not in acute distress.    Appearance: He is well-developed.  HENT:     Head: Normocephalic and atraumatic.  Eyes:     Conjunctiva/sclera: Conjunctivae normal.     Pupils: Pupils are equal, round, and reactive to light.  Cardiovascular:     Rate and Rhythm: Normal rate and regular rhythm.     Heart sounds: No murmur heard. Pulmonary:     Effort: Pulmonary effort is normal. No  respiratory distress.     Breath sounds: Normal breath sounds. No wheezing or rales.  Abdominal:     General: Bowel sounds are normal. There is no distension.     Palpations: Abdomen is soft.     Tenderness: There is no abdominal tenderness. There is no guarding or rebound.     Hernia: No hernia is present.  Musculoskeletal:        General: No tenderness. Normal range of motion.     Cervical back: Normal range of motion and neck supple.  Skin:    General: Skin is warm and dry.     Findings: No erythema or rash.  Neurological:     Mental Status: He is alert and oriented to person, place, and time.  Psychiatric:  Behavior: Behavior normal.     (all labs ordered are listed, but only abnormal results are displayed) Labs Reviewed  COMPREHENSIVE METABOLIC PANEL WITH GFR - Abnormal; Notable for the following components:      Result Value   Glucose, Bld 117 (*)    All other components within normal limits  URINALYSIS, ROUTINE W REFLEX MICROSCOPIC - Abnormal; Notable for the following components:   Protein, ur TRACE (*)    All other components within normal limits  LIPASE, BLOOD  CBC    EKG: None  Radiology: No results found.   Procedures   Medications Ordered in the ED  loperamide  (IMODIUM ) capsule 2 mg (has no administration in time range)                                    Medical Decision Making Amount and/or Complexity of Data Reviewed Labs: ordered. Decision-making details documented in ED Course.  Risk Prescription drug management.   Pt with multiple medical problems and comorbidities and presenting today with a complaint that caries a high risk for morbidity and mortality.  Here today with the above complaints.  Patient symptoms sound most classic for a viral etiology.  He is not having any abdominal pain at this time with low suspicion for cholecystitis, bowel obstruction, diverticulitis or appendicitis.  Patient denies any urinary symptoms.  Patient has  had significant diarrhea in the last 24 hours and concern for possible dehydration or AKI with electrolyte abnormalities.  Patient does report he had a Hemoccult positive stool at the clinic he was at prior to coming here however feel that that is most likely related to all the diarrhea he has had.  He is well-appearing on exam has been able to tolerate p.o. without intervention.  I independently interpreted patient's labs which show a normal lipase, CMP and CBC.  Urine without acute findings.  Based on patient's benign exam, improving symptoms.  Do not feel that he needs imaging of his abdomen at this time.  Discussed with him following back up with his regular doctor to recheck for blood in his stool at a later date when he is no longer having diarrhea.  He was given a dose of Imodium  here.  He was given return precautions.  He is comfortable with this plan at this time is stable for discharge.      Final diagnoses:  Nausea vomiting and diarrhea    ED Discharge Orders     None          Doretha Folks, MD 04/28/24 1659  "

## 2024-04-28 NOTE — ED Notes (Signed)
 Reviewed discharge instructions and home care with pt. Pt verbalized understanding and had no further questions. Pt exited ED without complications.

## 2024-04-28 NOTE — ED Triage Notes (Signed)
 Abdo pain, vomiting and blood in stool Started last night sent be doctor Upper epigastric pain and lower abdo pain  Some improvement today over last night

## 2024-04-28 NOTE — Discharge Instructions (Addendum)
 You can get Imodium  over-the-counter and take it longer if you need for diarrhea.  Make sure you are drinking plenty of fluids.  You are also given a handout on foods to eat for diarrhea.  You start having recurrent vomiting, severe abdominal pain, high fevers return to the emergency room.  Otherwise the blood in your stool was most likely from all the diarrhea you have had in the last 24 hours.  You can follow-up with your doctor to have that rechecked in the future.
